# Patient Record
Sex: Female | Born: 2000
Health system: Southern US, Community
[De-identification: ages and names within clinical notes are randomized; demographics above are authoritative.]

## PROBLEM LIST (undated history)

## (undated) DIAGNOSIS — G43909 Migraine, unspecified, not intractable, without status migrainosus: Secondary | ICD-10-CM

## (undated) DIAGNOSIS — R569 Unspecified convulsions: Secondary | ICD-10-CM

## (undated) DIAGNOSIS — N200 Calculus of kidney: Secondary | ICD-10-CM

## (undated) DIAGNOSIS — F32A Depression, unspecified: Secondary | ICD-10-CM

## (undated) DIAGNOSIS — F419 Anxiety disorder, unspecified: Secondary | ICD-10-CM

## (undated) DIAGNOSIS — G259 Extrapyramidal and movement disorder, unspecified: Secondary | ICD-10-CM

## (undated) DIAGNOSIS — I10 Essential (primary) hypertension: Secondary | ICD-10-CM

## (undated) DIAGNOSIS — T7840XA Allergy, unspecified, initial encounter: Secondary | ICD-10-CM

## (undated) DIAGNOSIS — F329 Major depressive disorder, single episode, unspecified: Secondary | ICD-10-CM

## (undated) HISTORY — DX: Extrapyramidal and movement disorder, unspecified: G25.9

## (undated) HISTORY — PX: FOOT SURGERY: SHX648

## (undated) HISTORY — PX: WISDOM TOOTH EXTRACTION: SHX21

## (undated) HISTORY — DX: Calculus of kidney: N20.0

---
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
## 2019-01-14 ENCOUNTER — Other Ambulatory Visit: Payer: Self-pay

## 2019-01-14 ENCOUNTER — Emergency Department (HOSPITAL_COMMUNITY)
Admission: EM | Admit: 2019-01-14 | Discharge: 2019-01-14 | Disposition: A | Discharge status: Home / Self Care | Payer: Medicaid Other | Attending: Emergency Medicine | Admitting: Emergency Medicine

## 2019-01-14 DIAGNOSIS — Z79899 Other long term (current) drug therapy: Secondary | ICD-10-CM | POA: Diagnosis not present

## 2019-01-14 DIAGNOSIS — L551 Sunburn of second degree: Secondary | ICD-10-CM | POA: Diagnosis not present

## 2019-01-14 DIAGNOSIS — I10 Essential (primary) hypertension: Secondary | ICD-10-CM | POA: Diagnosis not present

## 2019-01-14 DIAGNOSIS — Z7722 Contact with and (suspected) exposure to environmental tobacco smoke (acute) (chronic): Secondary | ICD-10-CM | POA: Insufficient documentation

## 2019-01-14 MED ORDER — TRAMADOL HCL 50 MG PO TABS
50.0000 mg | ORAL_TABLET | Freq: Once | ORAL | Status: AC
  Administered 2019-01-14: 50 mg via ORAL
  Filled 2019-01-14: qty 1

## 2019-01-14 MED ORDER — IBUPROFEN 800 MG PO TABS
800.0000 mg | ORAL_TABLET | Freq: Once | ORAL | Status: AC
  Administered 2019-01-14: 800 mg via ORAL
  Filled 2019-01-14: qty 1

## 2019-01-14 MED ORDER — BACITRACIN ZINC 500 UNIT/GM EX OINT: 1.0000 "application " | TOPICAL_OINTMENT | Freq: Two times a day (BID) | CUTANEOUS | 1 refills | Status: DC

## 2019-01-14 NOTE — ED Triage Notes (Addendum)
Per pt she was in the sun on Sunday and Monday and noticed Monday night that her shoulders and her back  were blistered up. Skin is red. Shoulders do have blister on them.

## 2019-01-14 NOTE — ED Provider Notes (Signed)
[facility] EMERGENCY DEPARTMENT Provider Note   CSN: [id number] Arrival date & time: 01/14/19  0021     History   Chief Complaint Chief Complaint  Patient presents with  . Sunburn    HPI Stephanie Frazier is a 18 y.o. female.     18 year old female presents to the emergency department for evaluation of sunburn.  She was in the sun on Sunday and noticed a sunburn to her chest, upper back, shoulders later in the day on Monday.  Her sunburn has been persistently painful.  She describes the discomfort as burning.  It is aggravated with contact from her clothing.  She has been using Excedrin and ibuprofen for pain without significant improvement.  Has also been applying aloe vera consistently.  Denies any drainage from her blisters; no purulence.  No recent fevers.  The history is provided by the patient. No language interpreter was used.    Past Medical History:  Diagnosis Date  . Allergy   . Anxiety   . Hypertension   . Hypertension   . Migraine   . Migraines   . Movement disorder   . Seizures ([place])    last seizure in 2009    Patient Active Problem List   Diagnosis Date Noted  . History of migraine headaches 07/09/2018  . Severe recurrent major depression with psychotic features ([place]) 08/02/2017  . Constipation   . Encounter for nasogastric (NG) tube placement   . Nephrolithiasis   . Abdominal pain 02/12/2017  . Difficulty in walking 07/24/2016  . Migraine without aura and with status migrainosus, not intractable 07/20/2015  . Episodic tension-type headache, not intractable 07/20/2015  . Mood disorder ([place]) 07/20/2015  . Insomnia 07/20/2015  . ADHD (attention deficit hyperactivity disorder) 04/09/2013    Past Surgical History:  Procedure Laterality Date  . BUNIONECTOMY Right 07/23/2016  . BUNIONECTOMY Left 02/2016     OB History   No obstetric history on file.      Home Medications    Prior to Admission medications   Medication Sig  Start Date End Date Taking? Authorizing Provider  bacitracin ointment Apply 1 application topically 2 (two) times daily. Apply to burns as prescribed. 01/14/19   Antonietta Breach, PA-C  Cyanocobalamin (VITAMIN B 12 PO) Take 500 mg by mouth daily.    [provider]  FLUoxetine (PROZAC) 10 MG capsule Take 3 capsules (30 mg total) by mouth at bedtime. 08/11/17   Starkes-Perry, Gayland Curry, FNP  fluticasone (FLONASE) 50 MCG/ACT nasal spray Place 1 spray into both nostrils daily. 07/12/18   Langston Masker B, PA-C  Garlic 1194 MG CAPS Take 1,000 mg by mouth daily.    [provider]  loratadine (CLARITIN) 10 MG tablet TK 1 T PO QD 11/26/17   [provider]  meloxicam (MOBIC) 7.5 MG tablet Take 7.5 mg by mouth at bedtime.  07/23/17   [provider]  meperidine (DEMEROL) 50 MG tablet Take 1 tablet (50 mg total) by mouth 3 (three) times daily as needed for severe pain. 04/09/18   Sheard, Myeong O, DPM  montelukast (SINGULAIR) 10 MG tablet TK 1 T PO QD IN THE EVE 11/26/17   [provider]  Multiple Vitamins-Calcium (ONE-A-DAY WOMENS PO) Take 1 tablet by mouth daily.    [provider]  naproxen (NAPROSYN) 500 MG tablet Take 1 tablet at onset of headache. May repeat x 1 in 8 hours if headache persists 12/10/17   Rockwell Germany, NP  oxymetazoline [place])  0.05 % nasal spray Place 1 spray into both nostrils 2 (two) times daily. 07/12/18   Valere Dross, Alyssa B, PA-C  PATADAY 0.2 % SOLN INT 1 GTT IN [facility] EYE ONCE D FOR 30 DAYS 11/28/17   [provider]  promethazine (PHENERGAN) 12.5 MG tablet Take 1 tablet at onset of nausea. May repeat x1 in 8 hours if nausea persists 04/09/18   Sheard, Myeong O, DPM  traZODone (DESYREL) 100 MG tablet Take 1 tablet (100 mg total) by mouth at bedtime. 08/11/17   Suella Broad, FNP    Family History Family History  Problem Relation Age of Onset  . Seizures Mother   . Depression Mother   . ADD / ADHD Brother   . Depression  Brother   . Migraines Maternal Grandmother   . Depression Maternal Grandmother   . Migraines Maternal Aunt   . Depression Maternal Aunt   . Migraines Maternal Uncle   . ADD / ADHD Cousin        Many Maternal 1st Cousins have [facility]    Social History Social History   Tobacco Use  . Smoking status: Passive Smoke Exposure - Never Smoker  . Smokeless tobacco: Never Used  . Tobacco comment: Parents smoke outside  Substance Use Topics  . Alcohol use: No    Alcohol/week: 0.0 standard drinks  . Drug use: No     Allergies   Morphine and related, Ondansetron hcl, Zofran, and Ketorolac   Review of Systems Review of Systems Ten systems reviewed and are negative for acute change, except as noted in the HPI.    Physical Exam Updated Vital Signs BP (!) 134/98 (BP Location: Right Arm)   Pulse 87   Temp 98.6 F (37 C) (Oral)   Resp 16   SpO2 100%   Physical Exam Vitals signs and nursing note reviewed.  Constitutional:      General: She is not in acute distress.    Appearance: She is well-developed. She is not diaphoretic.     Comments: Nontoxic appearing and in NAD  HENT:     Head: Normocephalic and atraumatic.  Eyes:     General: No scleral icterus.    Conjunctiva/sclera: Conjunctivae normal.  Neck:     Musculoskeletal: Normal range of motion.  Pulmonary:     Effort: Pulmonary effort is normal. No respiratory distress.     Comments: Respirations even and unlabored Musculoskeletal: Normal range of motion.  Skin:    General: Skin is warm and dry.     Coloration: Skin is not pale.     Comments: First degree sunburn to chest, upper back, proximal arms. Sunburn progresses to superficial partial thickness at the tops of bilateral shoulders with blistering. No drainage. Area is TTP.  Neurological:     Mental Status: She is alert and oriented to person, place, and time.  Psychiatric:        Behavior: Behavior normal.      ED Treatments / Results  Labs (all labs ordered  are listed, but only abnormal results are displayed) Labs Reviewed - No data to display  EKG    Radiology No results found.  Procedures Procedures (including critical care time)  Medications Ordered in ED Medications  ibuprofen (ADVIL) tablet 800 mg (has no administration in time range)  traMADol (ULTRAM) tablet 50 mg (has no administration in time range)     Initial Impression / Assessment and Plan / ED Course  I have reviewed the triage vital signs and the  nursing notes.  Pertinent labs & imaging results that were available during my care of the patient were reviewed by me and considered in my medical decision making (see chart for details).        18 year old female presents to the ED for evaluation of a sunburn.  Primarily noted to have first-degree sunburn to chest, upper back, shoulders.  There are small areas of the patient shoulders consistent with second-degree burn and blistering.  No evidence of secondary infection or cellulitis.  Will continue with outpatient supportive management.  Advised bacitracin twice daily.  Encouraged continuation of NSAIDs for pain.  Return precautions discussed and provided. Patient discharged in stable condition with no unaddressed concerns.   Final Clinical Impressions(s) / ED Diagnoses   Final diagnoses:  Sunburn of second degree    ED Discharge Orders         Ordered    bacitracin ointment  2 times daily     01/14/19 0142           Antonietta Breach, PA-C 01/14/19 0152    Merryl Hacker, MD 01/14/19 [phone]

## 2019-01-14 NOTE — ED Notes (Signed)
Patient verbalized understanding of dc instructions, vss, ambulatory with nad.   

## 2019-01-14 NOTE — Discharge Instructions (Signed)
We recommend 600 mg ibuprofen every 6 hours for management of pain.  Apply bacitracin to your burns twice a day.  You would benefit from keeping them covered to try and keep blisters intact.  Apply cool compresses for soothing.  You may also continue use of aloe.  Have your burns checked by your primary care doctor as needed.  Return to the ED for any new or concerning symptoms.

## 2019-05-09 ENCOUNTER — Other Ambulatory Visit: Payer: Self-pay

## 2019-05-09 ENCOUNTER — Emergency Department (HOSPITAL_COMMUNITY): Payer: Self-pay

## 2019-05-09 ENCOUNTER — Emergency Department (HOSPITAL_COMMUNITY)
Admission: EM | Admit: 2019-05-09 | Discharge: 2019-05-09 | Disposition: A | Discharge status: Home / Self Care | Payer: Self-pay | Attending: Emergency Medicine | Admitting: Emergency Medicine

## 2019-05-09 ENCOUNTER — Encounter (HOSPITAL_COMMUNITY): Payer: Self-pay | Admitting: Emergency Medicine

## 2019-05-09 DIAGNOSIS — Z79899 Other long term (current) drug therapy: Secondary | ICD-10-CM | POA: Insufficient documentation

## 2019-05-09 DIAGNOSIS — Z7722 Contact with and (suspected) exposure to environmental tobacco smoke (acute) (chronic): Secondary | ICD-10-CM | POA: Insufficient documentation

## 2019-05-09 DIAGNOSIS — N3001 Acute cystitis with hematuria: Secondary | ICD-10-CM | POA: Insufficient documentation

## 2019-05-09 DIAGNOSIS — I10 Essential (primary) hypertension: Secondary | ICD-10-CM | POA: Insufficient documentation

## 2019-05-09 DIAGNOSIS — R109 Unspecified abdominal pain: Secondary | ICD-10-CM

## 2019-05-09 LAB — URINALYSIS, ROUTINE W REFLEX MICROSCOPIC
Bacteria, UA: NONE SEEN
Bilirubin Urine: NEGATIVE
Glucose, UA: NEGATIVE mg/dL
Ketones, ur: 20 mg/dL — AB
Nitrite: NEGATIVE
Protein, ur: 100 mg/dL — AB
RBC / HPF: >50 RBC/hpf — ABNORMAL HIGH (ref 0–5)
Specific Gravity, Urine: 1.016 (ref 1.005–1.030)
WBC, UA: >50 WBC/hpf — ABNORMAL HIGH (ref 0–5)
pH: 6 (ref 5.0–8.0)

## 2019-05-09 LAB — CBC
HCT: 41.6 % (ref 36.0–46.0)
Hemoglobin: 14 g/dL (ref 12.0–15.0)
MCH: 30.9 pg (ref 26.0–34.0)
MCHC: 33.7 g/dL (ref 30.0–36.0)
MCV: 91.8 fL (ref 80.0–100.0)
Platelets: 202 10*3/uL (ref 150–400)
RBC: 4.53 MIL/uL (ref 3.87–5.11)
RDW: 12.6 % (ref 11.5–15.5)
WBC: 10.4 10*3/uL (ref 4.0–10.5)
nRBC: 0 % (ref 0.0–0.2)

## 2019-05-09 LAB — BASIC METABOLIC PANEL
Anion gap: 10 (ref 5–15)
BUN: 11 mg/dL (ref 6–20)
CO2: 24 mmol/L (ref 22–32)
Calcium: 9.4 mg/dL (ref 8.9–10.3)
Chloride: 102 mmol/L (ref 98–111)
Creatinine, Ser: 0.71 mg/dL (ref 0.44–1.00)
GFR calc Af Amer: >60 mL/min (ref >60)
GFR calc non Af Amer: >60 mL/min (ref >60)
Glucose, Bld: 97 mg/dL (ref 70–99)
Potassium: 4.1 mmol/L (ref 3.5–5.1)
Sodium: 136 mmol/L (ref 135–145)

## 2019-05-09 LAB — I-STAT BETA HCG BLOOD, ED (MC, WL, AP ONLY): I-stat hCG, quantitative: <5 m[IU]/mL (ref <5)

## 2019-05-09 MED ORDER — CEPHALEXIN 500 MG PO CAPS: 500.0000 mg | ORAL_CAPSULE | Freq: Two times a day (BID) | ORAL | 0 refills | Status: AC

## 2019-05-09 MED ORDER — HALOPERIDOL LACTATE 5 MG/ML IJ SOLN
5.0000 mg | Freq: Once | INTRAMUSCULAR | Status: AC
  Administered 2019-05-09: 5 mg via INTRAVENOUS
  Filled 2019-05-09: qty 1

## 2019-05-09 NOTE — ED Triage Notes (Signed)
Patient reports Friday having some blood in her urine and nausea. Woken up by left sided flank pain about 5am today.

## 2019-05-09 NOTE — Discharge Instructions (Addendum)
Take Keflex as prescribed and complete the full course.  Recheck with your primary care provider this week.  Return to ER for new or worsening symptoms.

## 2019-05-09 NOTE — ED Provider Notes (Signed)
[place] EMERGENCY DEPARTMENT Provider Note   CSN: [id number] Arrival date & time: 05/09/19  1146     History   Chief Complaint Chief Complaint  Patient presents with  . Flank Pain    HPI Stephanie Frazier is a 18 y.o. female.     18yo female presents with complaint of left flank pain onset 2 days ago with dysuria and hematuria, now with nausea. Hematuria has resolved.  Patient states pain now radiates down her left leg.  Patient has a history of renal stones, has never passed a kidney stone but was told in 2018 she had a stent in her left kidney.  Denies fevers, chills, abdominal pain, vomiting.  No other complaints or concerns.     Past Medical History:  Diagnosis Date  . Allergy   . Anxiety   . Hypertension   . Hypertension   . Migraine   . Migraines   . Movement disorder   . Seizures ([place])    last seizure in 2009    Patient Active Problem List   Diagnosis Date Noted  . History of migraine headaches 07/09/2018  . Severe recurrent major depression with psychotic features ([place]) 08/02/2017  . Constipation   . Encounter for nasogastric (NG) tube placement   . Nephrolithiasis   . Abdominal pain 02/12/2017  . Difficulty in walking 07/24/2016  . Migraine without aura and with status migrainosus, not intractable 07/20/2015  . Episodic tension-type headache, not intractable 07/20/2015  . Mood disorder ([place]) 07/20/2015  . Insomnia 07/20/2015  . ADHD (attention deficit hyperactivity disorder) 04/09/2013    Past Surgical History:  Procedure Laterality Date  . BUNIONECTOMY Right 07/23/2016  . BUNIONECTOMY Left 02/2016     OB History   No obstetric history on file.      Home Medications    Prior to Admission medications   Medication Sig Start Date End Date Taking? Authorizing Provider  bacitracin ointment Apply 1 application topically 2 (two) times daily. Apply to burns as prescribed. 01/14/19   Antonietta Breach, PA-C  cephALEXin (KEFLEX)  500 MG capsule Take 1 capsule (500 mg total) by mouth 2 (two) times daily for 5 days. 05/09/19 05/14/19  Tacy Learn, PA-C  Cyanocobalamin (VITAMIN B 12 PO) Take 500 mg by mouth daily.    [provider]  FLUoxetine (PROZAC) 10 MG capsule Take 3 capsules (30 mg total) by mouth at bedtime. 08/11/17   Starkes-Perry, Gayland Curry, FNP  fluticasone (FLONASE) 50 MCG/ACT nasal spray Place 1 spray into both nostrils daily. 07/12/18   Langston Masker B, PA-C  Garlic [id number] MG CAPS Take 1,000 mg by mouth daily.    [provider]  loratadine (CLARITIN) 10 MG tablet TK 1 T PO QD 11/26/17   [provider]  meloxicam (MOBIC) 7.5 MG tablet Take 7.5 mg by mouth at bedtime.  07/23/17   [provider]  meperidine (DEMEROL) 50 MG tablet Take 1 tablet (50 mg total) by mouth 3 (three) times daily as needed for severe pain. 04/09/18   Sheard, Myeong O, DPM  montelukast (SINGULAIR) 10 MG tablet TK 1 T PO QD IN THE EVE 11/26/17   [provider]  Multiple Vitamins-Calcium (ONE-A-DAY WOMENS PO) Take 1 tablet by mouth daily.    [provider]  naproxen (NAPROSYN) 500 MG tablet Take 1 tablet at onset of headache. May repeat x 1 in 8 hours if headache persists 12/10/17   Rockwell Germany, NP  oxymetazoline (AFRIN) 0.05 %  nasal spray Place 1 spray into both nostrils 2 (two) times daily. 07/12/18   Valere Dross, Alyssa B, PA-C  PATADAY 0.2 % SOLN INT 1 GTT IN [facility] EYE ONCE D FOR 30 DAYS 11/28/17   [provider]  promethazine (PHENERGAN) 12.5 MG tablet Take 1 tablet at onset of nausea. May repeat x1 in 8 hours if nausea persists 04/09/18   Sheard, Myeong O, DPM  traZODone (DESYREL) 100 MG tablet Take 1 tablet (100 mg total) by mouth at bedtime. 08/11/17   Suella Broad, FNP    Family History Family History  Problem Relation Age of Onset  . Seizures Mother   . Depression Mother   . ADD / ADHD Brother   . Depression Brother   . Migraines Maternal Grandmother   .  Depression Maternal Grandmother   . Migraines Maternal Aunt   . Depression Maternal Aunt   . Migraines Maternal Uncle   . ADD / ADHD Cousin        Many Maternal 1st Cousins have [facility]    Social History Social History   Tobacco Use  . Smoking status: Passive Smoke Exposure - Never Smoker  . Smokeless tobacco: Never Used  . Tobacco comment: Parents smoke outside  Substance Use Topics  . Alcohol use: No    Alcohol/week: 0.0 standard drinks  . Drug use: No     Allergies   Morphine and related, Ondansetron hcl, Zofran, and Ketorolac   Review of Systems Review of Systems  Constitutional: Negative for fever.  Respiratory: Negative for shortness of breath.   Cardiovascular: Negative for chest pain.  Gastrointestinal: Positive for nausea. Negative for abdominal pain, constipation, diarrhea and vomiting.  Genitourinary: Positive for dysuria, flank pain and hematuria. Negative for pelvic pain.  Musculoskeletal: Positive for myalgias. Negative for arthralgias, gait problem and joint swelling.  Skin: Negative for rash and wound.  Allergic/Immunologic: Negative for immunocompromised state.  Neurological: Negative for weakness.  Hematological: Negative for adenopathy.  All other systems reviewed and are negative.    Physical Exam Updated Vital Signs BP (!) 148/92 (BP Location: Right Arm)   Pulse 98   Temp 98.6 F (37 C) (Oral)   Resp 16   Ht 5\' 8"  (1.727 m)   Wt 74.4 kg   LMP 04/24/2019   SpO2 100%   BMI 24.94 kg/m   Physical Exam Vitals signs and nursing note reviewed.  Constitutional:      General: She is not in acute distress.    Appearance: She is well-developed. She is not diaphoretic.  HENT:     Head: Normocephalic and atraumatic.  Cardiovascular:     Rate and Rhythm: Normal rate and regular rhythm.     Pulses: Normal pulses.     Heart sounds: Normal heart sounds.  Pulmonary:     Effort: Pulmonary effort is normal.     Breath sounds: Normal breath sounds.   Abdominal:     Palpations: Abdomen is soft.     Tenderness: There is no abdominal tenderness. There is left CVA tenderness. There is no right CVA tenderness.  Musculoskeletal:        General: Tenderness present.     Thoracic back: She exhibits tenderness. She exhibits no bony tenderness.       Back:  Skin:    General: Skin is warm and dry.     Findings: No erythema or rash.  Neurological:     Mental Status: She is alert and oriented to person, place, and time.  Psychiatric:        Behavior: Behavior normal.      ED Treatments / Results  Labs (all labs ordered are listed, but only abnormal results are displayed) Labs Reviewed  URINALYSIS, ROUTINE W REFLEX MICROSCOPIC - Abnormal; Notable for the following components:      Result Value   APPearance HAZY (*)    Hgb urine dipstick MODERATE (*)    Ketones, ur 20 (*)    Protein, ur 100 (*)    Leukocytes,Ua LARGE (*)    RBC / HPF >50 (*)    WBC, UA >50 (*)    All other components within normal limits  CBC  BASIC METABOLIC PANEL  I-STAT BETA HCG BLOOD, ED ([facility], [facility], [facility] ONLY)    EKG None  Radiology US Renal  Result Date: 05/09/2019 CLINICAL DATA:  18 year old female with acute LEFT flank pain today. EXAM: RENAL / URINARY TRACT ULTRASOUND COMPLETE COMPARISON:  02/12/2017 CT FINDINGS: Right Kidney: Renal measurements: 10.9 x 3.7 x 4.6 cm = volume: 96 mL . Echogenicity within normal limits. No mass or hydronephrosis visualized. Left Kidney: Renal measurements: 10.9 x 5 x 4.7 cm = volume: 121 mL. Echogenicity within normal limits. No mass or hydronephrosis visualized. Bladder: Appears normal for degree of bladder distention. Normal bilateral ureteral jets noted. Other: None IMPRESSION: Unremarkable renal ultrasound. Electronically Signed   By: Margarette Canada M.D.   On: 05/09/2019 14:33    Procedures Procedures (including critical care time)  Medications Ordered in ED Medications  haloperidol lactate (HALDOL) injection 5 mg (5 mg  Intravenous Given 05/09/19 1413)     Initial Impression / Assessment and Plan / ED Course  I have reviewed the triage vital signs and the nursing notes.  Pertinent labs & imaging results that were available during my care of the patient were reviewed by me and considered in my medical decision making (see chart for details).  Clinical Course as of May 08 1453  Sun May 08, 4749  5353 18 year old female presents with complaint of left-sided flank pain x2 days with dysuria and hematuria.  On exam patient has left CVA tenderness, also left lumbar tenderness palpation without midline or bony tenderness.  Abdomen is soft and nontender.  CBC and BMP are unremarkable, hCG negative, urinalysis is positive for hemoglobin, ketones, protein, leukocytes, greater than 50 red blood cells and white blood cells.  Renal ultrasound does not show any hydronephrosis or stones. Patient was given Haldol for her nausea and pain with relief of her symptoms (allergy to Zofran and Toradol as well as morphine).  Patient will be discharged with Keflex for urinary tract infection, recommend recheck with PCP this week, return to ER for new or worsening symptoms.  Patient verbalizes understanding of discharge instructions and plan.   [LM]    Clinical Course User Index [LM] Tacy Learn, PA-C      Final Clinical Impressions(s) / ED Diagnoses   Final diagnoses:  Left flank pain  Acute cystitis with hematuria    ED Discharge Orders         Ordered    cephALEXin (KEFLEX) 500 MG capsule  2 times daily     05/09/19 1444           Roque Lias 05/09/19 1454    Virgel Manifold, MD 05/10/19 1517

## 2019-05-10 ENCOUNTER — Emergency Department (HOSPITAL_COMMUNITY)
Admission: EM | Admit: 2019-05-10 | Discharge: 2019-05-11 | Disposition: A | Discharge status: Home / Self Care | Payer: Self-pay | Attending: Emergency Medicine | Admitting: Emergency Medicine

## 2019-05-10 ENCOUNTER — Other Ambulatory Visit: Payer: Self-pay

## 2019-05-10 ENCOUNTER — Encounter (HOSPITAL_COMMUNITY): Payer: Self-pay | Admitting: *Deleted

## 2019-05-10 DIAGNOSIS — I10 Essential (primary) hypertension: Secondary | ICD-10-CM | POA: Insufficient documentation

## 2019-05-10 DIAGNOSIS — Z7722 Contact with and (suspected) exposure to environmental tobacco smoke (acute) (chronic): Secondary | ICD-10-CM | POA: Insufficient documentation

## 2019-05-10 DIAGNOSIS — M26623 Arthralgia of bilateral temporomandibular joint: Secondary | ICD-10-CM | POA: Insufficient documentation

## 2019-05-10 NOTE — ED Triage Notes (Signed)
Pt reports jaw pain that worsened after taking Keflex. Reports jaw tenses and she's unable to release her jaw to open or close without increased pain. Pt tearful

## 2019-05-11 MED ORDER — FENTANYL CITRATE (PF) 100 MCG/2ML IJ SOLN
50.0000 ug | Freq: Once | INTRAMUSCULAR | Status: AC
  Administered 2019-05-11: 50 ug via INTRAMUSCULAR
  Filled 2019-05-11: qty 2

## 2019-05-11 MED ORDER — DIAZEPAM 5 MG/ML IJ SOLN
5.0000 mg | Freq: Once | INTRAMUSCULAR | Status: AC
  Administered 2019-05-11: 5 mg via INTRAMUSCULAR
  Filled 2019-05-11: qty 2

## 2019-05-11 MED ORDER — CYCLOBENZAPRINE HCL 10 MG PO TABS: 10.0000 mg | ORAL_TABLET | Freq: Two times a day (BID) | ORAL | 0 refills | Status: DC | PRN

## 2019-05-11 MED ORDER — IBUPROFEN 800 MG PO TABS: 800.0000 mg | ORAL_TABLET | Freq: Three times a day (TID) | ORAL | 0 refills | Status: DC

## 2019-05-11 NOTE — Discharge Instructions (Signed)
Take the prescribed medication as directed.  I would continue using warm compresses on the face to help ease tension. Follow-up with your primary care doctor. If you continue having issues with your jaw, you may need to see dentist.  If you do not have one, I have printed information for our dentist on call, Dr. Haig Prophet.  Can call his office if needed. Return to the ED for new or worsening symptoms.

## 2019-05-11 NOTE — ED Notes (Signed)
Pt discharged before waste. Unable to access pt's chart in the pyxis to waste.  Waste witnessed with Rolan Bucco, RN for 82mcg Fentanyl, and 5mg  of Valium.

## 2019-05-11 NOTE — ED Provider Notes (Signed)
[place] EMERGENCY DEPARTMENT Provider Note   CSN: [id number] Arrival date & time: 05/10/19  2228     History   Chief Complaint Chief Complaint  Patient presents with  . Jaw Pain    HPI Stephanie Frazier is a 18 y.o. female.     The history is provided by the patient and medical records.     18 year old female with history of seasonal allergies, anxiety, hypertension, migraine headaches, history of TMJ, presenting to the ED with bilateral jaw pain.  States woke up this morning and her jaw was extremely stiff.  States that is alternating which side is worse than the other.  States she is barely able to open her mouth enough to eat or drink water.  States feels similar to prior episodes of TMJ but has never had it this bad before.  She was recently started on Keflex for urinary symptoms, however is not sure if that is related.  She is not had any fever or chills.  She denies any dental injury or facial trauma.  She has not had any medications at home prior to arrival.  Tetanus vaccine is UTD.  No recent wounds or other infections.  Past Medical History:  Diagnosis Date  . Allergy   . Anxiety   . Hypertension   . Hypertension   . Migraine   . Migraines   . Movement disorder   . Seizures ([place])    last seizure in 2009    Patient Active Problem List   Diagnosis Date Noted  . History of migraine headaches 07/09/2018  . Severe recurrent major depression with psychotic features ([place]) 08/02/2017  . Constipation   . Encounter for nasogastric (NG) tube placement   . Nephrolithiasis   . Abdominal pain 02/12/2017  . Difficulty in walking 07/24/2016  . Migraine without aura and with status migrainosus, not intractable 07/20/2015  . Episodic tension-type headache, not intractable 07/20/2015  . Mood disorder ([place]) 07/20/2015  . Insomnia 07/20/2015  . ADHD (attention deficit hyperactivity disorder) 04/09/2013    Past Surgical History:  Procedure Laterality  Date  . BUNIONECTOMY Right 07/23/2016  . BUNIONECTOMY Left 02/2016     OB History   No obstetric history on file.      Home Medications    Prior to Admission medications   Medication Sig Start Date End Date Taking? Authorizing Provider  bacitracin ointment Apply 1 application topically 2 (two) times daily. Apply to burns as prescribed. 01/14/19   Antonietta Breach, PA-C  cephALEXin (KEFLEX) 500 MG capsule Take 1 capsule (500 mg total) by mouth 2 (two) times daily for 5 days. 05/09/19 05/14/19  Tacy Learn, PA-C  Cyanocobalamin (VITAMIN B 12 PO) Take 500 mg by mouth daily.    [provider]  FLUoxetine (PROZAC) 10 MG capsule Take 3 capsules (30 mg total) by mouth at bedtime. 08/11/17   Starkes-Perry, Gayland Curry, FNP  fluticasone (FLONASE) 50 MCG/ACT nasal spray Place 1 spray into both nostrils daily. 07/12/18   Langston Masker B, PA-C  Garlic [id number] MG CAPS Take 1,000 mg by mouth daily.    [provider]  loratadine (CLARITIN) 10 MG tablet TK 1 T PO QD 11/26/17   [provider]  meloxicam (MOBIC) 7.5 MG tablet Take 7.5 mg by mouth at bedtime.  07/23/17   [provider]  meperidine (DEMEROL) 50 MG tablet Take 1 tablet (50 mg total) by mouth 3 (three) times daily as needed for severe pain. 04/09/18  Sheard, Myeong O, DPM  montelukast (SINGULAIR) 10 MG tablet TK 1 T PO QD IN THE EVE 11/26/17   [provider]  Multiple Vitamins-Calcium (ONE-A-DAY WOMENS PO) Take 1 tablet by mouth daily.    [provider]  naproxen (NAPROSYN) 500 MG tablet Take 1 tablet at onset of headache. May repeat x 1 in 8 hours if headache persists 12/10/17   Rockwell Germany, NP  oxymetazoline (AFRIN) 0.05 % nasal spray Place 1 spray into both nostrils 2 (two) times daily. 07/12/18   Valere Dross, Alyssa B, PA-C  PATADAY 0.2 % SOLN INT 1 GTT IN [facility] EYE ONCE D FOR 30 DAYS 11/28/17   [provider]  promethazine (PHENERGAN) 12.5 MG tablet Take 1 tablet at onset of nausea.  May repeat x1 in 8 hours if nausea persists 04/09/18   Sheard, Myeong O, DPM  traZODone (DESYREL) 100 MG tablet Take 1 tablet (100 mg total) by mouth at bedtime. 08/11/17   Suella Broad, FNP    Family History Family History  Problem Relation Age of Onset  . Seizures Mother   . Depression Mother   . ADD / ADHD Brother   . Depression Brother   . Migraines Maternal Grandmother   . Depression Maternal Grandmother   . Migraines Maternal Aunt   . Depression Maternal Aunt   . Migraines Maternal Uncle   . ADD / ADHD Cousin        Many Maternal 1st Cousins have [facility]    Social History Social History   Tobacco Use  . Smoking status: Passive Smoke Exposure - Never Smoker  . Smokeless tobacco: Never Used  . Tobacco comment: Parents smoke outside  Substance Use Topics  . Alcohol use: No    Alcohol/week: 0.0 standard drinks  . Drug use: No     Allergies   Morphine and related, Ondansetron hcl, Zofran, and Ketorolac   Review of Systems Review of Systems  HENT: Positive for dental problem.   All other systems reviewed and are negative.    Physical Exam Updated Vital Signs BP (!) 167/94 (BP Location: Right Arm)   Pulse (!) 116   Temp 98.4 F (36.9 C) (Oral)   Resp (!) 24   LMP 04/24/2019   SpO2 97%   Physical Exam Vitals signs and nursing note reviewed.  Constitutional:      Appearance: She is well-developed.     Comments: Screaming, crying  HENT:     Head: Normocephalic and atraumatic.     Mouth/Throat:     Comments: Bilateral TMJ tenderness, appears to be holding her jaw shifted to the left, is able to open mouth about 2 finger breadths but there is some popping noted of TMJ when doing so, dentition grossly normal  Opening mouth to scream during exam Eyes:     Conjunctiva/sclera: Conjunctivae normal.     Pupils: Pupils are equal, round, and reactive to light.  Neck:     Musculoskeletal: Normal range of motion.  Cardiovascular:     Rate and Rhythm:  Normal rate and regular rhythm.     Heart sounds: Normal heart sounds.  Pulmonary:     Effort: Pulmonary effort is normal.     Breath sounds: Normal breath sounds.  Abdominal:     General: Bowel sounds are normal.     Palpations: Abdomen is soft.  Musculoskeletal: Normal range of motion.  Skin:    General: Skin is warm and dry.  Neurological:     Mental Status: She  is alert and oriented to person, place, and time.  Psychiatric:     Comments: Extremely anxious appearing      ED Treatments / Results  Labs (all labs ordered are listed, but only abnormal results are displayed) Labs Reviewed - No data to display  EKG None  Radiology  Procedures Procedures (including critical care time)  Medications Ordered in ED Medications  diazepam (VALIUM) injection 5 mg (5 mg Intramuscular Given 05/11/19 0020)  fentaNYL (SUBLIMAZE) injection 50 mcg (50 mcg Intramuscular Given 05/11/19 0020)     Initial Impression / Assessment and Plan / ED Course  I have reviewed the triage vital signs and the nursing notes.  Pertinent labs & imaging results that were available during my care of the patient were reviewed by me and considered in my medical decision making (see chart for details).  18 year old female presenting to the ED with bilateral jaw pain.  States this began this morning upon waking, states severe pain alternates from side to side.  History of TMJ which felt similar but never this severe.  She is afebrile and nontoxic but is screaming and crying on exam.  She is holding her jaw with mandible shifted slightly to the left.  She is able to open her mouth at least 2 finger breaths and when screaming.  She is not having any difficulty swallowing.  She does have tenderness along the TMJ joints bilaterally.  She was recently started on Keflex, however I do not feel this is associated with her symptoms.  Her tetanus is up-to-date she has not had any recent atypical wounds.  Her behavior is  somewhat exaggerated here with a lot of noted anxiety which may be exacerbating her symptoms.  She was given dose of Valium and fentanyl, warm compresses applied to the face.  This seemed to help control her pain and she is no longer screaming or crying.  Feel she is stable for discharge home, will start on course of Flexeril and anti-inflammatories.  She will need to follow-up with her PCP.  Also given information for dentist for follow-up if ongoing jaw pain.  Return here for any new/acute changes.  Final Clinical Impressions(s) / ED Diagnoses   Final diagnoses:  Bilateral temporomandibular joint pain    ED Discharge Orders         Ordered    cyclobenzaprine (FLEXERIL) 10 MG tablet  2 times daily PRN     05/11/19 0043    ibuprofen (ADVIL) 800 MG tablet  3 times daily     05/11/19 0043           Larene Pickett, PA-C 05/11/19 0106    Ward, Delice Bison, DO 05/11/19 [phone]

## 2019-07-03 IMAGING — DX DG ABD PORTABLE 1V
1 series · 1 of 1 positions shown · non-contrast
Comparison: 02/12/2017 CT

CLINICAL DATA: Nausea, vomiting.

EXAM:
PORTABLE ABDOMEN - 1 VIEW

[abdomen]
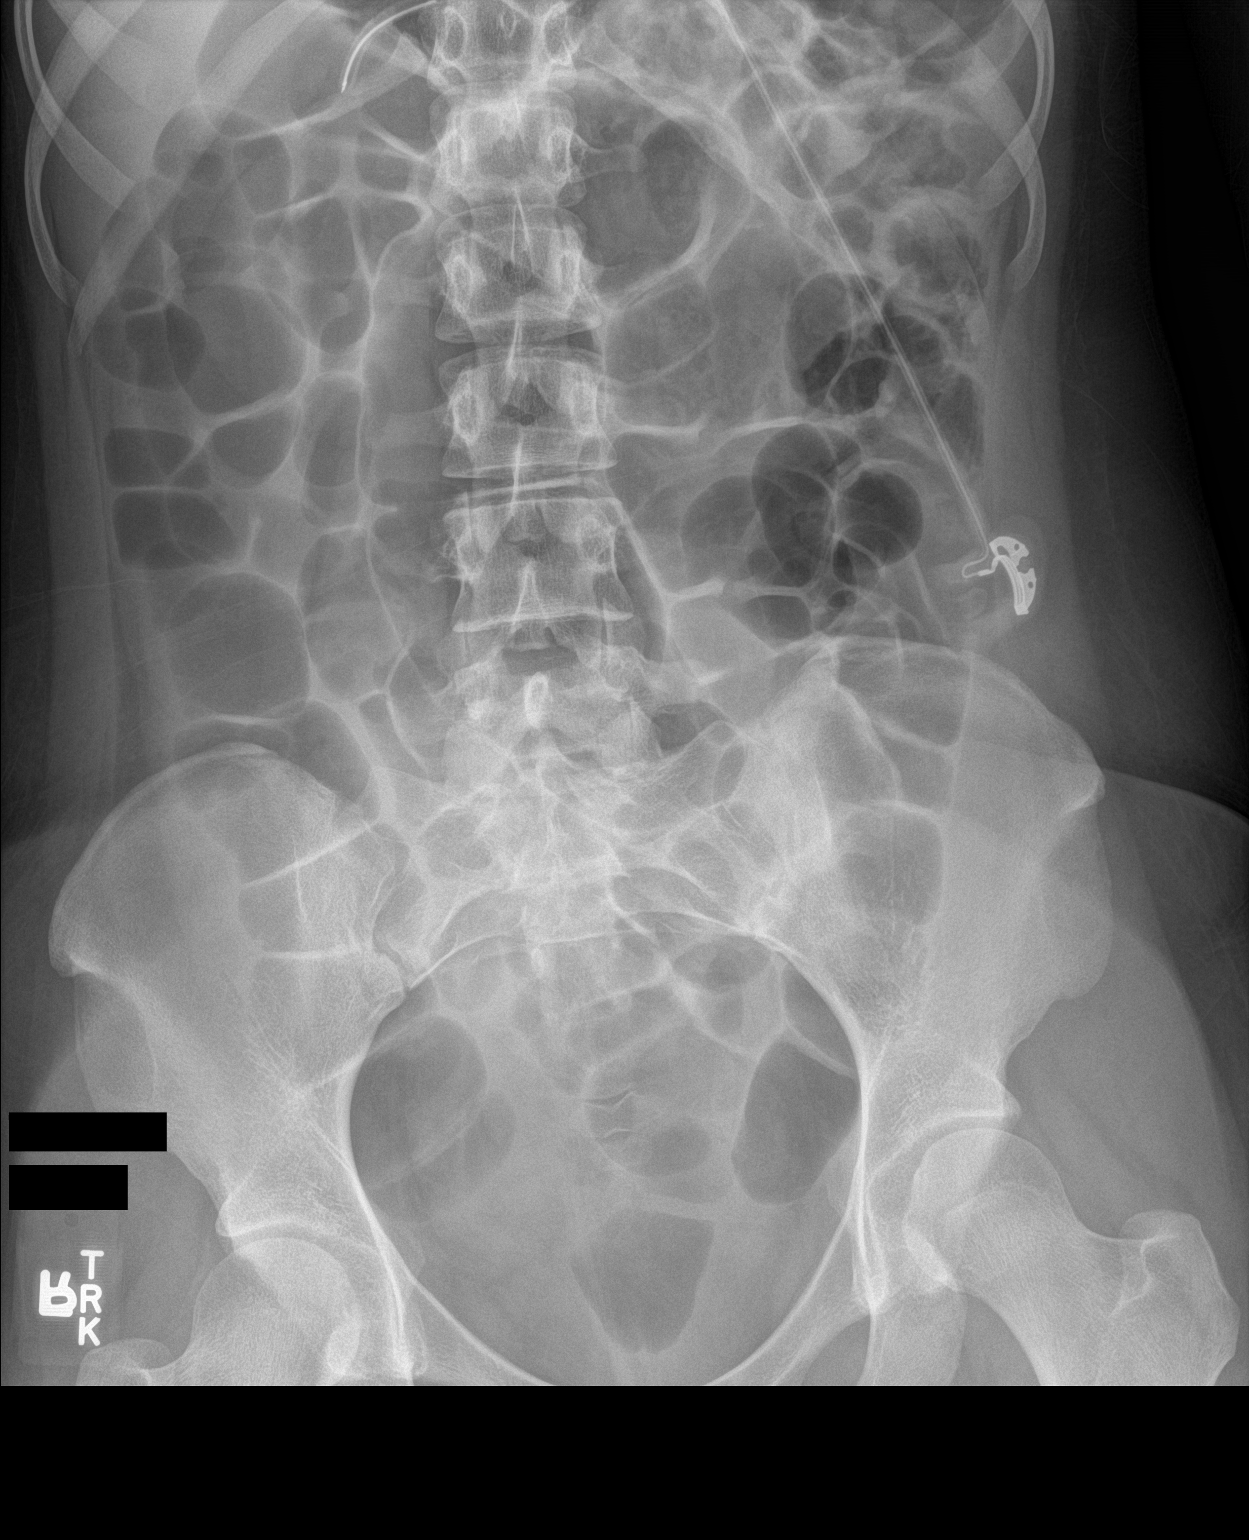

[1 of 1 positions shown; findings below may reference images not displayed]

FINDINGS: Diffuse gaseous distention of bowel. Tip of the NG tube is in the
distal stomach or proximal duodenum. No organomegaly or free air.
IMPRESSION: Diffuse gaseous distention of bowel.

## 2019-07-23 NOTE — L&D Delivery Note (Signed)
[facility]N [facility] Delivery Note  Stephanie Frazier is a 19 y.o. G1P0 s/p SVD at [redacted]w[redacted]d. She was admitted for IOL for gHTN.   ROM: 5h 9m with clear fluid GBS Status: Negative Maximum Maternal Temperature: 99.3  Labor Progress: . Admitted for IOL for gHTN, initial cervical check 1.5/50/-3. FB and buccal cytotec placed, FB out 1350. Pitocin started, AROM 1809. Fetus had early decels after AROM that resolved with amnioinfusion, terbutaline, and pitocin break. Pitocin restarted and patient progressed to complete at 2325.  Delivery Date/Time: 02/29/20 2325 Delivery: Called to room and patient was complete and pushing. Fetus noted to have recurrent variable decelerations to 60s while pushing with good recovery back to baseline, NICU team alerted and called to delivery room. Head delivered ROA. Tight nuchal cord present x 1, reduced. Shoulder and body delivered in usual fashion. Infant with spontaneous cry, placed on mother's abdomen, dried and stimulated. Cord clamped x 2 after 1-minute delay, and cut by grandmother under my direct supervision. Cord blood drawn. Placenta delivered spontaneously with gentle cord traction. Fundus firm with massage and Pitocin. Labia, perineum, vagina, and cervix were inspected. Left labial tear present, repaired with 4-0 vicryl in the usual fashion. Good hemostasis noted.   Placenta: Intact, 3 vessel cord, delivered spontaneously Complications: None Lacerations: Left labial, repaired EBL: 150 Analgesia: Epidural  Infant: Girl  APGARs 7 and 9  Weight pending   Wende Mott, MD Family Medicine PGY-3

## 2019-10-23 ENCOUNTER — Emergency Department (HOSPITAL_BASED_OUTPATIENT_CLINIC_OR_DEPARTMENT_OTHER): Payer: Medicaid Other

## 2019-10-23 ENCOUNTER — Other Ambulatory Visit: Payer: Self-pay

## 2019-10-23 ENCOUNTER — Inpatient Hospital Stay (HOSPITAL_BASED_OUTPATIENT_CLINIC_OR_DEPARTMENT_OTHER)
Admission: EM | Admit: 2019-10-23 | Discharge: 2019-10-25 | DRG: 832 | Disposition: A | Discharge status: Home / Self Care | Payer: Medicaid Other | Attending: Obstetrics and Gynecology | Admitting: Obstetrics and Gynecology

## 2019-10-23 ENCOUNTER — Encounter (HOSPITAL_BASED_OUTPATIENT_CLINIC_OR_DEPARTMENT_OTHER): Payer: Self-pay | Admitting: Emergency Medicine

## 2019-10-23 DIAGNOSIS — R109 Unspecified abdominal pain: Secondary | ICD-10-CM | POA: Diagnosis present

## 2019-10-23 DIAGNOSIS — Z87442 Personal history of urinary calculi: Secondary | ICD-10-CM | POA: Diagnosis not present

## 2019-10-23 DIAGNOSIS — Z3A21 21 weeks gestation of pregnancy: Secondary | ICD-10-CM

## 2019-10-23 DIAGNOSIS — R319 Hematuria, unspecified: Secondary | ICD-10-CM | POA: Diagnosis not present

## 2019-10-23 DIAGNOSIS — N390 Urinary tract infection, site not specified: Secondary | ICD-10-CM

## 2019-10-23 DIAGNOSIS — Z20822 Contact with and (suspected) exposure to covid-19: Secondary | ICD-10-CM | POA: Diagnosis present

## 2019-10-23 DIAGNOSIS — O26832 Pregnancy related renal disease, second trimester: Principal | ICD-10-CM | POA: Diagnosis present

## 2019-10-23 DIAGNOSIS — N23 Unspecified renal colic: Secondary | ICD-10-CM | POA: Diagnosis not present

## 2019-10-23 DIAGNOSIS — O99891 Other specified diseases and conditions complicating pregnancy: Secondary | ICD-10-CM | POA: Diagnosis not present

## 2019-10-23 DIAGNOSIS — O0932 Supervision of pregnancy with insufficient antenatal care, second trimester: Secondary | ICD-10-CM | POA: Diagnosis not present

## 2019-10-23 DIAGNOSIS — N200 Calculus of kidney: Secondary | ICD-10-CM | POA: Diagnosis present

## 2019-10-23 DIAGNOSIS — N202 Calculus of kidney with calculus of ureter: Secondary | ICD-10-CM | POA: Diagnosis present

## 2019-10-23 DIAGNOSIS — Z23 Encounter for immunization: Secondary | ICD-10-CM | POA: Diagnosis not present

## 2019-10-23 DIAGNOSIS — Z3490 Encounter for supervision of normal pregnancy, unspecified, unspecified trimester: Secondary | ICD-10-CM

## 2019-10-23 HISTORY — DX: Depression, unspecified: F32.A

## 2019-10-23 LAB — CBC WITH DIFFERENTIAL/PLATELET
Abs Immature Granulocytes: 0.04 10*3/uL (ref 0.00–0.07)
Basophils Absolute: 0 10*3/uL (ref 0.0–0.1)
Basophils Relative: 0 %
Eosinophils Absolute: 0 10*3/uL (ref 0.0–0.5)
Eosinophils Relative: 0 %
HCT: 35 % — ABNORMAL LOW (ref 36.0–46.0)
Hemoglobin: 12 g/dL (ref 12.0–15.0)
Immature Granulocytes: 0 %
Lymphocytes Relative: 13 %
Lymphs Abs: 1.5 10*3/uL (ref 0.7–4.0)
MCH: 31.6 pg (ref 26.0–34.0)
MCHC: 34.3 g/dL (ref 30.0–36.0)
MCV: 92.1 fL (ref 80.0–100.0)
Monocytes Absolute: 0.5 10*3/uL (ref 0.1–1.0)
Monocytes Relative: 4 %
Neutro Abs: 9.5 10*3/uL — ABNORMAL HIGH (ref 1.7–7.7)
Neutrophils Relative %: 83 %
Platelets: 178 10*3/uL (ref 150–400)
RBC: 3.8 MIL/uL — ABNORMAL LOW (ref 3.87–5.11)
RDW: 13.4 % (ref 11.5–15.5)
WBC: 11.5 10*3/uL — ABNORMAL HIGH (ref 4.0–10.5)
nRBC: 0 % (ref 0.0–0.2)

## 2019-10-23 LAB — COMPREHENSIVE METABOLIC PANEL
ALT: 12 U/L (ref 0–44)
AST: 15 U/L (ref 15–41)
Albumin: 3.2 g/dL — ABNORMAL LOW (ref 3.5–5.0)
Alkaline Phosphatase: 49 U/L (ref 38–126)
Anion gap: 10 (ref 5–15)
BUN: 9 mg/dL (ref 6–20)
CO2: 24 mmol/L (ref 22–32)
Calcium: 8.7 mg/dL — ABNORMAL LOW (ref 8.9–10.3)
Chloride: 102 mmol/L (ref 98–111)
Creatinine, Ser: 0.56 mg/dL (ref 0.44–1.00)
GFR calc Af Amer: >60 mL/min (ref >60)
GFR calc non Af Amer: >60 mL/min (ref >60)
Glucose, Bld: 88 mg/dL (ref 70–99)
Potassium: 3.6 mmol/L (ref 3.5–5.1)
Sodium: 136 mmol/L (ref 135–145)
Total Bilirubin: 0.5 mg/dL (ref 0.3–1.2)
Total Protein: 6.5 g/dL (ref 6.5–8.1)

## 2019-10-23 LAB — URINALYSIS, MICROSCOPIC (REFLEX)
RBC / HPF: >50 RBC/hpf (ref 0–5)
WBC, UA: >50 WBC/hpf (ref 0–5)

## 2019-10-23 LAB — SARS CORONAVIRUS 2 (TAT 6-24 HRS): SARS Coronavirus 2: NEGATIVE

## 2019-10-23 LAB — URINALYSIS, ROUTINE W REFLEX MICROSCOPIC
Bilirubin Urine: NEGATIVE
Glucose, UA: NEGATIVE mg/dL
Ketones, ur: NEGATIVE mg/dL
Nitrite: NEGATIVE
Protein, ur: 30 mg/dL — AB
Specific Gravity, Urine: 1.02 (ref 1.005–1.030)
pH: 6 (ref 5.0–8.0)

## 2019-10-23 MED ORDER — PRENATAL MULTIVITAMIN CH
1.0000 | ORAL_TABLET | Freq: Every day | ORAL | Status: DC
  Administered 2019-10-24 – 2019-10-25 (×2): 1 via ORAL
  Filled 2019-10-23 (×2): qty 1

## 2019-10-23 MED ORDER — SODIUM CHLORIDE 0.9 % IV SOLN: INTRAVENOUS | Status: DC | PRN

## 2019-10-23 MED ORDER — SODIUM CHLORIDE 0.9 % IV SOLN
INTRAVENOUS | Status: DC | PRN
  Administered 2019-10-23: 250 mL via INTRAVENOUS

## 2019-10-23 MED ORDER — ACETAMINOPHEN 325 MG PO TABS
650.0000 mg | ORAL_TABLET | ORAL | Status: DC | PRN
  Administered 2019-10-23 – 2019-10-25 (×5): 650 mg via ORAL
  Filled 2019-10-23 (×5): qty 2

## 2019-10-23 MED ORDER — SODIUM CHLORIDE 0.9 % IV SOLN
1.0000 g | Freq: Once | INTRAVENOUS | Status: AC
  Administered 2019-10-23: 1 g via INTRAVENOUS
  Filled 2019-10-23: qty 10

## 2019-10-23 MED ORDER — SODIUM CHLORIDE 0.9% FLUSH: 3.0000 mL | INTRAVENOUS | Status: DC | PRN

## 2019-10-23 MED ORDER — ZOLPIDEM TARTRATE 5 MG PO TABS: 5.0000 mg | ORAL_TABLET | Freq: Every evening | ORAL | Status: DC | PRN

## 2019-10-23 MED ORDER — FENTANYL CITRATE (PF) 100 MCG/2ML IJ SOLN
50.0000 ug | Freq: Once | INTRAMUSCULAR | Status: AC
  Administered 2019-10-23: 50 ug via INTRAVENOUS
  Filled 2019-10-23: qty 2

## 2019-10-23 MED ORDER — CALCIUM CARBONATE ANTACID 500 MG PO CHEW: 2.0000 | CHEWABLE_TABLET | ORAL | Status: DC | PRN

## 2019-10-23 MED ORDER — SODIUM CHLORIDE 0.9 % IV SOLN: 250.0000 mL | INTRAVENOUS | Status: DC | PRN

## 2019-10-23 MED ORDER — SODIUM CHLORIDE 0.9% FLUSH
3.0000 mL | Freq: Two times a day (BID) | INTRAVENOUS | Status: DC
  Administered 2019-10-23 – 2019-10-25 (×4): 3 mL via INTRAVENOUS

## 2019-10-23 MED ORDER — INFLUENZA VAC SPLIT QUAD 0.5 ML IM SUSY
0.5000 mL | PREFILLED_SYRINGE | INTRAMUSCULAR | Status: AC
  Administered 2019-10-24: 0.5 mL via INTRAMUSCULAR
  Filled 2019-10-23: qty 0.5

## 2019-10-23 MED ORDER — FENTANYL CITRATE (PF) 100 MCG/2ML IJ SOLN
50.0000 ug | INTRAMUSCULAR | Status: DC | PRN
  Administered 2019-10-23 (×2): 50 ug via INTRAVENOUS
  Filled 2019-10-23 (×2): qty 2

## 2019-10-23 MED ORDER — DOCUSATE SODIUM 100 MG PO CAPS
100.0000 mg | ORAL_CAPSULE | Freq: Every day | ORAL | Status: DC
  Filled 2019-10-23: qty 1

## 2019-10-23 NOTE — ED Notes (Signed)
Patient transported to Ultrasound 

## 2019-10-23 NOTE — ED Provider Notes (Signed)
[facility] EMERGENCY DEPARTMENT Provider Note   CSN: [id number] Arrival date & time: 10/23/19  [id number]     History Chief Complaint  Patient presents with  . Flank Pain    Cassara C. Maberry is a 19 y.o. female.  Patient is a 19 year old female who presents with flank pain.  She is 5 months pregnant.  She complains of pain in her right back.  She has no associated abdominal pain.  No dysuria/pain on urination.  No fevers.  She has had some nausea but no vomiting.  She said her symptoms a started about 3 days ago.  Has been fairly constant.  She had some similar symptoms in the past with a UTI.  She had some leftover Keflex at home and she is taken about 4 of the pills.  She denies any blood in her urine.  No known fevers.  She has not yet established prenatal care and is waiting for her Medicaid approval.  She has not really felt the baby move because this is her first pregnancy and she does not really know what it feels like.        Past Medical History:  Diagnosis Date  . Allergy   . Anxiety   . Hypertension   . Hypertension   . Migraine   . Migraines   . Movement disorder   . Seizures ([place])    last seizure in 2009    Patient Active Problem List   Diagnosis Date Noted  . History of migraine headaches 07/09/2018  . Severe recurrent major depression with psychotic features ([place]) 08/02/2017  . Constipation   . Encounter for nasogastric (NG) tube placement   . Nephrolithiasis   . Abdominal pain 02/12/2017  . Difficulty in walking 07/24/2016  . Migraine without aura and with status migrainosus, not intractable 07/20/2015  . Episodic tension-type headache, not intractable 07/20/2015  . Mood disorder ([place]) 07/20/2015  . Insomnia 07/20/2015  . ADHD (attention deficit hyperactivity disorder) 04/09/2013    Past Surgical History:  Procedure Laterality Date  . BUNIONECTOMY Right 07/23/2016  . BUNIONECTOMY Left 02/2016  . FOOT SURGERY       OB History    Gravida    1   Para      Term      Preterm      AB      Living        SAB      TAB      Ectopic      Multiple      Live Births              Family History  Problem Relation Age of Onset  . Seizures Mother   . Depression Mother   . ADD / ADHD Brother   . Depression Brother   . Migraines Maternal Grandmother   . Depression Maternal Grandmother   . Migraines Maternal Aunt   . Depression Maternal Aunt   . Migraines Maternal Uncle   . ADD / ADHD Cousin        Many Maternal 1st Cousins have [facility]    Social History   Tobacco Use  . Smoking status: Passive Smoke Exposure - Never Smoker  . Smokeless tobacco: Never Used  . Tobacco comment: Parents smoke outside  Substance Use Topics  . Alcohol use: No    Alcohol/week: 0.0 standard drinks  . Drug use: No    Home Medications Prior to Admission medications   Medication Sig Start Date  End Date Taking? Authorizing Provider  bacitracin ointment Apply 1 application topically 2 (two) times daily. Apply to burns as prescribed. 01/14/19   Antonietta Breach, PA-C  Cyanocobalamin (VITAMIN B 12 PO) Take 500 mg by mouth daily.    [provider]  cyclobenzaprine (FLEXERIL) 10 MG tablet Take 1 tablet (10 mg total) by mouth 2 (two) times daily as needed for muscle spasms. 05/11/19   Larene Pickett, PA-C  FLUoxetine (PROZAC) 10 MG capsule Take 3 capsules (30 mg total) by mouth at bedtime. 08/11/17   Starkes-Perry, Gayland Curry, FNP  fluticasone (FLONASE) 50 MCG/ACT nasal spray Place 1 spray into both nostrils daily. 07/12/18   Langston Masker B, PA-C  Garlic [id number] MG CAPS Take 1,000 mg by mouth daily.    [provider]  ibuprofen (ADVIL) 800 MG tablet Take 1 tablet (800 mg total) by mouth 3 (three) times daily. 05/11/19   Larene Pickett, PA-C  loratadine (CLARITIN) 10 MG tablet TK 1 T PO QD 11/26/17   [provider]  meloxicam (MOBIC) 7.5 MG tablet Take 7.5 mg by mouth at bedtime.  07/23/17   [provider]   meperidine (DEMEROL) 50 MG tablet Take 1 tablet (50 mg total) by mouth 3 (three) times daily as needed for severe pain. 04/09/18   Sheard, Myeong O, DPM  montelukast (SINGULAIR) 10 MG tablet TK 1 T PO QD IN THE EVE 11/26/17   [provider]  Multiple Vitamins-Calcium (ONE-A-DAY WOMENS PO) Take 1 tablet by mouth daily.    [provider]  naproxen (NAPROSYN) 500 MG tablet Take 1 tablet at onset of headache. May repeat x 1 in 8 hours if headache persists 12/10/17   Rockwell Germany, NP  oxymetazoline (AFRIN) 0.05 % nasal spray Place 1 spray into both nostrils 2 (two) times daily. 07/12/18   Valere Dross, Alyssa B, PA-C  PATADAY 0.2 % SOLN INT 1 GTT IN [name] EYE ONCE D FOR 30 DAYS 11/28/17   [provider]  promethazine (PHENERGAN) 12.5 MG tablet Take 1 tablet at onset of nausea. May repeat x1 in 8 hours if nausea persists 04/09/18   Sheard, Myeong O, DPM  traZODone (DESYREL) 100 MG tablet Take 1 tablet (100 mg total) by mouth at bedtime. 08/11/17   Suella Broad, FNP    Allergies    Morphine and related, Ondansetron hcl, Zofran, and Ketorolac  Review of Systems   Review of Systems  Constitutional: Negative for chills, diaphoresis, fatigue and fever.  HENT: Negative for congestion, rhinorrhea and sneezing.   Eyes: Negative.   Respiratory: Negative for cough, chest tightness and shortness of breath.   Cardiovascular: Negative for chest pain and leg swelling.  Gastrointestinal: Positive for nausea. Negative for abdominal pain, blood in stool, diarrhea and vomiting.  Genitourinary: Negative for difficulty urinating, flank pain, frequency and hematuria.  Musculoskeletal: Positive for back pain. Negative for arthralgias.  Skin: Negative for rash.  Neurological: Negative for dizziness, speech difficulty, weakness, numbness and headaches.    Physical Exam Updated Vital Signs BP 123/75   Pulse 72   Temp 98.6 F (37 C) (Oral)   Resp 15   Ht 5\' 7"  (1.702 m)   Wt 74.4  kg   LMP 04/24/2019   SpO2 99%   BMI 25.69 kg/m   Physical Exam Constitutional:      Appearance: She is well-developed.  HENT:     Head: Normocephalic and atraumatic.  Eyes:     Pupils: Pupils are equal, round, and  reactive to light.  Cardiovascular:     Rate and Rhythm: Normal rate and regular rhythm.     Heart sounds: Normal heart sounds.  Pulmonary:     Effort: Pulmonary effort is normal. No respiratory distress.     Breath sounds: Normal breath sounds. No wheezing or rales.  Chest:     Chest wall: No tenderness.  Abdominal:     General: Bowel sounds are normal.     Palpations: Abdomen is soft.     Tenderness: There is no abdominal tenderness. There is no guarding or rebound.     Comments: Gravid uterus with fundus at the umbilicus, +right CVA tenderness  Musculoskeletal:        General: Normal range of motion.     Cervical back: Normal range of motion and neck supple.  Lymphadenopathy:     Cervical: No cervical adenopathy.  Skin:    General: Skin is warm and dry.     Findings: No rash.  Neurological:     Mental Status: She is alert and oriented to person, place, and time.     ED Results / Procedures / Treatments   Labs (all labs ordered are listed, but only abnormal results are displayed) Labs Reviewed  URINALYSIS, ROUTINE W REFLEX MICROSCOPIC - Abnormal; Notable for the following components:      Result Value   APPearance CLOUDY (*)    Hgb urine dipstick LARGE (*)    Protein, ur 30 (*)    Leukocytes,Ua SMALL (*)    All other components within normal limits  CBC WITH DIFFERENTIAL/PLATELET - Abnormal; Notable for the following components:   WBC 11.5 (*)    RBC 3.80 (*)    HCT 35.0 (*)    Neutro Abs 9.5 (*)    All other components within normal limits  COMPREHENSIVE METABOLIC PANEL - Abnormal; Notable for the following components:   Calcium 8.7 (*)    Albumin 3.2 (*)    All other components within normal limits  URINALYSIS, MICROSCOPIC (REFLEX) -  Abnormal; Notable for the following components:   Bacteria, UA MANY (*)    Non Squamous Epithelial PRESENT (*)    All other components within normal limits  URINE CULTURE  SARS CORONAVIRUS 2 (TAT 6-24 HRS)    EKG None  Radiology US Renal  Result Date: 10/23/2019 CLINICAL DATA:  RIGHT flank pain. Five months pregnant. History of renal stones. EXAM: RENAL / URINARY TRACT ULTRASOUND COMPLETE COMPARISON:  CT abdomen dated 02/12/2017. FINDINGS: Right Kidney: Renal measurements: 12 x 4.3 x 5.2 cm = volume: 139 mL. Cortical echogenicity is within normal limits. Hydronephrosis, mild to moderate in degree. Left Kidney: Renal measurements: 11.2 x 4.9 x 4.5 cm = volume: 132 mL. Cortical echogenicity is within normal limits. No hydronephrosis. Bladder: Bladder appears normal. Both distal ureters are shown to be patent at the level of the bladder (bilateral ureteral jets are visualized). Other: None. IMPRESSION: 1. RIGHT hydronephrosis, mild to moderate in degree. This may be physiologic related to the gravid uterus. 2. Both distal ureters are shown to be patent at the level of the bladder (bilateral ureteral jets visualized). Electronically Signed   By: Franki Cabot M.D.   On: 10/23/2019 10:00   US Abdomen Limited RUQ  Result Date: 10/23/2019 CLINICAL DATA:  Right upper quadrant/right flank pain EXAM: ULTRASOUND ABDOMEN LIMITED RIGHT UPPER QUADRANT COMPARISON:  Renal ultrasound May 09, 2019 FINDINGS: Gallbladder: No gallstones or wall thickening visualized. There is no pericholecystic fluid. No sonographic Murphy sign  noted by sonographer. Common bile duct: Diameter: 4 mm. No intrahepatic or extrahepatic biliary duct dilatation. Liver: No focal lesion identified. Within normal limits in parenchymal echogenicity. Portal vein is patent on color Doppler imaging with normal direction of blood flow towards the liver. Other: There is fullness of the right renal collecting system. No appreciable ureterectasis.  IMPRESSION: Fullness of the right renal collecting system without site of obstruction noted. Question fullness secondary to known intrauterine gestation. Study otherwise unremarkable. Electronically Signed   By: Lowella Grip III M.D.   On: 10/23/2019 09:43    Procedures Procedures (including critical care time)  Medications Ordered in ED Medications  fentaNYL (SUBLIMAZE) injection 50 mcg (50 mcg Intravenous Given 10/23/19 0818)  fentaNYL (SUBLIMAZE) injection 50 mcg (50 mcg Intravenous Given 10/23/19 1049)    ED Course  I have reviewed the triage vital signs and the nursing notes.  Pertinent labs & imaging results that were available during my care of the patient were reviewed by me and considered in my medical decision making (see chart for details).    MDM Rules/Calculators/A&P                      Patient is a 19 year old female who is [redacted] weeks pregnant who presents with right flank pain.  She is G1, P0.  Fetal heart tones are in the 150s.  She had a urinalysis which shows some signs of infection as well as hematuria.  She has mild elevation in her WBC count but no fever, tachycardia or other suggestions of sepsis.  Her urine was sent for culture.  Given the hematuria, renal ultrasound was performed which shows severe hydro on the right.  This could be related to a kidney stone versus pressure from the gravid uterus.  I spoke with Dr. Diona Fanti with urology who recommends admission to the [facility] hospital with urology consulting.  He may need to do intervention versus MRI if her symptoms persist.  She was given a dose of Rocephin in the ED.  I spoke with Dr. Kennon Rounds, the OB/GYN on-call who is excepted the patient for [facility] the [facility]. Final Clinical Impression(s) / ED Diagnoses Final diagnoses:  Flank pain  Renal colic  Urinary tract infection with hematuria, site unspecified    Rx / DC Orders ED Discharge Orders    None       Malvin Johns, MD 10/23/19  1121

## 2019-10-23 NOTE — ED Triage Notes (Signed)
Pt states she is pregnant and thinks she may have a kidney infection  Pt is c/o pain in her right flank area that started on Wednesday  Denies dysuria or any other urinary problems  Pt is having nausea without vomiting  Pt is due 03-04-2020

## 2019-10-23 NOTE — H&P (Signed)
Stephanie Frazier is an 19 y.o. G1P0 female.   Chief Complaint: right sided flank pain HPI: Patient with limited [place]. Has had early u/s [facility]. She was given due date based on this. She reports 3 day h/o right sided flank pain, worsened acutely this am. Has h/o kidney stones. Presented to [facility] ED and renal u/s shows mild to moderate hydronephrosis with bilateral jets. Given Fentanyl which did not adequately control her pain. Asked to admit by Urology. Has h/o kidney infection and began taking Abx at home for 4 doses.  Past Medical History:  Diagnosis Date  . Allergy   . Anxiety   . Depression   . Hypertension   . Hypertension   . Migraine   . Migraines   . Movement disorder   . Seizures ([place])    last seizure in 2009    Past Surgical History:  Procedure Laterality Date  . BUNIONECTOMY Right 07/23/2016  . BUNIONECTOMY Left 02/2016  . FOOT SURGERY      Family History  Problem Relation Age of Onset  . Seizures Mother   . Depression Mother   . ADD / ADHD Brother   . Depression Brother   . Migraines Maternal Grandmother   . Depression Maternal Grandmother   . Migraines Maternal Aunt   . Depression Maternal Aunt   . Migraines Maternal Uncle   . ADD / ADHD Cousin        Many Maternal 1st Cousins have [facility]   Social History:  reports that she is a non-smoker but has been exposed to tobacco smoke. She has never used smokeless tobacco. She reports that she does not drink alcohol or use drugs.  Allergies:  Allergies  Allergen Reactions  . Morphine And Related Shortness Of Breath and Other (See Comments)    Headache and "trouble breathing"  . Ondansetron Hcl Rash  . Zofran Rash  . Ketorolac Rash    Medications Prior to Admission  Medication Sig Dispense Refill  . bacitracin ointment Apply 1 application topically 2 (two) times daily. Apply to burns as prescribed. 15 g 1  . Cyanocobalamin (VITAMIN B 12 PO) Take 500 mg by mouth daily.    Marland Kitchen FLUoxetine (PROZAC) 10  MG capsule Take 3 capsules (30 mg total) by mouth at bedtime. 90 capsule 0  . fluticasone (FLONASE) 50 MCG/ACT nasal spray Place 1 spray into both nostrils daily. 16 g 2  . Garlic [id number] MG CAPS Take 1,000 mg by mouth daily.    Marland Kitchen loratadine (CLARITIN) 10 MG tablet TK 1 T PO QD  5  . montelukast (SINGULAIR) 10 MG tablet TK 1 T PO QD IN THE EVE  5  . Multiple Vitamins-Calcium (ONE-A-DAY WOMENS PO) Take 1 tablet by mouth daily.    Marland Kitchen oxymetazoline (AFRIN) 0.05 % nasal spray Place 1 spray into both nostrils 2 (two) times daily. 30 mL 0  . PATADAY 0.2 % SOLN INT 1 GTT IN EACH EYE ONCE D FOR 30 DAYS  3  . promethazine (PHENERGAN) 12.5 MG tablet Take 1 tablet at onset of nausea. May repeat x1 in 8 hours if nausea persists 21 tablet 0  . traZODone (DESYREL) 100 MG tablet Take 1 tablet (100 mg total) by mouth at bedtime. 30 tablet 0    A comprehensive review of systems was negative.  Blood pressure 116/69, pulse 83, temperature 98.2 F (36.8 C), temperature source Oral, resp. rate 16, height 5\' 7"  (1.702 m), weight 74.4 kg, last menstrual period  04/24/2019, SpO2 99 %. BP 116/69 (BP Location: Left Arm)   Pulse 83   Temp 98.2 F (36.8 C) (Oral)   Resp 16   Ht 5\' 7"  (1.702 m)   Wt 74.4 kg   LMP 04/24/2019   SpO2 99%   BMI 25.69 kg/m  General appearance: alert, cooperative and appears stated age Head: Normocephalic, without obvious abnormality, atraumatic Neck: supple, symmetrical, trachea midline Back: symmetric, no curvature. ROM normal. No CVA tenderness. Lungs: clear to auscultation bilaterally Heart: regular rate and rhythm Abdomen: gravid, non-tender Extremities: Homans sign is negative, no sign of DVT Skin: Skin color, texture, turgor normal. No rashes or lesions Neurologic: Grossly normal   Lab Results  Component Value Date   WBC 11.5 (H) 10/23/2019   HGB 12.0 10/23/2019   HCT 35.0 (L) 10/23/2019   MCV 92.1 10/23/2019   PLT 178 10/23/2019   Lab Results  Component Value Date    PREGTESTUR NEGATIVE 07/12/2018   HCG <5.0 05/09/2019   Urinalysis    Component Value Date/Time   COLORURINE YELLOW 10/23/2019 0712   APPEARANCEUR CLOUDY (A) 10/23/2019 0712   LABSPEC 1.020 10/23/2019 0712   PHURINE 6.0 10/23/2019 0712   GLUCOSEU NEGATIVE 10/23/2019 0712   HGBUR LARGE (A) 10/23/2019 0712   BILIRUBINUR NEGATIVE 10/23/2019 0712   KETONESUR NEGATIVE 10/23/2019 0712   PROTEINUR 30 (A) 10/23/2019 0712   UROBILINOGEN 1.0 05/03/2010 1423   NITRITE NEGATIVE 10/23/2019 0712   LEUKOCYTESUR SMALL (A) 10/23/2019 0712    US Renal  Result Date: 10/23/2019 CLINICAL DATA:  RIGHT flank pain. Five months pregnant. History of renal stones. EXAM: RENAL / URINARY TRACT ULTRASOUND COMPLETE COMPARISON:  CT abdomen dated 02/12/2017. FINDINGS: Right Kidney: Renal measurements: 12 x 4.3 x 5.2 cm = volume: 139 mL. Cortical echogenicity is within normal limits. Hydronephrosis, mild to moderate in degree. Left Kidney: Renal measurements: 11.2 x 4.9 x 4.5 cm = volume: 132 mL. Cortical echogenicity is within normal limits. No hydronephrosis. Bladder: Bladder appears normal. Both distal ureters are shown to be patent at the level of the bladder (bilateral ureteral jets are visualized). Other: None. IMPRESSION: 1. RIGHT hydronephrosis, mild to moderate in degree. This may be physiologic related to the gravid uterus. 2. Both distal ureters are shown to be patent at the level of the bladder (bilateral ureteral jets visualized). Electronically Signed   By: Franki Cabot M.D.   On: 10/23/2019 10:00   US Abdomen Limited RUQ  Result Date: 10/23/2019 CLINICAL DATA:  Right upper quadrant/right flank pain EXAM: ULTRASOUND ABDOMEN LIMITED RIGHT UPPER QUADRANT COMPARISON:  Renal ultrasound May 09, 2019 FINDINGS: Gallbladder: No gallstones or wall thickening visualized. There is no pericholecystic fluid. No sonographic Murphy sign noted by sonographer. Common bile duct: Diameter: 4 mm. No intrahepatic or extrahepatic  biliary duct dilatation. Liver: No focal lesion identified. Within normal limits in parenchymal echogenicity. Portal vein is patent on color Doppler imaging with normal direction of blood flow towards the liver. Other: There is fullness of the right renal collecting system. No appreciable ureterectasis. IMPRESSION: Fullness of the right renal collecting system without site of obstruction noted. Question fullness secondary to known intrauterine gestation. Study otherwise unremarkable. Electronically Signed   By: Lowella Grip III M.D.   On: 10/23/2019 09:43     Assessment/Plan Principal Problem:   Nephrolithiasis Active Problems:   Pregnancy  Admit IV hydration Pain control Strain urine  Donnamae Jude 10/23/2019, 6:28 PM

## 2019-10-24 LAB — URINE CULTURE: Culture: <10000 — AB

## 2019-10-24 MED ORDER — TAMSULOSIN HCL 0.4 MG PO CAPS
0.4000 mg | ORAL_CAPSULE | Freq: Every day | ORAL | Status: DC
  Administered 2019-10-24 – 2019-10-25 (×2): 0.4 mg via ORAL
  Filled 2019-10-24 (×2): qty 1

## 2019-10-24 MED ORDER — CYCLOBENZAPRINE HCL 10 MG PO TABS
10.0000 mg | ORAL_TABLET | Freq: Once | ORAL | Status: AC
  Administered 2019-10-24: 10 mg via ORAL
  Filled 2019-10-24: qty 1

## 2019-10-24 NOTE — Progress Notes (Signed)
Patient ID: Stephanie Frazier, female   DOB: 2000/09/06, 19 y.o.   MRN: [id number] [place]) NOTE  Stephanie Frazier is a 19 y.o. G1P0 at [redacted]w[redacted]d by best clinical estimate who is admitted for kidney stones.   Fetal presentation is unsure. Length of Stay:  0  Days  ASSESSMENT: Principal Problem:   Nephrolithiasis Active Problems:   Pregnancy   PLAN: IVF hydration Flomax Strain urine Pain control Appreciate Urology input--for imaging tomorrow, if persistent symptoms  Subjective: Feels better, pain continues, but has not seen a stone, nor needed IV pain meds for some time. Patient reports the fetal movement as active.  Vitals:  Blood pressure 118/60, pulse 85, temperature 98.1 F (36.7 C), temperature source Oral, resp. rate 18, height 5\' 7"  (1.702 m), weight 74.4 kg, last menstrual period 04/24/2019, SpO2 99 %. Physical Examination:  General appearance - alert, well appearing, and in no distress Chest - normal effort Abdomen - gravid, non-tender Fundal Height:  size equals dates Extremities: Homans sign is negative, no sign of DVT  Back: No significant CVA tenderness   Medications:  Scheduled . docusate sodium  100 mg Oral Daily  . prenatal multivitamin  1 tablet Oral Q1200  . sodium chloride flush  3 mL Intravenous Q12H  . tamsulosin  0.4 mg Oral Daily   I have reviewed the patient's current medications.   Donnamae Jude, MD 10/24/2019,1:58 PM

## 2019-10-24 NOTE — Progress Notes (Signed)
Pt reports having an episode of blurry vision which is a precurser for her migraines.  She takes Advil or Excedrin at home when she gets migraines.  She is not feeling a headache at this moment, but feels certain she will have one.  VSS.  Phoned Dr. Kennon Rounds with above information.  See order for Flexeril.

## 2019-10-24 NOTE — Consult Note (Signed)
Urology Consult   Physician requesting consult: Fransico Meadow, MD  Reason for consult: 21-week pregnancy, possible kidney stone  History of Present Illness: Stephanie Frazier is a 19 y.o. female with prior history of a calculus that passed about 4 years ago, presented to the emergency room at [facility] yesterday with 2 to 3-day history of flank pain on the right side, nausea and vomiting.  She cannot remember if this is reminiscent of her prior kidney stone.  She did have microscopic hematuria on urinalysis as well as pyuria although she had small leukocytes and negative nitrite on dipstick.  She has been on cephalexin.  She denies fever or chills.  She has had fairly steady increase in urinary frequency over the past few weeks indicative of her pregnancy.  She had renal ultrasound performed yesterday that revealed mild to moderate right-sided hydronephrosis without evident renal calculi seen.  There were ureteral jets bilaterally on bladder view.  She was transported to [facility] hospital and has been admitted for observation.  She has minimal pain now, her last injection for pain medicine was last night before bedtime.  She denies gross hematuria or dysuria.   Past Medical History:  Diagnosis Date  . Allergy   . Anxiety   . Depression   . Hypertension   . Hypertension   . Migraine   . Migraines   . Movement disorder   . Seizures ([place])    last seizure in 2009    Past Surgical History:  Procedure Laterality Date  . BUNIONECTOMY Right 07/23/2016  . BUNIONECTOMY Left 02/2016  . FOOT SURGERY       Current Hospital Medications: Scheduled Meds: . docusate sodium  100 mg Oral Daily  . prenatal multivitamin  1 tablet Oral Q1200  . sodium chloride flush  3 mL Intravenous Q12H   Continuous Infusions: . sodium chloride    . sodium chloride     PRN Meds:.sodium chloride, sodium chloride, acetaminophen, calcium carbonate, fentaNYL (SUBLIMAZE) injection, sodium chloride flush,  zolpidem  Allergies:  Allergies  Allergen Reactions  . Morphine And Related Shortness Of Breath and Other (See Comments)    Headache and "trouble breathing"  . Ondansetron Hcl Rash  . Zofran Rash  . Ketorolac Rash    Family History  Problem Relation Age of Onset  . Seizures Mother   . Depression Mother   . ADD / ADHD Brother   . Depression Brother   . Migraines Maternal Grandmother   . Depression Maternal Grandmother   . Migraines Maternal Aunt   . Depression Maternal Aunt   . Migraines Maternal Uncle   . ADD / ADHD Cousin        Many Maternal 1st Cousins have [facility]    Social History:  reports that she is a non-smoker but has been exposed to tobacco smoke. She has never used smokeless tobacco. She reports that she does not drink alcohol or use drugs.  ROS: A complete review of systems was performed.  All systems are negative except for pertinent findings as noted.  Physical Exam:  Vital signs in last 24 hours: Temp:  [98 F (36.7 C)-99.1 F (37.3 C)] 98.3 F (36.8 C) (04/04 0740) Pulse Rate:  [74-91] 74 (04/04 0740) Resp:  [16-18] 18 (04/04 0740) BP: (106-133)/(56-84) 106/56 (04/04 0740) SpO2:  [99 %-100 %] 99 % (04/04 0740) General:  Alert and oriented, No acute distress HEENT: Normocephalic, atraumatic Neck: No JVD or lymphadenopathy Cardiovascular: Regular rate  Lungs: Normal inspiratory/expiratory  excursion Abdomen: Gravid, soft, mild right lower quadrant tenderness.  No rebound or guarding. Back: Mild right-sided CVA tenderness Extremities: No edema Neurologic: Grossly intact  Laboratory Data:  Recent Labs    10/23/19 0720  WBC 11.5*  HGB 12.0  HCT 35.0*  PLT 178    Recent Labs    10/23/19 0720  NA 136  K 3.6  CL 102  GLUCOSE 88  BUN 9  CALCIUM 8.7*  CREATININE 0.56     No results found for this or any previous visit (from the past 24 hour(s)). Recent Results (from the past 240 hour(s))  Urine culture     Status: Abnormal   Collection  Time: 10/23/19  7:12 AM   Specimen: Urine, Random  Result Value Ref Range Status   Specimen Description   Final    URINE, RANDOM Performed at [facility] [address] [place], [place] [address]    Special Requests   Final    NONE Performed at [facility], [address] [place], [place] [address]    Culture (A)  Final    <10,000 COLONIES/mL INSIGNIFICANT GROWTH Performed at [facility], [place] [address], [place], [place] [address]    Report Status 10/24/2019 FINAL  Final  SARS CORONAVIRUS 2 (TAT 6-24 HRS) Nasopharyngeal Nasopharyngeal Swab     Status: None   Collection Time: 10/23/19 11:10 AM   Specimen: Nasopharyngeal Swab  Result Value Ref Range Status   SARS Coronavirus 2 NEGATIVE NEGATIVE Final    Comment: (NOTE) SARS-CoV-2 target nucleic acids are NOT DETECTED. The SARS-CoV-2 RNA is generally detectable in upper and lower respiratory specimens during the acute phase of infection. Negative results do not preclude SARS-CoV-2 infection, do not rule out co-infections with other pathogens, and should not be used as the sole basis for treatment or other patient management decisions. Negative results must be combined with clinical observations, patient history, and epidemiological information. The expected result is Negative. Fact Sheet for Patients: SugarRoll.be Fact Sheet for Healthcare Providers: https://www.woods-mathews.com/ This test is not yet approved or cleared by the [place] FDA and  has been authorized for detection and/or diagnosis of SARS-CoV-2 by FDA under an Emergency Use Authorization (EUA). This EUA will remain  in effect (meaning this test can be used) for the duration of the COVID-19 declaration under Section 56 4(b)(1) of the Act, 21 U.S.C. section 360bbb-3(b)(1), unless the authorization is terminated or revoked sooner. Performed at [facility], [place] [address],  [place],  [address]     Renal Function: Recent Labs    10/23/19 0720  CREATININE 0.56   Estimated Creatinine Clearance: 119.1 mL/min (by C-G formula based on SCr of 0.56 mg/dL).  Radiologic Imaging: US Renal  Result Date: 10/23/2019 CLINICAL DATA:  RIGHT flank pain. Five months pregnant. History of renal stones. EXAM: RENAL / URINARY TRACT ULTRASOUND COMPLETE COMPARISON:  CT abdomen dated 02/12/2017. FINDINGS: Right Kidney: Renal measurements: 12 x 4.3 x 5.2 cm = volume: 139 mL. Cortical echogenicity is within normal limits. Hydronephrosis, mild to moderate in degree. Left Kidney: Renal measurements: 11.2 x 4.9 x 4.5 cm = volume: 132 mL. Cortical echogenicity is within normal limits. No hydronephrosis. Bladder: Bladder appears normal. Both distal ureters are shown to be patent at the level of the bladder (bilateral ureteral jets are visualized). Other: None. IMPRESSION: 1. RIGHT hydronephrosis, mild to moderate in degree. This may be physiologic related to the gravid uterus. 2. Both distal  ureters are shown to be patent at the level of the bladder (bilateral ureteral jets visualized). Electronically Signed   By: Franki Cabot M.D.   On: 10/23/2019 10:00   US Abdomen Limited RUQ  Result Date: 10/23/2019 CLINICAL DATA:  Right upper quadrant/right flank pain EXAM: ULTRASOUND ABDOMEN LIMITED RIGHT UPPER QUADRANT COMPARISON:  Renal ultrasound May 09, 2019 FINDINGS: Gallbladder: No gallstones or wall thickening visualized. There is no pericholecystic fluid. No sonographic Murphy sign noted by sonographer. Common bile duct: Diameter: 4 mm. No intrahepatic or extrahepatic biliary duct dilatation. Liver: No focal lesion identified. Within normal limits in parenchymal echogenicity. Portal vein is patent on color Doppler imaging with normal direction of blood flow towards the liver. Other: There is fullness of the right renal collecting system. No appreciable ureterectasis. IMPRESSION: Fullness of the  right renal collecting system without site of obstruction noted. Question fullness secondary to known intrauterine gestation. Study otherwise unremarkable. Electronically Signed   By: Lowella Grip III M.D.   On: 10/23/2019 09:43    I independently reviewed the above imaging/lab studies.  Impression/Assessment:  21-week pregnant female, prima gravida, with possible right-sided ureteral stone.  Not visible on renal ultrasound that revealed right hydronephrosis which could be consistent with her gravid state.  Ureteral jet was seen on the right side.  I would think her symptoms are more reminiscent of a ureteral calculus than an infection.  Plan:  I discussed further management with the patient and her partner, who is in attendance today.  Certainly, she is fairly comfortable at the present time.  I think a bit of observation/pain management is warranted in this acute phase.  If her pain continues, consider MRI of the abdomen and pelvis with contrast, which at her 21-week pregnant state is much safer and even a limited radiation dose CT scan.  Consideration should be given this MRI if she is still symptomatic by tomorrow.  I have taken the liberty of starting her on tamsulosin which, if the stone is present, would decrease the time to passage.  I agree with straining her urine.  I will continue to follow her.

## 2019-10-25 DIAGNOSIS — O99891 Other specified diseases and conditions complicating pregnancy: Secondary | ICD-10-CM

## 2019-10-25 DIAGNOSIS — N200 Calculus of kidney: Secondary | ICD-10-CM

## 2019-10-25 DIAGNOSIS — Z3A21 21 weeks gestation of pregnancy: Secondary | ICD-10-CM

## 2019-10-25 DIAGNOSIS — Z87442 Personal history of urinary calculi: Secondary | ICD-10-CM

## 2019-10-25 MED ORDER — TAMSULOSIN HCL 0.4 MG PO CAPS: 0.4000 mg | ORAL_CAPSULE | Freq: Every day | ORAL | 3 refills | Status: DC

## 2019-10-25 MED ORDER — HYDROCODONE-ACETAMINOPHEN 5-325 MG PO TABS: 1.0000 | ORAL_TABLET | Freq: Four times a day (QID) | ORAL | 0 refills | Status: DC | PRN

## 2019-10-25 MED ORDER — PROMETHAZINE HCL 25 MG PO TABS: 25.0000 mg | ORAL_TABLET | Freq: Four times a day (QID) | ORAL | 1 refills | Status: DC | PRN

## 2019-10-25 NOTE — Discharge Instructions (Signed)
Renal Colic  Renal colic is pain that is caused by a kidney stone. The pain can be sharp and very bad. It may be felt in the back, belly, side (flank), or groin. It can cause nausea. Renal colic can come and go. Follow these instructions at home: Medicines  Take over-the-counter and prescription medicines only as told by your doctor.  Do not drive or use heavy machinery while taking prescription pain medicine. Eating and drinking   Drink enough fluid to keep your pee (urine) pale yellow. You may be told to drink at least 8-10 glasses of water each day. Follow instructions from your doctor.  If told, change your diet. This may include eating: ? Less salt (sodium). Eat less than 2 grams (2,000 mg) of salt per day. ? Less meat, poultry, fish, and eggs. ? More fruits and vegetables. ? Try not to eat spinach, rhubarb, sweet potatoes, or nuts.  Follow instructions from your doctor about what foods and drinks to avoid. General instructions  Keep all follow-up visits as told by your doctor. This is important.  Collect pee samples as told by your doctor.  Strain your pee every time you pee, as told by your doctor. Use the strainer that your doctor recommends.  Do not throw out the kidney stone after passing it. Keep the stone so it can be tested by your doctor. Contact a doctor if:  You have a fever or chills.  Your pee smells bad or looks cloudy.  You have pain or burning when you pee. Get help right away if:  The pain in your side (flank) or your groin suddenly gets worse.  You get confused.  You pass out. Summary  Renal colic is pain that is caused by a kidney stone.  Take over-the-counter and prescription medicines only as told by your doctor.  Drink enough fluid to keep your pee pale yellow. You may be told to drink at least 8-10 glasses of water each day. Follow instructions from your doctor.  Strain your pee every time you pee, as told by your doctor. Use the  strainer that your doctor recommends.  Do not throw out the kidney stone after passing it. Keep the stone so it can be tested by your doctor. This information is not intended to replace advice given to you by your health care provider. Make sure you discuss any questions you have with your health care provider. Document Revised: 08/05/2017 Document Reviewed: 08/05/2017 Elsevier Patient Education  2020 [employer]vier Inc.  

## 2019-10-25 NOTE — Discharge Summary (Signed)
Physician Discharge Summary  Patient ID: Stephanie Frazier. Crosby MRN: [id number] DOB/AGE: 02/03/2001 19 y.o.  Admit date: 10/23/2019 Discharge date: 10/25/2019  Admission Diagnoses: [redacted]w[redacted]d  Renal colic with history of calculus in past   Discharge Diagnoses:  Principal Problem:   Nephrolithiasis Active Problems:   Pregnancy   Renal calculus   Discharged Condition: good  Hospital Course: negative urine culutre Right renal colic improved significantly over the last 24 hours of her admission Urology consult rec flomax and MRI if worsens  Consults: urology  Significant Diagnostic Studies: labs: urine culture  Treatments: IV hydration and pain management  Discharge Exam: Blood pressure (!) 109/54, pulse 78, temperature 97.9 F (36.6 C), temperature source Oral, resp. rate 18, height 5\' 7"  (1.702 m), weight 74.4 kg, last menstrual period 04/24/2019, SpO2 100 %. General appearance: alert, cooperative and no distress Back exam is normal no CVAT  Disposition: Discharge disposition: 01-Home or Self Care       Discharge Instructions    Call MD for:  persistant nausea and vomiting   Complete by: As directed    Call MD for:  severe uncontrolled pain   Complete by: As directed    Call MD for:  temperature >100.4   Complete by: As directed    Diet - low sodium heart healthy   Complete by: As directed    Increase activity slowly   Complete by: As directed      Allergies as of 10/25/2019      Reactions   Morphine And Related Shortness Of Breath, Other (See Comments)   Headache and "trouble breathing"   Ondansetron Hcl Rash   Zofran Rash   Ketorolac Rash      Medication List    TAKE these medications   bacitracin ointment Apply 1 application topically 2 (two) times daily. Apply to burns as prescribed.   FLUoxetine 10 MG capsule Commonly known as: PROZAC Take 3 capsules (30 mg total) by mouth at bedtime.   fluticasone 50 MCG/ACT nasal spray Commonly known as:  FLONASE Place 1 spray into both nostrils daily.   Garlic 123XX123 MG Caps Take 1,000 mg by mouth daily.   HYDROcodone-acetaminophen 5-325 MG tablet Commonly known as: NORCO/VICODIN Take 1 tablet by mouth every 6 (six) hours as needed.   loratadine 10 MG tablet Commonly known as: CLARITIN TK 1 T PO QD   montelukast 10 MG tablet Commonly known as: SINGULAIR TK 1 T PO QD IN THE EVE   ONE-A-DAY WOMENS PO Take 1 tablet by mouth daily.   oxymetazoline 0.05 % nasal spray Commonly known as: AFRIN Place 1 spray into both nostrils 2 (two) times daily.   Pataday 0.2 % Soln Generic drug: Olopatadine HCl INT 1 GTT IN EACH EYE ONCE D FOR 30 DAYS   promethazine 12.5 MG tablet Commonly known as: PHENERGAN Take 1 tablet at onset of nausea. May repeat x1 in 8 hours if nausea persists What changed: Another medication with the same name was added. Make sure you understand how and when to take each.   promethazine 25 MG tablet Commonly known as: PHENERGAN Take 1 tablet (25 mg total) by mouth every 6 (six) hours as needed for nausea. What changed: You were already taking a medication with the same name, and this prescription was added. Make sure you understand how and when to take each.   tamsulosin 0.4 MG Caps capsule Commonly known as: FLOMAX Take 1 capsule (0.4 mg total) by mouth daily.   traZODone 100 MG tablet  Commonly known as: DESYREL Take 1 tablet (100 mg total) by mouth at bedtime.   VITAMIN B 12 PO Take 500 mg by mouth daily.      Follow-up Information    [facility] Follow up in 1 week(s).   Specialty: Obstetrics and Gynecology Why: initiate prenatal care Contact information: [address], [place] [phone]          Signed: Florian Buff 10/25/2019, 7:46 AM

## 2019-12-17 ENCOUNTER — Ambulatory Visit (INDEPENDENT_AMBULATORY_CARE_PROVIDER_SITE_OTHER): Payer: Medicaid Other | Admitting: Obstetrics & Gynecology

## 2019-12-17 ENCOUNTER — Other Ambulatory Visit (HOSPITAL_COMMUNITY)
Admission: RE | Admit: 2019-12-17 | Discharge: 2019-12-17 | Disposition: A | Discharge status: Home / Self Care | Payer: Medicaid Other | Source: Ambulatory Visit | Attending: Obstetrics & Gynecology | Admitting: Obstetrics & Gynecology

## 2019-12-17 ENCOUNTER — Other Ambulatory Visit: Payer: Self-pay

## 2019-12-17 ENCOUNTER — Encounter: Payer: Self-pay | Admitting: Obstetrics & Gynecology

## 2019-12-17 VITALS — BP 131/75 | HR 102 | Wt 174.1 lb

## 2019-12-17 DIAGNOSIS — O0933 Supervision of pregnancy with insufficient antenatal care, third trimester: Secondary | ICD-10-CM

## 2019-12-17 DIAGNOSIS — O26843 Uterine size-date discrepancy, third trimester: Secondary | ICD-10-CM

## 2019-12-17 DIAGNOSIS — Z3400 Encounter for supervision of normal first pregnancy, unspecified trimester: Secondary | ICD-10-CM | POA: Insufficient documentation

## 2019-12-17 DIAGNOSIS — Z3A28 28 weeks gestation of pregnancy: Secondary | ICD-10-CM

## 2019-12-17 NOTE — Patient Instructions (Signed)
Return to office for any scheduled appointments. Call the office or go to the MAU at [facility] [place] if:  You begin to have strong, frequent contractions  Your water breaks.  Sometimes it is a big gush of fluid, sometimes it is just a trickle that keeps getting your panties wet or running down your legs  You have vaginal bleeding.  It is normal to have a small amount of spotting if your cervix was checked.   You do not feel your baby moving like normal.  If you do not, get something to eat and drink and lay down and focus on feeling your baby move.   If your baby is still not moving like normal, you should call the office or go to MAU.  Any other obstetric concerns.   Third Trimester of Pregnancy The third trimester is from week 28 through week 40 (months 7 through 9). The third trimester is a time when the unborn baby (fetus) is growing rapidly. At the end of the ninth month, the fetus is about 20 inches in length and weighs 6-10 pounds. Body changes during your third trimester Your body will continue to go through many changes during pregnancy. The changes vary from woman to woman. During the third trimester:  Your weight will continue to increase. You can expect to gain 25-35 pounds (11-16 kg) by the end of the pregnancy.  You may begin to get stretch marks on your hips, abdomen, and breasts.  You may urinate more often because the fetus is moving lower into your pelvis and pressing on your bladder.  You may develop or continue to have heartburn. This is caused by increased hormones that slow down muscles in the digestive tract.  You may develop or continue to have constipation because increased hormones slow digestion and cause the muscles that push waste through your intestines to relax.  You may develop hemorrhoids. These are swollen veins (varicose veins) in the rectum that can itch or be painful.  You may develop swollen, bulging veins (varicose veins)  in your legs.  You may have increased body aches in the pelvis, back, or thighs. This is due to weight gain and increased hormones that are relaxing your joints.  You may have changes in your hair. These can include thickening of your hair, rapid growth, and changes in texture. Some women also have hair loss during or after pregnancy, or hair that feels dry or thin. Your hair will most likely return to normal after your baby is born.  Your breasts will continue to grow and they will continue to become tender. A yellow fluid (colostrum) may leak from your breasts. This is the first milk you are producing for your baby.  Your belly button may stick out.  You may notice more swelling in your hands, face, or ankles.  You may have increased tingling or numbness in your hands, arms, and legs. The skin on your belly may also feel numb.  You may feel short of breath because of your expanding uterus.  You may have more problems sleeping. This can be caused by the size of your belly, increased need to urinate, and an increase in your body's metabolism.  You may notice the fetus "dropping," or moving lower in your abdomen (lightening).  You may have increased vaginal discharge.  You may notice your joints feel loose and you may have pain around your pelvic bone. What to expect at prenatal visits You will have   prenatal exams every 2 weeks until week 36. Then you will have weekly prenatal exams. During a routine prenatal visit:  You will be weighed to make sure you and the baby are growing normally.  Your blood pressure will be taken.  Your abdomen will be measured to track your baby's growth.  The fetal heartbeat will be listened to.  Any test results from the previous visit will be discussed.  You may have a cervical check near your due date to see if your cervix has softened or thinned (effaced).  You will be tested for Group B streptococcus. This happens between 35 and 37 weeks. Your  health care provider may ask you:  What your birth plan is.  How you are feeling.  If you are feeling the baby move.  If you have had any abnormal symptoms, such as leaking fluid, bleeding, severe headaches, or abdominal cramping.  If you are using any tobacco products, including cigarettes, chewing tobacco, and electronic cigarettes.  If you have any questions. Other tests or screenings that may be performed during your third trimester include:  Blood tests that check for low iron levels (anemia).  Fetal testing to check the health, activity level, and growth of the fetus. Testing is done if you have certain medical conditions or if there are problems during the pregnancy.  Nonstress test (NST). This test checks the health of your baby to make sure there are no signs of problems, such as the baby not getting enough oxygen. During this test, a belt is placed around your belly. The baby is made to move, and its heart rate is monitored during movement. What is false labor? False labor is a condition in which you feel small, irregular tightenings of the muscles in the womb (contractions) that usually go away with rest, changing position, or drinking water. These are called Braxton Hicks contractions. Contractions may last for hours, days, or even weeks before true labor sets in. If contractions come at regular intervals, become more frequent, increase in intensity, or become painful, you should see your health care provider. What are the signs of labor?  Abdominal cramps.  Regular contractions that start at 10 minutes apart and become stronger and more frequent with time.  Contractions that start on the top of the uterus and spread down to the lower abdomen and back.  Increased pelvic pressure and dull back pain.  A watery or bloody mucus discharge that comes from the vagina.  Leaking of amniotic fluid. This is also known as your "water breaking." It could be a slow trickle or a gush.  Let your health care provider know if it has a color or strange odor. If you have any of these signs, call your health care provider right away, even if it is before your due date. Follow these instructions at home: Medicines  Follow your health care provider's instructions regarding medicine use. Specific medicines may be either safe or unsafe to take during pregnancy.  Take a prenatal vitamin that contains at least 600 micrograms (mcg) of folic acid.  If you develop constipation, try taking a stool softener if your health care provider approves. Eating and drinking   Eat a balanced diet that includes fresh fruits and vegetables, whole grains, good sources of protein such as meat, eggs, or tofu, and low-fat dairy. Your health care provider will help you determine the amount of weight gain that is right for you.  Avoid raw meat and uncooked cheese. These carry germs that   can cause birth defects in the baby.  If you have low calcium intake from food, talk to your health care provider about whether you should take a daily calcium supplement.  Eat four or five small meals rather than three large meals a day.  Limit foods that are high in fat and processed sugars, such as fried and sweet foods.  To prevent constipation: ? Drink enough fluid to keep your urine clear or pale yellow. ? Eat foods that are high in fiber, such as fresh fruits and vegetables, whole grains, and beans. Activity  Exercise only as directed by your health care provider. Most women can continue their usual exercise routine during pregnancy. Try to exercise for 30 minutes at least 5 days a week. Stop exercising if you experience uterine contractions.  Avoid heavy lifting.  Do not exercise in extreme heat or humidity, or at high altitudes.  Wear low-heel, comfortable shoes.  Practice good posture.  You may continue to have sex unless your health care provider tells you otherwise. Relieving pain and  discomfort  Take frequent breaks and rest with your legs elevated if you have leg cramps or low back pain.  Take warm sitz baths to soothe any pain or discomfort caused by hemorrhoids. Use hemorrhoid cream if your health care provider approves.  Wear a good support bra to prevent discomfort from breast tenderness.  If you develop varicose veins: ? Wear support pantyhose or compression stockings as told by your healthcare provider. ? Elevate your feet for 15 minutes, 3-4 times a day. Prenatal care  Write down your questions. Take them to your prenatal visits.  Keep all your prenatal visits as told by your health care provider. This is important. Safety  Wear your seat belt at all times when driving.  Make a list of emergency phone numbers, including numbers for family, friends, the hospital, and police and fire departments. General instructions  Avoid cat litter boxes and soil used by cats. These carry germs that can cause birth defects in the baby. If you have a cat, ask someone to clean the litter box for you.  Do not travel far distances unless it is absolutely necessary and only with the approval of your health care provider.  Do not use hot tubs, steam rooms, or saunas.  Do not drink alcohol.  Do not use any products that contain nicotine or tobacco, such as cigarettes and e-cigarettes. If you need help quitting, ask your health care provider.  Do not use any medicinal herbs or unprescribed drugs. These chemicals affect the formation and growth of the baby.  Do not douche or use tampons or scented sanitary pads.  Do not cross your legs for long periods of time.  To prepare for the arrival of your baby: ? Take prenatal classes to understand, practice, and ask questions about labor and delivery. ? Make a trial run to the hospital. ? Visit the hospital and tour the maternity area. ? Arrange for maternity or paternity leave through employers. ? Arrange for family and  friends to take care of pets while you are in the hospital. ? Purchase a rear-facing car seat and make sure you know how to install it in your car. ? Pack your hospital bag. ? Prepare the baby's nursery. Make sure to remove all pillows and stuffed animals from the baby's crib to prevent suffocation.  Visit your dentist if you have not gone during your pregnancy. Use a soft toothbrush to brush your teeth and be   gentle when you floss. Contact a health care provider if:  You are unsure if you are in labor or if your water has broken.  You become dizzy.  You have mild pelvic cramps, pelvic pressure, or nagging pain in your abdominal area.  You have lower back pain.  You have persistent nausea, vomiting, or diarrhea.  You have an unusual or bad smelling vaginal discharge.  You have pain when you urinate. Get help right away if:  Your water breaks before 37 weeks.  You have regular contractions less than 5 minutes apart before 37 weeks.  You have a fever.  You are leaking fluid from your vagina.  You have spotting or bleeding from your vagina.  You have severe abdominal pain or cramping.  You have rapid weight loss or weight gain.  You have shortness of breath with chest pain.  You notice sudden or extreme swelling of your face, hands, ankles, feet, or legs.  Your baby makes fewer than 10 movements in 2 hours.  You have severe headaches that do not go away when you take medicine.  You have vision changes. Summary  The third trimester is from week 28 through week 40, months 7 through 9. The third trimester is a time when the unborn baby (fetus) is growing rapidly.  During the third trimester, your discomfort may increase as you and your baby continue to gain weight. You may have abdominal, leg, and back pain, sleeping problems, and an increased need to urinate.  During the third trimester your breasts will keep growing and they will continue to become tender. A yellow  fluid (colostrum) may leak from your breasts. This is the first milk you are producing for your baby.  False labor is a condition in which you feel small, irregular tightenings of the muscles in the womb (contractions) that eventually go away. These are called Braxton Hicks contractions. Contractions may last for hours, days, or even weeks before true labor sets in.  Signs of labor can include: abdominal cramps; regular contractions that start at 10 minutes apart and become stronger and more frequent with time; watery or bloody mucus discharge that comes from the vagina; increased pelvic pressure and dull back pain; and leaking of amniotic fluid. This information is not intended to replace advice given to you by your health care provider. Make sure you discuss any questions you have with your health care provider. Document Revised: 10/29/2018 Document Reviewed: 08/13/2016 Elsevier Patient Education  2020 Elsevier Inc.  

## 2019-12-17 NOTE — Progress Notes (Signed)
History:   Stephanie Frazier is a 19 y.o. G1P0 at [redacted]w[redacted]d by LMP being seen today for her first obstetrical visit.  Her obstetrical history is significant for recent admission for nephrolithiasis. Doing well now. Reports she had an ultrasound at [facility] and her dating was 2-3 different from LMP; does not have report. . Pregnancy history fully reviewed.  Patient reports no complaints.      HISTORY: OB History  Gravida Para Term Preterm AB Living  1 0 0 0 0 0  SAB TAB Ectopic Multiple Live Births  0 0 0 0 0    # Outcome Date GA Lbr Len/2nd Weight Sex Delivery Anes PTL Lv  1 Current             Past Medical History:  Diagnosis Date  . Allergy   . Anxiety   . Depression   . Hypertension   . Hypertension   . Migraine   . Migraines   . Movement disorder   . Seizures ([place])    last seizure in 2009   Past Surgical History:  Procedure Laterality Date  . BUNIONECTOMY Right 07/23/2016  . BUNIONECTOMY Left 02/2016  . FOOT SURGERY     Family History  Problem Relation Age of Onset  . Seizures Mother   . Depression Mother   . ADD / ADHD Brother   . Depression Brother   . Migraines Maternal Grandmother   . Depression Maternal Grandmother   . Migraines Maternal Aunt   . Depression Maternal Aunt   . Migraines Maternal Uncle   . ADD / ADHD Cousin        Many Maternal 1st Cousins have [facility]   Social History   Tobacco Use  . Smoking status: Passive Smoke Exposure - Never Smoker  . Smokeless tobacco: Never Used  . Tobacco comment: Parents smoke outside  Substance Use Topics  . Alcohol use: No    Alcohol/week: 0.0 standard drinks  . Drug use: No   Allergies  Allergen Reactions  . Morphine And Related Shortness Of Breath and Other (See Comments)    Headache and "trouble breathing"  . Ondansetron Hcl Rash  . Zofran Rash  . Ketorolac Rash   Current Outpatient Medications on File Prior to Visit  Medication Sig Dispense Refill  . Prenatal Vit-Fe Fumarate-FA  (PRENATAL VITAMINS PO) Take by mouth.     No current facility-administered medications on file prior to visit.    Review of Systems Pertinent items noted in HPI and remainder of comprehensive ROS otherwise negative. Physical Exam:   Vitals:   12/17/19 0830  BP: 131/75  Pulse: (!) 102  Weight: 174 lb 1.9 oz (79 kg)   Fetal Heart Rate (bpm): 153  Uterus:  Fundal Height: 25 cm  System: General: well-developed, well-nourished female in no acute distress   Skin: normal coloration and turgor, no rashes   Neurologic: oriented, normal, negative, normal mood   Extremities: normal strength, tone, and muscle mass, ROM of all joints is normal   HEENT PERRLA, extraocular movement intact and sclera clear, anicteric   Mouth/Teeth mucous membranes moist, pharynx normal without lesions and dental hygiene good   Neck supple and no masses   Cardiovascular: regular rate and rhythm   Respiratory:  no respiratory distress, normal breath sounds   Abdomen: soft, non-tender; bowel sounds normal; no masses,  no organomegaly   Pelvic: deferred    Assessment:    Pregnancy: G1P0 Patient Active Problem List   Diagnosis  Date Noted  . Supervision of normal first pregnancy, antepartum 12/17/2019  . Late prenatal care affecting pregnancy in third trimester 12/17/2019  . Severe recurrent major depression with psychotic features ([place]) 08/02/2017  . Nephrolithiasis   . Migraine without aura and with status migrainosus, not intractable 07/20/2015  . Episodic tension-type headache, not intractable 07/20/2015  . Mood disorder ([place]) 07/20/2015  . Insomnia 07/20/2015  . ADHD (attention deficit hyperactivity disorder) 04/09/2013     Plan:    1. Late prenatal care affecting pregnancy in third trimester 2. [redacted] weeks gestation of pregnancy 3. Fundal height low for dates in third trimester She will attempt to get outside ultrasound report. Will follow up MFM anatomy scan. Patient told her EDD may change. - [place]  MFM OB DETAIL +14 WK; Future  4. Supervision of normal first pregnancy, antepartum - Culture, OB Urine - GC/Chlamydia probe amp ([place])not at [facility] - Genetic Screening - Obstetric Panel, Including HIV - Hepatitis C antibody - Hemoglobin A1c - Comprehensive metabolic panel Initial labs drawn. Continue prenatal vitamins. Problem list reviewed and updated. Genetic Screening discussed, NIPS: ordered. Ultrasound discussed; fetal anatomic survey: ordered. Anticipatory guidance for prenatal visits including labs, ultrasounds, and testing Encouraged to complete MyChart Registration for her ability to review results, send requests, and have questions addressed.  The nature of [place] for [facility]ulty Practice with multiple MDs and Advanced Practice Providers was explained to patient; also emphasized that residents, students are part of our team. Routine obstetric precautions reviewed. Encouraged to seek out care at office or emergency room [facility] MAU preferred) for urgent and/or emergent concerns. Return in about 2 weeks (around 12/31/2019) for 2 hr GTT, 3rd trimester labs, TDap, OFFICE OB Visit.     Verita Schneiders, MD, [place] for [employer], [place]

## 2019-12-18 LAB — OBSTETRIC PANEL, INCLUDING HIV
Antibody Screen: NEGATIVE
Basophils Absolute: 0 10*3/uL (ref 0.0–0.2)
Basos: 0 %
EOS (ABSOLUTE): 0.1 10*3/uL (ref 0.0–0.4)
Eos: 1 %
HIV Screen 4th Generation wRfx: NONREACTIVE
Hematocrit: 35 % (ref 34.0–46.6)
Hemoglobin: 11.7 g/dL (ref 11.1–15.9)
Hepatitis B Surface Ag: NEGATIVE
Immature Grans (Abs): 0.1 10*3/uL (ref 0.0–0.1)
Immature Granulocytes: 1 %
Lymphocytes Absolute: 2.3 10*3/uL (ref 0.7–3.1)
Lymphs: 22 %
MCH: 31.5 pg (ref 26.6–33.0)
MCHC: 33.4 g/dL (ref 31.5–35.7)
MCV: 94 fL (ref 79–97)
Monocytes Absolute: 0.5 10*3/uL (ref 0.1–0.9)
Monocytes: 5 %
Neutrophils Absolute: 7.2 10*3/uL — ABNORMAL HIGH (ref 1.4–7.0)
Neutrophils: 71 %
Platelets: 241 10*3/uL (ref 150–450)
RBC: 3.71 x10E6/uL — ABNORMAL LOW (ref 3.77–5.28)
RDW: 12.6 % (ref 11.7–15.4)
RPR Ser Ql: NONREACTIVE
Rh Factor: POSITIVE
Rubella Antibodies, IGG: 1.85 index (ref >0.99)
WBC: 10.2 10*3/uL (ref 3.4–10.8)

## 2019-12-18 LAB — COMPREHENSIVE METABOLIC PANEL
ALT: 12 IU/L (ref 0–32)
AST: 14 IU/L (ref 0–40)
Albumin/Globulin Ratio: 1.4 (ref 1.2–2.2)
Albumin: 3.7 g/dL — ABNORMAL LOW (ref 3.9–5.0)
Alkaline Phosphatase: 85 IU/L (ref 45–106)
BUN/Creatinine Ratio: 19 (ref 9–23)
BUN: 11 mg/dL (ref 6–20)
Bilirubin Total: 0.2 mg/dL (ref 0.0–1.2)
CO2: 19 mmol/L — ABNORMAL LOW (ref 20–29)
Calcium: 9.1 mg/dL (ref 8.7–10.2)
Chloride: 103 mmol/L (ref 96–106)
Creatinine, Ser: 0.58 mg/dL (ref 0.57–1.00)
GFR calc Af Amer: 155 mL/min/{1.73_m2} (ref >59)
GFR calc non Af Amer: 134 mL/min/{1.73_m2} (ref >59)
Globulin, Total: 2.7 g/dL (ref 1.5–4.5)
Glucose: 100 mg/dL — ABNORMAL HIGH (ref 65–99)
Potassium: 4 mmol/L (ref 3.5–5.2)
Sodium: 139 mmol/L (ref 134–144)
Total Protein: 6.4 g/dL (ref 6.0–8.5)

## 2019-12-18 LAB — HEPATITIS C ANTIBODY: Hep C Virus Ab: <0.1 s/co ratio (ref 0.0–0.9)

## 2019-12-18 LAB — HEMOGLOBIN A1C
Est. average glucose Bld gHb Est-mCnc: 100 mg/dL
Hgb A1c MFr Bld: 5.1 % (ref 4.8–5.6)

## 2019-12-19 LAB — URINE CULTURE, OB REFLEX

## 2019-12-19 LAB — CULTURE, OB URINE

## 2019-12-21 LAB — GC/CHLAMYDIA PROBE AMP (~~LOC~~) NOT AT ARMC
Chlamydia: NEGATIVE
Comment: NEGATIVE
Comment: NORMAL
Neisseria Gonorrhea: NEGATIVE

## 2019-12-31 ENCOUNTER — Ambulatory Visit (INDEPENDENT_AMBULATORY_CARE_PROVIDER_SITE_OTHER): Payer: Medicaid Other | Admitting: Family Medicine

## 2019-12-31 ENCOUNTER — Other Ambulatory Visit: Payer: Self-pay

## 2019-12-31 VITALS — BP 129/73 | HR 87 | Wt 176.0 lb

## 2019-12-31 DIAGNOSIS — O0933 Supervision of pregnancy with insufficient antenatal care, third trimester: Secondary | ICD-10-CM

## 2019-12-31 DIAGNOSIS — Z23 Encounter for immunization: Secondary | ICD-10-CM | POA: Diagnosis not present

## 2019-12-31 DIAGNOSIS — Z3400 Encounter for supervision of normal first pregnancy, unspecified trimester: Secondary | ICD-10-CM

## 2019-12-31 DIAGNOSIS — Z3A30 30 weeks gestation of pregnancy: Secondary | ICD-10-CM | POA: Diagnosis not present

## 2019-12-31 NOTE — Progress Notes (Signed)
   PRENATAL VISIT NOTE  Subjective:  Stephanie Frazier is a 19 y.o. G1P0 at [redacted]w[redacted]d being seen today for ongoing prenatal care.  She is currently monitored for the following issues for this low-risk pregnancy and has ADHD (attention deficit hyperactivity disorder); Migraine without aura and with status migrainosus, not intractable; Episodic tension-type headache, not intractable; Mood disorder ([place]); Insomnia; Nephrolithiasis; Severe recurrent major depression with psychotic features ([place]); Supervision of normal first pregnancy, antepartum; and Late prenatal care affecting pregnancy in third trimester on their problem list.  Patient reports no complaints.  Contractions: Not present. Vag. Bleeding: None.  Movement: Present. Denies leaking of fluid.   The following portions of the patient's history were reviewed and updated as appropriate: allergies, current medications, past family history, past medical history, past social history, past surgical history and problem list.   Objective:   Vitals:   12/31/19 0857  BP: 129/73  Pulse: 87  Weight: 176 lb (79.8 kg)    Fetal Status: Fetal Heart Rate (bpm): 144 Fundal Height: 28 cm Movement: Present     General:  Alert, oriented and cooperative. Patient is in no acute distress.  Skin: Skin is warm and dry. No rash noted.   Cardiovascular: Normal heart rate noted  Respiratory: Normal respiratory effort, no problems with respiration noted  Abdomen: Soft, gravid, appropriate for gestational age.  Pain/Pressure: Present     Pelvic: Cervical exam deferred        Extremities: Normal range of motion.  Edema: None  Mental Status: Normal mood and affect. Normal behavior. Normal judgment and thought content.   Assessment and Plan:  Pregnancy: G1P0 at [redacted]w[redacted]d 1. Late prenatal care affecting pregnancy in third trimester FHT and FH normal - Glucose Tolerance, 2 Hours w/1 Hour  2. Supervision of normal first pregnancy, antepartum S<D. Has f/u US next  week. - Glucose Tolerance, 2 Hours w/1 Hour  Preterm labor symptoms and general obstetric precautions including but not limited to vaginal bleeding, contractions, leaking of fluid and fetal movement were reviewed in detail with the patient. Please refer to After Visit Summary for other counseling recommendations.   Return in about 2 weeks (around 01/14/2020) for OB f/u.  Future Appointments  Date Time Provider [place]  01/05/2020 12:45 PM [facility] NURSE [facility]  01/05/2020 12:45 PM [facility] US4 [facility]  01/14/2020 11:00 AM Truett Mainland, DO [facility] None    Truett Mainland, DO

## 2020-01-01 LAB — GLUCOSE TOLERANCE, 2 HOURS W/ 1HR
Glucose, 1 hour: 112 mg/dL (ref 65–179)
Glucose, 2 hour: 125 mg/dL (ref 65–152)
Glucose, Fasting: 75 mg/dL (ref 65–91)

## 2020-01-05 ENCOUNTER — Other Ambulatory Visit: Payer: Self-pay

## 2020-01-05 ENCOUNTER — Ambulatory Visit: Payer: Medicaid Other | Admitting: *Deleted

## 2020-01-05 ENCOUNTER — Ambulatory Visit: Payer: Medicaid Other | Attending: Obstetrics & Gynecology

## 2020-01-05 DIAGNOSIS — Z3400 Encounter for supervision of normal first pregnancy, unspecified trimester: Secondary | ICD-10-CM

## 2020-01-05 DIAGNOSIS — O26843 Uterine size-date discrepancy, third trimester: Secondary | ICD-10-CM

## 2020-01-05 DIAGNOSIS — O0933 Supervision of pregnancy with insufficient antenatal care, third trimester: Secondary | ICD-10-CM | POA: Diagnosis present

## 2020-01-05 DIAGNOSIS — Z3A31 31 weeks gestation of pregnancy: Secondary | ICD-10-CM

## 2020-01-06 ENCOUNTER — Other Ambulatory Visit: Payer: Self-pay | Admitting: *Deleted

## 2020-01-06 DIAGNOSIS — O0930 Supervision of pregnancy with insufficient antenatal care, unspecified trimester: Secondary | ICD-10-CM

## 2020-01-14 ENCOUNTER — Ambulatory Visit (INDEPENDENT_AMBULATORY_CARE_PROVIDER_SITE_OTHER): Payer: Medicaid Other | Admitting: Family Medicine

## 2020-01-14 ENCOUNTER — Other Ambulatory Visit: Payer: Self-pay

## 2020-01-14 VITALS — BP 131/70 | HR 108 | Wt 182.0 lb

## 2020-01-14 DIAGNOSIS — O99343 Other mental disorders complicating pregnancy, third trimester: Secondary | ICD-10-CM

## 2020-01-14 DIAGNOSIS — R55 Syncope and collapse: Secondary | ICD-10-CM

## 2020-01-14 DIAGNOSIS — O0933 Supervision of pregnancy with insufficient antenatal care, third trimester: Secondary | ICD-10-CM

## 2020-01-14 DIAGNOSIS — F333 Major depressive disorder, recurrent, severe with psychotic symptoms: Secondary | ICD-10-CM

## 2020-01-14 DIAGNOSIS — R42 Dizziness and giddiness: Secondary | ICD-10-CM

## 2020-01-14 DIAGNOSIS — Z3400 Encounter for supervision of normal first pregnancy, unspecified trimester: Secondary | ICD-10-CM

## 2020-01-14 DIAGNOSIS — Z3A32 32 weeks gestation of pregnancy: Secondary | ICD-10-CM

## 2020-01-14 NOTE — Progress Notes (Signed)
   PRENATAL VISIT NOTE  Subjective:  Stephanie Frazier is a 19 y.o. G1P0 at [redacted]w[redacted]d being seen today for ongoing prenatal care.  She is currently monitored for the following issues for this low-risk pregnancy and has ADHD (attention deficit hyperactivity disorder); Migraine without aura and with status migrainosus, not intractable; Episodic tension-type headache, not intractable; Mood disorder ([place]); Insomnia; Nephrolithiasis; Severe recurrent major depression with psychotic features ([place]); Supervision of normal first pregnancy, antepartum; and Late prenatal care affecting pregnancy in third trimester on their problem list.  Patient reports having dizziness and lightheadedness when standing up. drinking a lot of water, feels thirsty. Improved some when she drinks gatorade. Had an episode of seeing floaters, no headache.  Contractions: Not present. Vag. Bleeding: None.  Movement: Present. Denies leaking of fluid.   The following portions of the patient's history were reviewed and updated as appropriate: allergies, current medications, past family history, past medical history, past social history, past surgical history and problem list.   Objective:   Vitals:   01/14/20 1058  BP: 131/70  Pulse: (!) 108  Weight: 182 lb (82.6 kg)    Fetal Status: Fetal Heart Rate (bpm): 148   Movement: Present     General:  Alert, oriented and cooperative. Patient is in no acute distress.  Skin: Skin is warm and dry. No rash noted.   Cardiovascular: Normal heart rate noted  Respiratory: Normal respiratory effort, no problems with respiration noted  Abdomen: Soft, gravid, appropriate for gestational age.  Pain/Pressure: Present     Pelvic: Cervical exam deferred        Extremities: Normal range of motion.  Edema: None  Mental Status: Normal mood and affect. Normal behavior. Normal judgment and thought content.   Assessment and Plan:  Pregnancy: G1P0 at [redacted]w[redacted]d 1. Supervision of normal first pregnancy,  antepartum FHT and FH normal  2. Late prenatal care affecting pregnancy in third trimester  3. Severe recurrent major depression with psychotic features [facility]) Not currently on medication  4. Postural dizziness with presyncope Check labs. Keep hydrated, recommended continuing with drinks with electrolytes for now. - Basic metabolic panel - CBC - Magnesium - TSH  Preterm labor symptoms and general obstetric precautions including but not limited to vaginal bleeding, contractions, leaking of fluid and fetal movement were reviewed in detail with the patient. Please refer to After Visit Summary for other counseling recommendations.   No follow-ups on file.  Future Appointments  Date Time Provider [place]  01/28/2020  8:45 AM Truett Mainland, DO [facility] None  02/02/2020  2:30 PM [facility] NURSE [facility]  02/02/2020  2:30 PM [facility] US3 [facility] [place], DO

## 2020-01-14 NOTE — Progress Notes (Signed)
Patient states that she has noticed some floaters in her visions. Kathrene Alu RN

## 2020-01-15 LAB — CBC
Hematocrit: 34.1 % (ref 34.0–46.6)
Hemoglobin: 11.7 g/dL (ref 11.1–15.9)
MCH: 31.5 pg (ref 26.6–33.0)
MCHC: 34.3 g/dL (ref 31.5–35.7)
MCV: 92 fL (ref 79–97)
Platelets: 205 10*3/uL (ref 150–450)
RBC: 3.72 x10E6/uL — ABNORMAL LOW (ref 3.77–5.28)
RDW: 12.4 % (ref 11.7–15.4)
WBC: 9.8 10*3/uL (ref 3.4–10.8)

## 2020-01-15 LAB — BASIC METABOLIC PANEL
BUN/Creatinine Ratio: 20 (ref 9–23)
BUN: 11 mg/dL (ref 6–20)
CO2: 23 mmol/L (ref 20–29)
Calcium: 9.3 mg/dL (ref 8.7–10.2)
Chloride: 98 mmol/L (ref 96–106)
Creatinine, Ser: 0.56 mg/dL — ABNORMAL LOW (ref 0.57–1.00)
GFR calc Af Amer: 156 mL/min/{1.73_m2} (ref >59)
GFR calc non Af Amer: 136 mL/min/{1.73_m2} (ref >59)
Glucose: 80 mg/dL (ref 65–99)
Potassium: 4.4 mmol/L (ref 3.5–5.2)
Sodium: 134 mmol/L (ref 134–144)

## 2020-01-15 LAB — TSH: TSH: 1.52 u[IU]/mL (ref 0.450–4.500)

## 2020-01-15 LAB — MAGNESIUM: Magnesium: 1.9 mg/dL (ref 1.6–2.3)

## 2020-01-28 ENCOUNTER — Ambulatory Visit (INDEPENDENT_AMBULATORY_CARE_PROVIDER_SITE_OTHER): Payer: Medicaid Other | Admitting: Family Medicine

## 2020-01-28 ENCOUNTER — Other Ambulatory Visit: Payer: Self-pay

## 2020-01-28 VITALS — BP 124/81 | HR 100 | Wt 189.1 lb

## 2020-01-28 DIAGNOSIS — O99340 Other mental disorders complicating pregnancy, unspecified trimester: Secondary | ICD-10-CM

## 2020-01-28 DIAGNOSIS — Z3A34 34 weeks gestation of pregnancy: Secondary | ICD-10-CM

## 2020-01-28 DIAGNOSIS — F419 Anxiety disorder, unspecified: Secondary | ICD-10-CM

## 2020-01-28 DIAGNOSIS — O0933 Supervision of pregnancy with insufficient antenatal care, third trimester: Secondary | ICD-10-CM

## 2020-01-28 DIAGNOSIS — O26843 Uterine size-date discrepancy, third trimester: Secondary | ICD-10-CM

## 2020-01-28 DIAGNOSIS — O99343 Other mental disorders complicating pregnancy, third trimester: Secondary | ICD-10-CM

## 2020-01-28 DIAGNOSIS — Z3400 Encounter for supervision of normal first pregnancy, unspecified trimester: Secondary | ICD-10-CM

## 2020-01-28 MED ORDER — CITALOPRAM HYDROBROMIDE 20 MG PO TABS
20.0000 mg | ORAL_TABLET | Freq: Every day | ORAL | 12 refills | Status: DC
Start: 2020-01-28 — End: 2020-02-02

## 2020-01-28 NOTE — Progress Notes (Signed)
   PRENATAL VISIT NOTE  Subjective:  Stephanie Frazier is a 19 y.o. G1P0 at [redacted]w[redacted]d being seen today for ongoing prenatal care.  She is currently monitored for the following issues for this high-risk pregnancy and has ADHD (attention deficit hyperactivity disorder); Migraine without aura and with status migrainosus, not intractable; Episodic tension-type headache, not intractable; Mood disorder ([place]); Insomnia; Nephrolithiasis; Severe recurrent major depression with psychotic features ([place]); Supervision of normal first pregnancy, antepartum; and Late prenatal care affecting pregnancy in third trimester on their problem list.  Patient reports having increased anxiety. Has long history of anxiety, but never treated. Feeling palpitations, claustraphobic.  Contractions: Not present.  .  Movement: Present. Denies leaking of fluid.   The following portions of the patient's history were reviewed and updated as appropriate: allergies, current medications, past family history, past medical history, past social history, past surgical history and problem list.   Objective:   Vitals:   01/28/20 0848  BP: 124/81  Pulse: 100  Weight: 189 lb 1.9 oz (85.8 kg)    Fetal Status: Fetal Heart Rate (bpm): 152 Fundal Height: 31 cm Movement: Present     General:  Alert, oriented and cooperative. Patient is in no acute distress.  Skin: Skin is warm and dry. No rash noted.   Cardiovascular: Normal heart rate noted  Respiratory: Normal respiratory effort, no problems with respiration noted  Abdomen: Soft, gravid, appropriate for gestational age.  Pain/Pressure: Present     Pelvic: Cervical exam deferred        Extremities: Normal range of motion.  Edema: None  Mental Status: Normal mood and affect. Normal behavior. Normal judgment and thought content.   Assessment and Plan:  Pregnancy: G1P0 at [redacted]w[redacted]d  1. Supervision of normal first pregnancy, antepartum FHT normal  2. Late prenatal care affecting pregnancy in  third trimester   3. Anxiety disorder affecting pregnancy, antepartum Start celexa. Discussed potential side effects.  4. S>D [place] on wednesday  Preterm labor symptoms and general obstetric precautions including but not limited to vaginal bleeding, contractions, leaking of fluid and fetal movement were reviewed in detail with the patient. Please refer to After Visit Summary for other counseling recommendations.   Return in about 2 weeks (around 02/11/2020) for OB f/u.  Future Appointments  Date Time Provider [place]  02/02/2020  2:30 PM [facility] NURSE [facility]  02/02/2020  2:30 PM [facility] US3 [facility]  02/08/2020 10:10 AM Seabron Spates, CNM [facility] None  02/15/2020 10:50 AM Seabron Spates, CNM [facility] None    Truett Mainland, DO

## 2020-01-28 NOTE — Patient Instructions (Signed)
AREA PEDIATRIC/FAMILY PRACTICE PHYSICIANS  [place] ([address]) . [facility] o Chambliss, MD; Eniola, MD; Hale, MD; Hensel, MD; McDiarmid, MD; McIntyer, MD; Neal, MD; Walden, MD o [address] [place], [place] [address] o [phone] o Mon-Fri 8:30-12:30, 1:30-5:00 o Providers come to see babies at [facility] o Accepting Medicaid . [facility] o Limited providers who accept newborns: Koirala, MD; Morrow, MD; Wolters, MD o [address], [place], [place] [address] o [phone] o Mon-Fri 8:00-5:30 o Babies seen by providers at [facility] o Does NOT accept Medicaid o Please call early in hospitalization for appointment (limited availability)  . [facility] o Mulberry, MD o [address] [place], [place] [address] o [phone] o Mon, Tue, Thur, Fri 8:30-5:00, Wed 10:00-7:00 (closed 1-2pm) o Babies seen by [facility] providers o Accepting Medicaid . [facility] o Rubin, MD o [address], [place], [place] [address] o [phone] o Mon-Fri 8:30-5:00, Sat 8:30-12:00 o Provider comes to see babies at [facility] o Accepting Medicaid o Must have been referred from current patients or contacted office prior to delivery . [facility] ([facility]) o Brown, MD; Chandler, MD; Ettefagh, MD; Grant, MD; Lester, MD; McCormick, MD; McQueen, MD; Prose, MD; Simha, MD; Stanley, MD; Stryffeler, NP; Tebben, NP o [address], [place], [place] [address] o [phone] o Mon, Tue, Thur, Fri 8:30-5:30, Wed 9:30-5:30, Sat 8:30-12:30 o Babies seen by [facility] providers o Accepting Medicaid o Only accepting infants of first-time parents or siblings of current patients o Hospital discharge coordinator will make follow-up appointment . Jack Amos o [address],  [place], [place]  [address] o [phone]   Fax - [phone] . [facility] o [address], [place], [place]  [address] o Phone - [phone]   Fax - [phone] . Shilpa Gosrani o [address], [place], [place]  [address] o [phone]  [place] ([address]) . [facility] o Bates, MD; Brassfield, MD; Cooper, Cox, MD; MD; [name], MD; Dovico, MD; Ettefaugh, MD; Little, MD; Lowe, MD; Keiffer, MD; Melvin, MD; Sumner, MD; Williams, MD o [address], [place], [place] [address] o [phone] o Mon-Fri 8:30-5:00 (extended evenings Mon-Thur as needed), Sat-Sun 10:00-1:00 o Providers come to see babies at [facility] o Accepting Medicaid for families of first-time babies and families with all children in the household age 3 and under. Must register with office prior to making appointment (M-F only). . [facility] o Henson, NP; Knapp, MD; Lalonde, MD; Tysinger, PA o [address] [place], [place] [address] o [phone] o Mon-Fri 8:00-5:00 o Babies seen by providers at [facility] o Does NOT accept Medicaid/Commercial Insurance Only . [facility] ([facility])  o Artis, MD; Barnes, MD; Bratton, MD; Coccaro, MD; Lockett Gardner, MD; Kramer, MD; Marshall, MD; Netherton, MD; Poleto, MD; Skinner, MD o [address] [place],  [address] o [phone] o Mon-Fri 8:30-5:30, Sat (Oct.-Mar.) 9:00-1:00 o Babies seen by providers at [facility] o Accepting Medicaid  [place] ([address]) . [facility] o Reid, MD; Warner, MD o [address], ,  [address] o [phone] o Mon-Fri 8:30-5:00, Sat 8:30-12:00 o Providers come to see babies at [facility] o Does NOT accept Medicaid . [facility] o Becker, PA; Hagler, MD; Scifres, PA; Sun, MD; Swayne, MD o [address],  [place], [place] [address] o [phone] o Mon-Fri 8:00-5:00 o Babies seen by providers at [facility] o Does NOT accept Medicaid o Only accepting babies of parents who are patients o Please call early in hospitalization for appointment (limited availability) . [facility] o Clark, MD; Frye, MD; Kelleher, MD; Mack, NP; Miller, MD; O'Keller, MD; Patterson, NP; Pudlo, MD; Puzio, MD; Thomas, MD; Tucker, MD; Twiselton, MD o [address], [place], [place] [address] o [phone] o Mon-Fri 8:00-5:00, Sat 9:00-12:00 o Providers come to see babies at [facility] o Does NOT accept Medicaid  [place] ([address]) . [facility] o Limited providers accepting new patients: Brake, NP; Wharton, PA o [address], [place], [place] [address] o [phone] o Mon-Fri 8:00-5:00 o Babies seen by providers at [facility] o Does NOT accept Medicaid o Only accepting babies of parents who are patients o Please call early in hospitalization for appointment (limited availability) . [facility] o Gay, MD; Quinlan, MD o [address] [place], [place] [address] o [phone] (press 1 to schedule appointment) o Mon-Fri 8:00-5:00 o Providers come to see babies at [facility] o Does NOT accept Medicaid . [facility] o Mazer, MD o [address] [place], [place] [address] o [phone] o Mon-Fri 8:30-5:00 (lunch 12:30-1:00), extended hours by appointment only Wed 5:00-6:30 o Babies seen by [facility] providers o Accepting Medicaid . [facility] o Banks, MD; Jordan, MD; Koberlein, MD o [address], [place], [place] [address] o [phone] o Mon-Fri 8:00-5:00 o Babies seen by [facility] providers o Does NOT accept Medicaid . [facility] o Parker, MD; Hunter, MD; Wallace, DO o [address] [place], [place]  [address] o [phone] o Mon-Fri 8:00-5:00 o Babies seen by [facility] providers o Does NOT accept Medicaid . [facility] o Brandon, PA; Brecken, PA; Christy, NP; Dees, MD; DeClaire, MD; DeWeese, MD; Hansen, NP; Mills, NP; Parrish, NP; Smoot, NP; Summer, MD; Vapne, MD o [address] [place], [place] [address] o [phone] o Mon-Fri 8:30-5:00, Sat 10:00-1:00 o Providers come to see babies at [facility] o Does NOT accept Medicaid o Free prenatal information session Tuesdays at 4:45pm . [facility] o Bouska, MD; Gordon, PA; Jeffery, PA; Weber, PA o [address] [place] [address] o [phone] o Mon-Fri 7:30-5:30 o Babies seen by [facility] providers . [facility] o [address], [place], [place]  [address] o [phone]   Fax - [phone]  [place] ([address]) . [facility] o Reese, MD o [address] [place], [place] [address] o [phone] o Mon-Thur 8:00-6:00 o Providers come to see babies at [facility] o Accepting Medicaid . [facility] o Anderson, NP; Badger, MD; Beal, PA; Spencer, PA o [address] [place], [place] [address] o [phone] o Mon-Thur 7:30-7:30, Fri 7:30-4:30 o Babies seen by [facility] providers o Accepting Medicaid . [facility] o Agbuya, MD; Klett, NP; Romgoolam, MD o [address], [place], [place] [address] o [phone] o Mon-Fri 8:30-5:00, Sat 8:30-12:00 o Providers come to see babies at [facility] o Accepting Medicaid o Must have "Meet & Greet" appointment at office prior to delivery . [facility] o McCord,   MD; Wallace, MD; Wood, MD o [address], [place], [place] [address] o [phone] o Mon-Wed 8:00-6:00, Thur-Fri 8:00-5:00, Sat 9:00-12:00 o Providers come to  see babies at [facility] o Does NOT accept Medicaid o Only accepting siblings of current patients . [facility]  o [address], [place], [place]  [address] o [phone]   Fax - [phone] . [facility] o [address], [place], [place]  [address] o [phone]   Fax - [phone]  [place] ([address] & [address]) . [facility] o Cirigliano, DO; Matthews, DO o [address] [place], [place] [address] o [phone] o Mon-Fri 7:00-5:00 o Babies seen by [facility] providers o Does NOT accept Medicaid . [facility] o Briscoe, MD; Howley, PA; Moreira, PA o [address], [place], [place] [address] o [phone] o Mon-Fri 8:00-5:00 o Babies seen by [facility] providers o Accepting Medicaid . [facility] o Boyd, MD; Church, PA; Jones, NP; Osborn, PA o [address], [place], [place] [address] o [phone] o Mon-Fri 8:00-5:00 o Babies seen by providers at [facility] o Accepting Medicaid  [place] ([address]) . [facility] [place] o Wendling, DO o [address] [place], [place] [address] o [phone] o Mon-Fri 8:00-5:00 o Babies seen by [facility] providers o Does NOT accept Medicaid o Limited availability, please call early in hospitalization to schedule follow-up . [facility] o Calderon, PA; Cummings, MD; Dillard, MD; Martin, PA; Olson, MD; VanDeven, PA o [address], [place], [place] [address] o [phone] o Mon-Fri 8:30-5:00, Sat 9:00-12:00 o Babies seen by providers at [facility] o Accepting Medicaid o Please register online then schedule online or call office o [email or web address] . [facility] o Hunter, NP; Kumar, MD; Martin Rogers, PA o [address], [place], [place] [address] o [phone] o Mon-Fri 8:00-5:00 o Babies seen by providers at [facility] o Accepting Medicaid . [facility] ([facility] o Blanket, MD; Kristi Fleenor, NP; West, MD o [address], [place], [place] [address] o [phone] o Mon-Fri 8:00-5:30, Sat&Sun by appointment (phones open at 8:30) o Babies seen by [facility] providers o Accepting Medicaid o Must be a first-time baby or sibling of current patient . [facility] [place]  o [address], [place], [place]  [address] o [phone]   Fax - [phone]  [place] ([address]) . [facility] o Brown, PA; Cowen, PA; Rice, MD; Helton, PA; Spry, MD o [address] [place], [place] [address] o [phone] o Mon-Thur 8:00-7:00, Fri 8:00-5:00, Sat 8:00-12:00, Sun 9:00-12:00 o Babies seen by [facility] providers o Accepting Medicaid . [facility] o Coe-Goins, MD; Marshall, MD; Pierre-Louis, MD o [address] [place] [address], [place], [place] [address] o [phone] o Mon-Thur 8:00-5:00 o Babies seen by providers at [facility] o Accepting Medicaid . [facility] o Bratton, MD; Coe-Goins, MD; Hayes, MD; Lewis, MD; List, MD; Lott, MD; Marshall, MD; Moran, MD; O'Neal, MD; Pierre-Louis, MD; Pitonzo, MD; Scholer, MD; Spangle, MD o [address] [place],    [address] o [phone] o Mon-Fri 8:00-5:30, Sat (Oct.-Mar.) 9:00-1:00 o Babies seen by providers at [facility] o Accepting Medicaid o Must fill out new patient packet, available online at [email or web address] . [facility] ([facility] o Friddle, NP; Harris, NP; Kelly, NP; Logan, MD;  Melvin, PA; Poth, MD; Ramadoss, MD; Stanton, NP o [address], [place], [place] [address] o [phone] o Mon-Thur 8:00-5:30, Fri 8:00-5:00 o Babies seen by providers at [facility] o Accepting Medicaid  [place] ([address]) . [facility] o Dixon, PA; Brazos, MD; Pickard, MD; Tapia, PA o [address], [place], [place] [address] o [phone] o Mon-Fri 8:00-5:00 o Babies seen by providers at [facility] o Accepting Medicaid   [place] ([address]) . [facility] [place] o Masneri, DO; Meyers, MD; Nelson, PA o [address], [place], [place] [address] o [phone] o Mon-Fri 8:00-5:00 o Babies seen by providers at [facility] o Does NOT accept Medicaid o Limited appointment availability, please call early in hospitalization  . [facility] [place] o Kunedd, DO; McGowen, MD o [address], [place], [place] [address] o [phone] o Mon-Fri 8:00-5:00 o Babies seen by [facility] providers o Does NOT accept Medicaid . [facility] [place] o Cameron, MD; MacDonald, MD; Michaels, PA; Nayak, MD o [address] [place] [address], [place], [place] [address] o [phone] o Mon-Fri 8:00-5:00 o After hours clinic ([address] [place], [place] [address]) [phone] Mon-Fri 5:00-8:00, Sat 12:00-6:00, Sun 10:00-4:00 o Babies seen by [facility] providers o Accepting Medicaid . [facility] [place] o [address], [place], [place]  [address] o [phone]   Fax - [phone]  [place] ([address]) . [facility] [place] [facility] o Andy, MD o [address], [place], [place] [address] o [phone] o Mon-Fri 8:00-5:00 o Babies seen by [facility] providers o Does NOT accept Medicaid . [facility] [place] ([facility] [place]) o Eksir, MD o [address], [place], [place]  [address] o [phone] o Mon-Thur 8:00-7:00, Fri 8:00-5:00, Sat 8:00-12:00 o Babies seen by providers at [facility] o Accepting Medicaid - but does not have vaccinations in office (must be received elsewhere) o Limited availability, please call early in hospitalization  [place] ([address]) . [facility]  o Charlene Flemming, MD o [address], [place]  [address] o [phone]  Fax [phone]   

## 2020-01-28 NOTE — Progress Notes (Signed)
Peds list.

## 2020-02-02 ENCOUNTER — Telehealth: Payer: Self-pay

## 2020-02-02 ENCOUNTER — Ambulatory Visit: Payer: Medicaid Other | Attending: Obstetrics

## 2020-02-02 ENCOUNTER — Ambulatory Visit: Payer: Medicaid Other | Admitting: *Deleted

## 2020-02-02 ENCOUNTER — Other Ambulatory Visit: Payer: Self-pay

## 2020-02-02 DIAGNOSIS — Z3A35 35 weeks gestation of pregnancy: Secondary | ICD-10-CM

## 2020-02-02 DIAGNOSIS — Z3400 Encounter for supervision of normal first pregnancy, unspecified trimester: Secondary | ICD-10-CM | POA: Insufficient documentation

## 2020-02-02 DIAGNOSIS — O0930 Supervision of pregnancy with insufficient antenatal care, unspecified trimester: Secondary | ICD-10-CM | POA: Diagnosis not present

## 2020-02-02 DIAGNOSIS — O0933 Supervision of pregnancy with insufficient antenatal care, third trimester: Secondary | ICD-10-CM | POA: Diagnosis not present

## 2020-02-02 DIAGNOSIS — Z362 Encounter for other antenatal screening follow-up: Secondary | ICD-10-CM | POA: Diagnosis not present

## 2020-02-02 MED ORDER — CITALOPRAM HYDROBROMIDE 20 MG PO TABS
20.0000 mg | ORAL_TABLET | Freq: Every day | ORAL | 12 refills | Status: DC
Start: 2020-02-02 — End: 2021-10-05

## 2020-02-02 NOTE — Telephone Encounter (Signed)
Patient called and needs her celexa script to go to [facility] in [place] [address] Kathrene Alu RN

## 2020-02-08 ENCOUNTER — Other Ambulatory Visit (HOSPITAL_COMMUNITY)
Admission: RE | Admit: 2020-02-08 | Discharge: 2020-02-08 | Disposition: A | Discharge status: Home / Self Care | Payer: Medicaid Other | Source: Ambulatory Visit | Attending: Advanced Practice Midwife | Admitting: Advanced Practice Midwife

## 2020-02-08 ENCOUNTER — Encounter: Payer: Self-pay | Admitting: Advanced Practice Midwife

## 2020-02-08 ENCOUNTER — Ambulatory Visit (INDEPENDENT_AMBULATORY_CARE_PROVIDER_SITE_OTHER): Payer: Medicaid Other | Admitting: Advanced Practice Midwife

## 2020-02-08 ENCOUNTER — Other Ambulatory Visit: Payer: Self-pay

## 2020-02-08 VITALS — BP 130/76 | HR 78 | Wt 191.0 lb

## 2020-02-08 DIAGNOSIS — R109 Unspecified abdominal pain: Secondary | ICD-10-CM

## 2020-02-08 DIAGNOSIS — O26893 Other specified pregnancy related conditions, third trimester: Secondary | ICD-10-CM

## 2020-02-08 DIAGNOSIS — Z3400 Encounter for supervision of normal first pregnancy, unspecified trimester: Secondary | ICD-10-CM

## 2020-02-08 DIAGNOSIS — Z3A36 36 weeks gestation of pregnancy: Secondary | ICD-10-CM

## 2020-02-08 NOTE — Progress Notes (Signed)
   PRENATAL VISIT NOTE  Subjective:  Stephanie Frazier is a 19 y.o. G1P0 at [redacted]w[redacted]d being seen today for ongoing prenatal care.  She is currently monitored for the following issues for this low-risk pregnancy and has ADHD (attention deficit hyperactivity disorder); Migraine without aura and with status migrainosus, not intractable; Episodic tension-type headache, not intractable; Mood disorder (Stephanie Frazier); Insomnia; Flank pain, acute; Nephrolithiasis; Severe recurrent major depression with psychotic features ([place]ie Frazier); Supervision of normal first pregnancy, antepartum; and Late prenatal care affecting pregnancy in third trimester on their problem list.  Patient reports occasional flank pain on right, intermittent BH contractions.  Contractions: Not present. Vag. Bleeding: None.  Movement: Present. Denies leaking of fluid.   The following portions of the patient's history were reviewed and updated as appropriate: allergies, current medications, past family history, past medical history, past social history, past surgical history and problem list.   Objective:   Vitals:   02/08/20 1020  BP: 130/76  Pulse: 78  Weight: 191 lb (86.6 kg)    Fetal Status: Fetal Heart Rate (bpm): 155   Movement: Present     General:  Alert, oriented and cooperative. Patient is in no acute distress.  Skin: Skin is warm and dry. No rash noted.   Cardiovascular: Normal heart rate noted  Respiratory: Normal respiratory effort, no problems with respiration noted  Abdomen: Soft, gravid, appropriate for gestational age.  Pain/Pressure: Present     Pelvic: Cervical exam performed in the presence of a chaperone      Closed/70%/-1/vertex  Extremities: Normal range of motion.  Edema: None  Mental Status: Normal mood and affect. Normal behavior. Normal judgment and thought content.   Assessment and Plan:  Pregnancy: G1P0 at [redacted]w[redacted]d 1. Supervision of normal first pregnancy, antepartum      Labor signs reviewed  - Culture, beta strep  (group b only) - GC/Chlamydia probe amp ([place])not at [facility]  2. Flank pain     [place] in MAU normal,   May be muscle spasm, recomm trying ice therapy  Preterm labor symptoms and general obstetric precautions including but not limited to vaginal bleeding, contractions, leaking of fluid and fetal movement were reviewed in detail with the patient. Please refer to After Visit Summary for other counseling recommendations.   Return in about 1 week (around 02/15/2020) for [facility].  Future Appointments  Date Time Provider [place]  02/15/2020 10:50 AM Seabron Spates, CNM [facility] None    Hansel Feinstein, CNM

## 2020-02-08 NOTE — Patient Instructions (Signed)
Third Trimester of Pregnancy The third trimester is from week 28 through week 40 (months 7 through 9). The third trimester is a time when the unborn baby (fetus) is growing rapidly. At the end of the ninth month, the fetus is about 20 inches in length and weighs 6-10 pounds. Body changes during your third trimester Your body will continue to go through many changes during pregnancy. The changes vary from woman to woman. During the third trimester:  Your weight will continue to increase. You can expect to gain 25-35 pounds (11-16 kg) by the end of the pregnancy.  You may begin to get stretch marks on your hips, abdomen, and breasts.  You may urinate more often because the fetus is moving lower into your pelvis and pressing on your bladder.  You may develop or continue to have heartburn. This is caused by increased hormones that slow down muscles in the digestive tract.  You may develop or continue to have constipation because increased hormones slow digestion and cause the muscles that push waste through your intestines to relax.  You may develop hemorrhoids. These are swollen veins (varicose veins) in the rectum that can itch or be painful.  You may develop swollen, bulging veins (varicose veins) in your legs.  You may have increased body aches in the pelvis, back, or thighs. This is due to weight gain and increased hormones that are relaxing your joints.  You may have changes in your hair. These can include thickening of your hair, rapid growth, and changes in texture. Some women also have hair loss during or after pregnancy, or hair that feels dry or thin. Your hair will most likely return to normal after your baby is born.  Your breasts will continue to grow and they will continue to become tender. A yellow fluid (colostrum) may leak from your breasts. This is the first milk you are producing for your baby.  Your belly button may stick out.  You may notice more swelling in your hands,  face, or ankles.  You may have increased tingling or numbness in your hands, arms, and legs. The skin on your belly may also feel numb.  You may feel short of breath because of your expanding uterus.  You may have more problems sleeping. This can be caused by the size of your belly, increased need to urinate, and an increase in your body's metabolism.  You may notice the fetus "dropping," or moving lower in your abdomen (lightening).  You may have increased vaginal discharge.  You may notice your joints feel loose and you may have pain around your pelvic bone. What to expect at prenatal visits You will have prenatal exams every 2 weeks until week 36. Then you will have weekly prenatal exams. During a routine prenatal visit:  You will be weighed to make sure you and the baby are growing normally.  Your blood pressure will be taken.  Your abdomen will be measured to track your baby's growth.  The fetal heartbeat will be listened to.  Any test results from the previous visit will be discussed.  You may have a cervical check near your due date to see if your cervix has softened or thinned (effaced).  You will be tested for Group B streptococcus. This happens between 35 and 37 weeks. Your health care provider may ask you:  What your birth plan is.  How you are feeling.  If you are feeling the baby move.  If you have had any abnormal   symptoms, such as leaking fluid, bleeding, severe headaches, or abdominal cramping.  If you are using any tobacco products, including cigarettes, chewing tobacco, and electronic cigarettes.  If you have any questions. Other tests or screenings that may be performed during your third trimester include:  Blood tests that check for low iron levels (anemia).  Fetal testing to check the health, activity level, and growth of the fetus. Testing is done if you have certain medical conditions or if there are problems during the pregnancy.  Nonstress test  (NST). This test checks the health of your baby to make sure there are no signs of problems, such as the baby not getting enough oxygen. During this test, a belt is placed around your belly. The baby is made to move, and its heart rate is monitored during movement. What is false labor? False labor is a condition in which you feel small, irregular tightenings of the muscles in the womb (contractions) that usually go away with rest, changing position, or drinking water. These are called Braxton Hicks contractions. Contractions may last for hours, days, or even weeks before true labor sets in. If contractions come at regular intervals, become more frequent, increase in intensity, or become painful, you should see your health care provider. What are the signs of labor?  Abdominal cramps.  Regular contractions that start at 10 minutes apart and become stronger and more frequent with time.  Contractions that start on the top of the uterus and spread down to the lower abdomen and back.  Increased pelvic pressure and dull back pain.  A watery or bloody mucus discharge that comes from the vagina.  Leaking of amniotic fluid. This is also known as your "water breaking." It could be a slow trickle or a gush. Let your health care provider know if it has a color or strange odor. If you have any of these signs, call your health care provider right away, even if it is before your due date. Follow these instructions at home: Medicines  Follow your health care provider's instructions regarding medicine use. Specific medicines may be either safe or unsafe to take during pregnancy.  Take a prenatal vitamin that contains at least 600 micrograms (mcg) of folic acid.  If you develop constipation, try taking a stool softener if your health care provider approves. Eating and drinking   Eat a balanced diet that includes fresh fruits and vegetables, whole grains, good sources of protein such as meat, eggs, or tofu,  and low-fat dairy. Your health care provider will help you determine the amount of weight gain that is right for you.  Avoid raw meat and uncooked cheese. These carry germs that can cause birth defects in the baby.  If you have low calcium intake from food, talk to your health care provider about whether you should take a daily calcium supplement.  Eat four or five small meals rather than three large meals a day.  Limit foods that are high in fat and processed sugars, such as fried and sweet foods.  To prevent constipation: ? Drink enough fluid to keep your urine clear or pale yellow. ? Eat foods that are high in fiber, such as fresh fruits and vegetables, whole grains, and beans. Activity  Exercise only as directed by your health care provider. Most women can continue their usual exercise routine during pregnancy. Try to exercise for 30 minutes at least 5 days a week. Stop exercising if you experience uterine contractions.  Avoid heavy lifting.  Do   not exercise in extreme heat or humidity, or at high altitudes.  Wear low-heel, comfortable shoes.  Practice good posture.  You may continue to have sex unless your health care provider tells you otherwise. Relieving pain and discomfort  Take frequent breaks and rest with your legs elevated if you have leg cramps or low back pain.  Take warm sitz baths to soothe any pain or discomfort caused by hemorrhoids. Use hemorrhoid cream if your health care provider approves.  Wear a good support bra to prevent discomfort from breast tenderness.  If you develop varicose veins: ? Wear support pantyhose or compression stockings as told by your healthcare provider. ? Elevate your feet for 15 minutes, 3-4 times a day. Prenatal care  Write down your questions. Take them to your prenatal visits.  Keep all your prenatal visits as told by your health care provider. This is important. Safety  Wear your seat belt at all times when driving.  Make  a list of emergency phone numbers, including numbers for family, friends, the hospital, and police and fire departments. General instructions  Avoid cat litter boxes and soil used by cats. These carry germs that can cause birth defects in the baby. If you have a cat, ask someone to clean the litter box for you.  Do not travel far distances unless it is absolutely necessary and only with the approval of your health care provider.  Do not use hot tubs, steam rooms, or saunas.  Do not drink alcohol.  Do not use any products that contain nicotine or tobacco, such as cigarettes and e-cigarettes. If you need help quitting, ask your health care provider.  Do not use any medicinal herbs or unprescribed drugs. These chemicals affect the formation and growth of the baby.  Do not douche or use tampons or scented sanitary pads.  Do not cross your legs for long periods of time.  To prepare for the arrival of your baby: ? Take prenatal classes to understand, practice, and ask questions about labor and delivery. ? Make a trial run to the hospital. ? Visit the hospital and tour the maternity area. ? Arrange for maternity or paternity leave through employers. ? Arrange for family and friends to take care of pets while you are in the hospital. ? Purchase a rear-facing car seat and make sure you know how to install it in your car. ? Pack your hospital bag. ? Prepare the baby's nursery. Make sure to remove all pillows and stuffed animals from the baby's crib to prevent suffocation.  Visit your dentist if you have not gone during your pregnancy. Use a soft toothbrush to brush your teeth and be gentle when you floss. Contact a health care provider if:  You are unsure if you are in labor or if your water has broken.  You become dizzy.  You have mild pelvic cramps, pelvic pressure, or nagging pain in your abdominal area.  You have lower back pain.  You have persistent nausea, vomiting, or  diarrhea.  You have an unusual or bad smelling vaginal discharge.  You have pain when you urinate. Get help right away if:  Your water breaks before 37 weeks.  You have regular contractions less than 5 minutes apart before 37 weeks.  You have a fever.  You are leaking fluid from your vagina.  You have spotting or bleeding from your vagina.  You have severe abdominal pain or cramping.  You have rapid weight loss or weight gain.  You have   shortness of breath with chest pain.  You notice sudden or extreme swelling of your face, hands, ankles, feet, or legs.  Your baby makes fewer than 10 movements in 2 hours.  You have severe headaches that do not go away when you take medicine.  You have vision changes. Summary  The third trimester is from week 28 through week 40, months 7 through 9. The third trimester is a time when the unborn baby (fetus) is growing rapidly.  During the third trimester, your discomfort may increase as you and your baby continue to gain weight. You may have abdominal, leg, and back pain, sleeping problems, and an increased need to urinate.  During the third trimester your breasts will keep growing and they will continue to become tender. A yellow fluid (colostrum) may leak from your breasts. This is the first milk you are producing for your baby.  False labor is a condition in which you feel small, irregular tightenings of the muscles in the womb (contractions) that eventually go away. These are called Braxton Hicks contractions. Contractions may last for hours, days, or even weeks before true labor sets in.  Signs of labor can include: abdominal cramps; regular contractions that start at 10 minutes apart and become stronger and more frequent with time; watery or bloody mucus discharge that comes from the vagina; increased pelvic pressure and dull back pain; and leaking of amniotic fluid. This information is not intended to replace advice given to you by your  health care provider. Make sure you discuss any questions you have with your health care provider. Document Revised: 10/29/2018 Document Reviewed: 08/13/2016 Elsevier Patient Education  2020 [employer]  

## 2020-02-09 LAB — GC/CHLAMYDIA PROBE AMP (~~LOC~~) NOT AT ARMC
Chlamydia: NEGATIVE
Comment: NEGATIVE
Comment: NORMAL
Neisseria Gonorrhea: NEGATIVE

## 2020-02-12 LAB — CULTURE, BETA STREP (GROUP B ONLY): Strep Gp B Culture: NEGATIVE

## 2020-02-15 ENCOUNTER — Encounter: Payer: Self-pay | Admitting: Advanced Practice Midwife

## 2020-02-15 ENCOUNTER — Other Ambulatory Visit: Payer: Self-pay

## 2020-02-15 ENCOUNTER — Ambulatory Visit (INDEPENDENT_AMBULATORY_CARE_PROVIDER_SITE_OTHER): Payer: Medicaid Other | Admitting: Advanced Practice Midwife

## 2020-02-15 VITALS — BP 130/74 | HR 80 | Wt 192.0 lb

## 2020-02-15 DIAGNOSIS — F419 Anxiety disorder, unspecified: Secondary | ICD-10-CM

## 2020-02-15 DIAGNOSIS — O99340 Other mental disorders complicating pregnancy, unspecified trimester: Secondary | ICD-10-CM

## 2020-02-15 DIAGNOSIS — Z3A37 37 weeks gestation of pregnancy: Secondary | ICD-10-CM

## 2020-02-15 DIAGNOSIS — Z3400 Encounter for supervision of normal first pregnancy, unspecified trimester: Secondary | ICD-10-CM

## 2020-02-15 DIAGNOSIS — Z3401 Encounter for supervision of normal first pregnancy, first trimester: Secondary | ICD-10-CM

## 2020-02-15 NOTE — Patient Instructions (Signed)

## 2020-02-15 NOTE — Progress Notes (Signed)
   PRENATAL VISIT NOTE  Subjective:  Stephanie Frazier is a 19 y.o. G1P0 at [redacted]w[redacted]d being seen today for ongoing prenatal care.  She is currently monitored for the following issues for this low-risk pregnancy and has ADHD (attention deficit hyperactivity disorder); Migraine without aura and with status migrainosus, not intractable; Episodic tension-type headache, not intractable; Mood disorder ([place]); Insomnia; Flank pain, acute; Nephrolithiasis; Severe recurrent major depression with psychotic features ([place]); Supervision of normal first pregnancy, antepartum; and Late prenatal care affecting pregnancy in third trimester on their problem list.  Patient reports intermittent pain in groin and SP joint.  Contractions: Not present. Vag. Bleeding: None.  Movement: Present. Denies leaking of fluid.   The following portions of the patient's history were reviewed and updated as appropriate: allergies, current medications, past family history, past medical history, past social history, past surgical history and problem list.   Objective:   Vitals:   02/15/20 1053  BP: (!) 130/74  Pulse: 80  Weight: 192 lb (87.1 kg)    Fetal Status: Fetal Heart Rate (bpm): 148   Movement: Present     General:  Alert, oriented and cooperative. Patient is in no acute distress.  Skin: Skin is warm and dry. No rash noted.   Cardiovascular: Normal heart rate noted  Respiratory: Normal respiratory effort, no problems with respiration noted  Abdomen: Soft, gravid, appropriate for gestational age.  Pain/Pressure: Present     Pelvic: Cervical exam deferred        Extremities: Normal range of motion.  Edema: None  Mental Status: Normal mood and affect. Normal behavior. Normal judgment and thought content.   Assessment and Plan:  Pregnancy: G1P0 at [redacted]w[redacted]d 1. Supervision of normal first pregnancy, antepartum     Reviewed how to go to MAU, signs of labor, Fetal movement.  2. Anxiety disorder affecting pregnancy, antepartum      Coping well, excited to hold her baby  Term labor symptoms and general obstetric precautions including but not limited to vaginal bleeding, contractions, leaking of fluid and fetal movement were reviewed in detail with the patient. Please refer to After Visit Summary for other counseling recommendations.    Future Appointments  Date Time Provider [place]  02/24/2020  9:15 AM Truett Mainland, DO [id number] None    Hansel Feinstein, CNM

## 2020-02-24 ENCOUNTER — Other Ambulatory Visit: Payer: Self-pay

## 2020-02-24 ENCOUNTER — Ambulatory Visit (INDEPENDENT_AMBULATORY_CARE_PROVIDER_SITE_OTHER): Payer: Medicaid Other | Admitting: Family Medicine

## 2020-02-24 VITALS — BP 128/80 | HR 74 | Wt 193.0 lb

## 2020-02-24 DIAGNOSIS — Z3400 Encounter for supervision of normal first pregnancy, unspecified trimester: Secondary | ICD-10-CM

## 2020-02-24 DIAGNOSIS — F333 Major depressive disorder, recurrent, severe with psychotic symptoms: Secondary | ICD-10-CM

## 2020-02-24 DIAGNOSIS — F419 Anxiety disorder, unspecified: Secondary | ICD-10-CM

## 2020-02-24 DIAGNOSIS — O99340 Other mental disorders complicating pregnancy, unspecified trimester: Secondary | ICD-10-CM

## 2020-02-24 NOTE — Progress Notes (Signed)
   PRENATAL VISIT NOTE  Subjective:  Stephanie Frazier is a 19 y.o. G1P0 at [redacted]w[redacted]d being seen today for ongoing prenatal care.  She is currently monitored for the following issues for this low-risk pregnancy and has ADHD (attention deficit hyperactivity disorder); Migraine without aura and with status migrainosus, not intractable; Episodic tension-type headache, not intractable; Mood disorder ([place]); Insomnia; Flank pain, acute; Nephrolithiasis; Severe recurrent major depression with psychotic features ([place]); Supervision of normal first pregnancy, antepartum; and Late prenatal care affecting pregnancy in third trimester on their problem list.  Patient reports occasional contractions.  Contractions: Irritability. Vag. Bleeding: None.  Movement: Present. Denies leaking of fluid.   The following portions of the patient's history were reviewed and updated as appropriate: allergies, current medications, past family history, past medical history, past social history, past surgical history and problem list.   Objective:   Vitals:   02/24/20 0926  BP: 128/80  Pulse: 74  Weight: 193 lb (87.5 kg)    Fetal Status: Fetal Heart Rate (bpm): 140   Movement: Present     General:  Alert, oriented and cooperative. Patient is in no acute distress.  Skin: Skin is warm and dry. No rash noted.   Cardiovascular: Normal heart rate noted  Respiratory: Normal respiratory effort, no problems with respiration noted  Abdomen: Soft, gravid, appropriate for gestational age.  Pain/Pressure: Present     Pelvic: Cervical exam deferred        Extremities: Normal range of motion.  Edema: None  Mental Status: Normal mood and affect. Normal behavior. Normal judgment and thought content.   Assessment and Plan:  Pregnancy: G1P0 at [redacted]w[redacted]d  1. Supervision of normal first pregnancy, antepartum FHT and FH normal  2. Severe recurrent major depression with psychotic features ([place]) On celexa  3. Anxiety disorder affecting  pregnancy, antepartum   Term labor symptoms and general obstetric precautions including but not limited to vaginal bleeding, contractions, leaking of fluid and fetal movement were reviewed in detail with the patient. Please refer to After Visit Summary for other counseling recommendations.   Return in about 1 week (around 03/02/2020).  No future appointments.  Truett Mainland, DO

## 2020-02-29 ENCOUNTER — Inpatient Hospital Stay (HOSPITAL_COMMUNITY): Payer: Medicaid Other | Admitting: Anesthesiology

## 2020-02-29 ENCOUNTER — Inpatient Hospital Stay (HOSPITAL_COMMUNITY)
Admission: AD | Admit: 2020-02-29 | Discharge: 2020-03-02 | DRG: 807 | Disposition: A | Discharge status: Home / Self Care | Payer: Medicaid Other | Attending: Family Medicine | Admitting: Family Medicine

## 2020-02-29 ENCOUNTER — Encounter (HOSPITAL_COMMUNITY): Payer: Self-pay | Admitting: Obstetrics and Gynecology

## 2020-02-29 ENCOUNTER — Other Ambulatory Visit: Payer: Self-pay

## 2020-02-29 DIAGNOSIS — O99344 Other mental disorders complicating childbirth: Secondary | ICD-10-CM | POA: Diagnosis present

## 2020-02-29 DIAGNOSIS — O26893 Other specified pregnancy related conditions, third trimester: Secondary | ICD-10-CM | POA: Diagnosis present

## 2020-02-29 DIAGNOSIS — Z3A39 39 weeks gestation of pregnancy: Secondary | ICD-10-CM

## 2020-02-29 DIAGNOSIS — F419 Anxiety disorder, unspecified: Secondary | ICD-10-CM | POA: Diagnosis present

## 2020-02-29 DIAGNOSIS — F329 Major depressive disorder, single episode, unspecified: Secondary | ICD-10-CM | POA: Diagnosis present

## 2020-02-29 DIAGNOSIS — R55 Syncope and collapse: Secondary | ICD-10-CM | POA: Diagnosis not present

## 2020-02-29 DIAGNOSIS — Z20822 Contact with and (suspected) exposure to covid-19: Secondary | ICD-10-CM | POA: Diagnosis present

## 2020-02-29 DIAGNOSIS — Z79899 Other long term (current) drug therapy: Secondary | ICD-10-CM

## 2020-02-29 DIAGNOSIS — O139 Gestational [pregnancy-induced] hypertension without significant proteinuria, unspecified trimester: Secondary | ICD-10-CM

## 2020-02-29 DIAGNOSIS — O99893 Other specified diseases and conditions complicating puerperium: Secondary | ICD-10-CM | POA: Diagnosis not present

## 2020-02-29 DIAGNOSIS — Z7722 Contact with and (suspected) exposure to environmental tobacco smoke (acute) (chronic): Secondary | ICD-10-CM | POA: Diagnosis present

## 2020-02-29 DIAGNOSIS — O134 Gestational [pregnancy-induced] hypertension without significant proteinuria, complicating childbirth: Secondary | ICD-10-CM | POA: Diagnosis present

## 2020-02-29 DIAGNOSIS — Z3400 Encounter for supervision of normal first pregnancy, unspecified trimester: Secondary | ICD-10-CM

## 2020-02-29 HISTORY — DX: Gestational (pregnancy-induced) hypertension without significant proteinuria, unspecified trimester: O13.9

## 2020-02-29 LAB — COMPREHENSIVE METABOLIC PANEL
ALT: 15 U/L (ref 0–44)
AST: 20 U/L (ref 15–41)
Albumin: 2.5 g/dL — ABNORMAL LOW (ref 3.5–5.0)
Alkaline Phosphatase: 100 U/L (ref 38–126)
Anion gap: 10 (ref 5–15)
BUN: 12 mg/dL (ref 6–20)
CO2: 21 mmol/L — ABNORMAL LOW (ref 22–32)
Calcium: 9 mg/dL (ref 8.9–10.3)
Chloride: 105 mmol/L (ref 98–111)
Creatinine, Ser: 0.57 mg/dL (ref 0.44–1.00)
GFR calc Af Amer: >60 mL/min (ref >60)
GFR calc non Af Amer: >60 mL/min (ref >60)
Glucose, Bld: 99 mg/dL (ref 70–99)
Potassium: 4.1 mmol/L (ref 3.5–5.1)
Sodium: 136 mmol/L (ref 135–145)
Total Bilirubin: <0.1 mg/dL — ABNORMAL LOW (ref 0.3–1.2)
Total Protein: 6.3 g/dL — ABNORMAL LOW (ref 6.5–8.1)

## 2020-02-29 LAB — URINALYSIS, ROUTINE W REFLEX MICROSCOPIC
Bilirubin Urine: NEGATIVE
Glucose, UA: NEGATIVE mg/dL
Hgb urine dipstick: NEGATIVE
Ketones, ur: NEGATIVE mg/dL
Nitrite: NEGATIVE
Protein, ur: NEGATIVE mg/dL
Specific Gravity, Urine: 1.018 (ref 1.005–1.030)
pH: 6 (ref 5.0–8.0)

## 2020-02-29 LAB — RPR: RPR Ser Ql: NONREACTIVE

## 2020-02-29 LAB — CBC
HCT: 36.1 % (ref 36.0–46.0)
HCT: 35.3 % — ABNORMAL LOW (ref 36.0–46.0)
Hemoglobin: 12.1 g/dL (ref 12.0–15.0)
Hemoglobin: 11.5 g/dL — ABNORMAL LOW (ref 12.0–15.0)
MCH: 30.9 pg (ref 26.0–34.0)
MCH: 30.3 pg (ref 26.0–34.0)
MCHC: 33.5 g/dL (ref 30.0–36.0)
MCHC: 32.6 g/dL (ref 30.0–36.0)
MCV: 92.1 fL (ref 80.0–100.0)
MCV: 93.1 fL (ref 80.0–100.0)
Platelets: 206 10*3/uL (ref 150–400)
Platelets: 198 10*3/uL (ref 150–400)
RBC: 3.92 MIL/uL (ref 3.87–5.11)
RBC: 3.79 MIL/uL — ABNORMAL LOW (ref 3.87–5.11)
RDW: 13.2 % (ref 11.5–15.5)
RDW: 13.3 % (ref 11.5–15.5)
WBC: 9.7 10*3/uL (ref 4.0–10.5)
WBC: 9.6 10*3/uL (ref 4.0–10.5)
nRBC: 0 % (ref 0.0–0.2)
nRBC: 0 % (ref 0.0–0.2)

## 2020-02-29 LAB — SARS CORONAVIRUS 2 BY RT PCR (HOSPITAL ORDER, PERFORMED IN ~~LOC~~ HOSPITAL LAB): SARS Coronavirus 2: NEGATIVE

## 2020-02-29 LAB — PROTEIN / CREATININE RATIO, URINE
Creatinine, Urine: 84.44 mg/dL
Protein Creatinine Ratio: 0.26 mg/mg{Cre} — ABNORMAL HIGH (ref 0.00–0.15)
Total Protein, Urine: 22 mg/dL

## 2020-02-29 LAB — TYPE AND SCREEN
ABO/RH(D): O POS
Antibody Screen: NEGATIVE

## 2020-02-29 MED ORDER — TERBUTALINE SULFATE 1 MG/ML IJ SOLN: 0.2500 mg | Freq: Once | INTRAMUSCULAR | Status: DC | PRN

## 2020-02-29 MED ORDER — PHENYLEPHRINE 40 MCG/ML (10ML) SYRINGE FOR IV PUSH (FOR BLOOD PRESSURE SUPPORT): 80.0000 ug | PREFILLED_SYRINGE | INTRAVENOUS | Status: DC | PRN

## 2020-02-29 MED ORDER — SODIUM CHLORIDE (PF) 0.9 % IJ SOLN
INTRAMUSCULAR | Status: DC | PRN
  Administered 2020-02-29: 12 mL/h via EPIDURAL

## 2020-02-29 MED ORDER — MISOPROSTOL 50MCG HALF TABLET
50.0000 ug | ORAL_TABLET | ORAL | Status: DC | PRN
  Administered 2020-02-29: 50 ug via BUCCAL
  Filled 2020-02-29: qty 1

## 2020-02-29 MED ORDER — LACTATED RINGERS AMNIOINFUSION: INTRAVENOUS | Status: DC

## 2020-02-29 MED ORDER — DIPHENHYDRAMINE HCL 50 MG/ML IJ SOLN: 12.5000 mg | INTRAMUSCULAR | Status: DC | PRN

## 2020-02-29 MED ORDER — LIDOCAINE HCL (PF) 1 % IJ SOLN
INTRAMUSCULAR | Status: DC | PRN
  Administered 2020-02-29: 6 mL via EPIDURAL

## 2020-02-29 MED ORDER — FENTANYL CITRATE (PF) 100 MCG/2ML IJ SOLN
50.0000 ug | Freq: Once | INTRAMUSCULAR | Status: AC
  Administered 2020-02-29: 50 ug via INTRAVENOUS
  Filled 2020-02-29: qty 2

## 2020-02-29 MED ORDER — OXYTOCIN BOLUS FROM INFUSION
333.0000 mL | Freq: Once | INTRAVENOUS | Status: AC
  Administered 2020-02-29: 333 mL via INTRAVENOUS

## 2020-02-29 MED ORDER — FENTANYL-BUPIVACAINE-NACL 0.5-0.125-0.9 MG/250ML-% EP SOLN
12.0000 mL/h | EPIDURAL | Status: DC | PRN
  Filled 2020-02-29: qty 250

## 2020-02-29 MED ORDER — FENTANYL CITRATE (PF) 100 MCG/2ML IJ SOLN
50.0000 ug | INTRAMUSCULAR | Status: DC | PRN
  Administered 2020-02-29 (×2): 50 ug via INTRAVENOUS
  Filled 2020-02-29: qty 2

## 2020-02-29 MED ORDER — EPHEDRINE 5 MG/ML INJ: 10.0000 mg | INTRAVENOUS | Status: DC | PRN

## 2020-02-29 MED ORDER — OXYTOCIN-SODIUM CHLORIDE 30-0.9 UT/500ML-% IV SOLN
2.5000 [IU]/h | INTRAVENOUS | Status: DC
  Filled 2020-02-29: qty 500

## 2020-02-29 MED ORDER — LIDOCAINE HCL (PF) 1 % IJ SOLN: 30.0000 mL | INTRAMUSCULAR | Status: DC | PRN

## 2020-02-29 MED ORDER — OXYTOCIN-SODIUM CHLORIDE 30-0.9 UT/500ML-% IV SOLN
1.0000 m[IU]/min | INTRAVENOUS | Status: DC
  Administered 2020-02-29 (×2): 2 m[IU]/min via INTRAVENOUS

## 2020-02-29 MED ORDER — LACTATED RINGERS IV SOLN: INTRAVENOUS | Status: DC

## 2020-02-29 MED ORDER — TERBUTALINE SULFATE 1 MG/ML IJ SOLN
0.2500 mg | Freq: Once | INTRAMUSCULAR | Status: AC | PRN
  Administered 2020-02-29: 0.25 mg via SUBCUTANEOUS
  Filled 2020-02-29: qty 1

## 2020-02-29 MED ORDER — ACETAMINOPHEN 325 MG PO TABS: 650.0000 mg | ORAL_TABLET | ORAL | Status: DC | PRN

## 2020-02-29 MED ORDER — SOD CITRATE-CITRIC ACID 500-334 MG/5ML PO SOLN: 30.0000 mL | ORAL | Status: DC | PRN

## 2020-02-29 MED ORDER — PROMETHAZINE HCL 25 MG/ML IJ SOLN: 25.0000 mg | INTRAMUSCULAR | Status: DC | PRN

## 2020-02-29 MED ORDER — LACTATED RINGERS IV SOLN
500.0000 mL | INTRAVENOUS | Status: DC | PRN
  Administered 2020-03-01: 500 mL via INTRAVENOUS

## 2020-02-29 MED ORDER — LACTATED RINGERS IV SOLN: 500.0000 mL | Freq: Once | INTRAVENOUS | Status: DC

## 2020-02-29 NOTE — [facility]U Provider Note (Signed)
History     CSN: [id number]  Arrival date and time: 02/29/20 4196   First Provider Initiated Contact with Patient 02/29/20 [phone]      Chief Complaint  Patient presents with  . Abdominal Pain   HPI Stephanie Frazier is a 19 y.o. G1P0 at [redacted]w[redacted]d who presents to MAU with chief complaint irregular abdominal cramping. She denies vaginal bleeding, leaking of fluid, decreased fetal movement, fever, falls, or recent illness.   Patient denies history of elevated blood pressure. She endorses history of migraines accompanied by blurry vision but denies both symptoms at this time. She denies RUQ/epigastric pain, new onset swelling or weight gain.  Patient receives care with [facility] and her next appointment is 03/03/2020.   OB History    Gravida  1   Para      Term      Preterm      AB      Living  0     SAB      TAB      Ectopic      Multiple      Live Births              Past Medical History:  Diagnosis Date  . Allergy   . Anxiety   . Depression   . Hypertension   . Hypertension   . Migraine   . Migraines   . Movement disorder   . Seizures ([place])    last seizure in 2009    Past Surgical History:  Procedure Laterality Date  . BUNIONECTOMY Right 07/23/2016  . BUNIONECTOMY Left 02/2016  . FOOT SURGERY      Family History  Problem Relation Age of Onset  . Seizures Mother   . Depression Mother   . ADD / ADHD Brother   . Depression Brother   . Migraines Maternal Grandmother   . Depression Maternal Grandmother   . Migraines Maternal Aunt   . Depression Maternal Aunt   . Migraines Maternal Uncle   . ADD / ADHD Cousin        Many Maternal 1st Cousins have [facility]    Social History   Tobacco Use  . Smoking status: Passive Smoke Exposure - Never Smoker  . Smokeless tobacco: Never Used  . Tobacco comment: Parents smoke outside  Vaping Use  . Vaping Use: Never used  Substance Use Topics  . Alcohol use: No    Alcohol/week: 0.0 standard drinks  . Drug  use: No    Allergies:  Allergies  Allergen Reactions  . Morphine And Related Shortness Of Breath and Other (See Comments)    Headache and "trouble breathing"  . Ondansetron Hcl Rash  . Zofran Rash  . Ketorolac Rash    Medications Prior to Admission  Medication Sig Dispense Refill Last Dose  . citalopram (CELEXA) 20 MG tablet Take 1 tablet (20 mg total) by mouth daily. 30 tablet 12 02/28/2020 at Unknown time  . Prenatal Vit-Fe Fumarate-FA (PRENATAL VITAMINS PO) Take by mouth.   02/28/2020 at Unknown time    Review of Systems  Eyes: Negative for photophobia.  Gastrointestinal: Positive for abdominal pain.  Genitourinary: Positive for pelvic pain.  Musculoskeletal: Positive for back pain.  Neurological: Negative for headaches.  All other systems reviewed and are negative.  Physical Exam   Blood pressure 131/82, pulse 97, temperature 98.3 F (36.8 C), temperature source Oral, resp. rate 17, height 5\' 7"  (1.702 m), weight 88.9 kg, last menstrual period 04/24/2019, SpO2 97 %.  Physical Exam Vitals and nursing note reviewed. Exam conducted with a chaperone present.  Constitutional:      Appearance: She is well-developed.  Abdominal:     Tenderness: There is no abdominal tenderness. There is no right CVA tenderness or left CVA tenderness.     Comments: Gravid  Skin:    General: Skin is warm and dry.     Capillary Refill: Capillary refill takes less than 2 seconds.  Neurological:     Mental Status: She is alert.     MAU Course/MDM  Procedures  --Elevated BP on arrival to MAU --Elevated BP in MFM at 31 weeks on 01/05/2020 --Now meets criteria for Gestational Hypertension. PEC labs collected --No severe features or severe range BPs --Reactive tracing: baseline 145, mod var, + 15 x 15 accels, no decels --Toco: Mild ctx q 2-4 min  Assessment and Plan  --19 y.o. G1P0 at [redacted]w[redacted]d  --GHTN --PEC labs in work --Reactive tracing --Favorable cervix --Admit to L&D, report called to  Candie Chroman, [place], [place] 02/29/2020, 9:35 AM

## 2020-02-29 NOTE — Anesthesia Preprocedure Evaluation (Signed)
Anesthesia Evaluation  Patient identified by MRN, date of birth, ID band Patient awake    Airway Mallampati: II  TM Distance: >3 FB Neck ROM: Full    Dental no notable dental hx.    Pulmonary neg pulmonary ROS,    Pulmonary exam normal breath sounds clear to auscultation       Cardiovascular hypertension, Normal cardiovascular exam Rhythm:Regular Rate:Normal     Neuro/Psych  Headaches, Seizures -,  PSYCHIATRIC DISORDERS Anxiety Depression    GI/Hepatic   Endo/Other    Renal/GU Renal disease (kidney stones)     Musculoskeletal   Abdominal   Peds  Hematology   Anesthesia Other Findings   Reproductive/Obstetrics (+) Pregnancy                             Anesthesia Physical Anesthesia Plan  ASA: II  Anesthesia Plan: Epidural   Post-op Pain Management:    Induction:   PONV Risk Score and Plan:   Airway Management Planned:   Additional Equipment:   Intra-op Plan:   Post-operative Plan: Extubation in OR  Informed Consent: I have reviewed the patients History and Physical, chart, labs and discussed the procedure including the risks, benefits and alternatives for the proposed anesthesia with the patient or authorized representative who has indicated his/her understanding and acceptance.       Plan Discussed with: CRNA  Anesthesia Plan Comments:         Anesthesia Quick Evaluation

## 2020-02-29 NOTE — H&P (Signed)
OBSTETRIC ADMISSION HISTORY AND PHYSICAL  Stephanie Frazier is a 19 y.o. female G1P0 with IUP at [redacted]w[redacted]d presenting for cramping and found to be hypertensive on intake. She reports +FMs. No LOF, VB, blurry vision, headaches, peripheral edema, or RUQ pain. She plans on breastfeeding. She requests no birth control.  Dating: By 31w [place] --->  Estimated Date of Delivery: 03/04/20  Sono:    @[redacted]w[redacted]d , normal anatomy, ceph presentation, 2946g, 74%ile, EFW 6'8  Prenatal History/Complications: - teen pregnancy - gHTN - late prenatal care - hx of depression  Past Medical History: Past Medical History:  Diagnosis Date  . Allergy   . Anxiety   . Depression   . Hypertension   . Hypertension   . Migraine   . Migraines   . Movement disorder   . Seizures ([place])    last seizure in 2009    Past Surgical History: Past Surgical History:  Procedure Laterality Date  . BUNIONECTOMY Right 07/23/2016  . BUNIONECTOMY Left 02/2016  . FOOT SURGERY      Obstetrical History: OB History    Gravida  1   Para      Term      Preterm      AB      Living  0     SAB      TAB      Ectopic      Multiple      Live Births              Social History: Social History   Socioeconomic History  . Marital status: Single    Spouse name: Not on file  . Number of children: Not on file  . Years of education: Not on file  . Highest education level: Not on file  Occupational History  . Not on file  Tobacco Use  . Smoking status: Passive Smoke Exposure - Never Smoker  . Smokeless tobacco: Never Used  . Tobacco comment: Parents smoke outside  Vaping Use  . Vaping Use: Never used  Substance and Sexual Activity  . Alcohol use: No    Alcohol/week: 0.0 standard drinks  . Drug use: No  . Sexual activity: Yes  Other Topics Concern  . Not on file  Social History Narrative   [name] is a 12th grade student.   She attends [employer]    She lives with her mother and step-father.   She  enjoys art and boxing.   Social Determinants of Health   Financial Resource Strain:   . Difficulty of Paying Living Expenses:   Food Insecurity:   . Worried About Charity fundraiser in the Last Year:   . Arboriculturist in the Last Year:   Transportation Needs:   . Film/video editor (Medical):   Marland Kitchen Lack of Transportation (Non-Medical):   Physical Activity:   . Days of Exercise per Week:   . Minutes of Exercise per Session:   Stress:   . Feeling of Stress :   Social Connections:   . Frequency of Communication with Friends and Family:   . Frequency of Social Gatherings with Friends and Family:   . Attends Religious Services:   . Active Member of Clubs or Organizations:   . Attends Archivist Meetings:   Marland Kitchen Marital Status:     Family History: Family History  Problem Relation Age of Onset  . Seizures Mother   . Depression Mother   . ADD / ADHD  Brother   . Depression Brother   . Migraines Maternal Grandmother   . Depression Maternal Grandmother   . Migraines Maternal Aunt   . Depression Maternal Aunt   . Migraines Maternal Uncle   . ADD / ADHD Cousin        Many Maternal 1st Cousins have [place]    Allergies: Allergies  Allergen Reactions  . Morphine And Related Shortness Of Breath and Other (See Comments)    Headache and "trouble breathing"  . Ondansetron Hcl Rash  . Zofran Rash  . Ketorolac Rash    Medications Prior to Admission  Medication Sig Dispense Refill Last Dose  . citalopram (CELEXA) 20 MG tablet Take 1 tablet (20 mg total) by mouth daily. 30 tablet 12 02/28/2020 at Unknown time  . Prenatal Vit-Fe Fumarate-FA (PRENATAL VITAMINS PO) Take by mouth.   02/28/2020 at Unknown time     Review of Systems:  All systems reviewed and negative except as stated in HPI  PE: Blood pressure 126/85, pulse 88, temperature 98.3 F (36.8 C), temperature source Oral, resp. rate 17, height 5\' 7"  (1.702 m), weight 88.9 kg, last menstrual period 04/24/2019, SpO2  98 %. General appearance: alert, cooperative and appears stated age Lungs: regular rate and effort Heart: regular rate  Abdomen: soft, non-tender Extremities: Homans sign is negative, no sign of DVT Presentation: cephalic EFM: 086 bpm, moderate variability, + accels, no decels Toco: irregular Dilation: 1.5 Effacement (%): 50 Station: -3 Exam by:: Maryelizabeth Kaufmann, CNM  Prenatal labs: ABO, Rh: --/--/PENDING (08/10 7619) Antibody: PENDING (08/10 0928) Rubella: 1.85 (05/28 0939) RPR: Non Reactive (05/28 0939)  HBsAg: Negative (05/28 0939)  HIV: Non Reactive (05/28 0939)  GBS: Negative/-- (07/20 1053)  2 hr GTT normal  Prenatal Transfer Tool  Maternal Diabetes: No Genetic Screening: Normal Maternal Ultrasounds/Referrals: Normal Fetal Ultrasounds or other Referrals:  None Maternal Substance Abuse:  No Significant Maternal Medications:  None Significant Maternal Lab Results: Group B Strep negative  Results for orders placed or performed during the hospital encounter of 02/29/20 (from the past 24 hour(s))  Urinalysis, Routine w reflex microscopic Urine, Clean Catch   Collection Time: 02/29/20  8:36 AM  Result Value Ref Range   Color, Urine YELLOW YELLOW   APPearance HAZY (A) CLEAR   Specific Gravity, Urine 1.018 1.005 - 1.030   pH 6.0 5.0 - 8.0   Glucose, UA NEGATIVE NEGATIVE mg/dL   Hgb urine dipstick NEGATIVE NEGATIVE   Bilirubin Urine NEGATIVE NEGATIVE   Ketones, ur NEGATIVE NEGATIVE mg/dL   Protein, ur NEGATIVE NEGATIVE mg/dL   Nitrite NEGATIVE NEGATIVE   Leukocytes,Ua TRACE (A) NEGATIVE   RBC / HPF 0-5 0 - 5 RBC/hpf   WBC, UA 6-10 0 - 5 WBC/hpf   Bacteria, UA RARE (A) NONE SEEN   Squamous Epithelial / LPF 0-5 0 - 5   Mucus PRESENT    Amorphous Crystal PRESENT   Protein / creatinine ratio, urine   Collection Time: 02/29/20  8:36 AM  Result Value Ref Range   Creatinine, Urine 84.44 mg/dL   Total Protein, Urine 22 mg/dL   Protein Creatinine Ratio 0.26 (H) 0.00 - 0.15  mg/mg[Cre]  Type and screen   Collection Time: 02/29/20  9:28 AM  Result Value Ref Range   ABO/RH(D) PENDING    Antibody Screen PENDING    Sample Expiration      03/03/2020,2359 Performed at [facility], [address], [place], [place] [address]     Patient Active Problem  List   Diagnosis Date Noted  . Supervision of normal first pregnancy, antepartum 12/17/2019  . Late prenatal care affecting pregnancy in third trimester 12/17/2019  . Severe recurrent major depression with psychotic features ([place]) 08/02/2017  . Nephrolithiasis   . Flank pain, acute 02/12/2017  . Migraine without aura and with status migrainosus, not intractable 07/20/2015  . Episodic tension-type headache, not intractable 07/20/2015  . Mood disorder ([place]) 07/20/2015  . Insomnia 07/20/2015  . ADHD (attention deficit hyperactivity disorder) 04/09/2013    Assessment: SHAROL CROGHAN is a 19 y.o. G1P0 at [redacted]w[redacted]d here for gHTN  1. Labor: none 2. FWB: Cat I 3. Pain: analgesia/anesthesia prn 4. GBS: neg   Plan: Admit to LD PEC labs normal except P:cr=0.26 Induction to begin with FB placement and buccal cytotec, will progress to Pitocin titration and possible AROM Anticipate NSVD  Gaylan Gerold, CNM, MSN, [facility] 02/29/20 11:41 AM

## 2020-02-29 NOTE — Anesthesia Procedure Notes (Signed)
Epidural Patient location during procedure: floor Start time: 02/29/2020 3:55 PM End time: 02/29/2020 4:05 PM  Staffing Anesthesiologist: Merlinda Frederick, MD Performed: anesthesiologist   Preanesthetic Checklist Completed: patient identified, IV checked, site marked, risks and benefits discussed, surgical consent, monitors and equipment checked, pre-op evaluation and timeout performed  Epidural Patient position: sitting Prep: ChloraPrep Patient monitoring: heart rate, continuous pulse ox and blood pressure Approach: midline Location: L2-L3 Injection technique: LOR saline  Needle:  Needle type: Tuohy  Needle gauge: 17 G Needle length: 9 cm Needle insertion depth: 5 and 5 cm Catheter type: closed end flexible Catheter size: 20 Guage Catheter at skin depth: 10 cm Test dose: negative

## 2020-02-29 NOTE — Progress Notes (Signed)
Labor Progress Note Stephanie Frazier is a 19 y.o. G1P0 at [redacted]w[redacted]d presented for IOL for new onset gestational hypertension S: Resting well with epidural in place  O:  BP 126/76   Pulse 86   Temp 98.1 F (36.7 C) (Oral)   Resp 16   Ht 5\' 7"  (1.702 m)   Wt 195 lb 14.4 oz (88.9 kg)   LMP 04/24/2019   SpO2 98%   BMI 30.68 kg/m   Fetal Tracing: reassuring Baseline: 135 Variability: moderate Accelerations: present Decelerations: deep early decels starting 5-59min after AROM Toco: q2-83min prior to AROM, then q1-12min  CVE: Dilation: 7 Effacement (%): 80 Cervical Position: Middle Station: -1 Presentation: Vertex Exam by:: Erikah Thumm, cnm  AROM done at 1809, mom and baby tolerated it well but then baby began having deep early decels with good recovery and variability. Switched positions x3, then placed IUPC and began amnioinfusion. Decels continued, so decreased Pitocin by half, then off completely. Baby had a prolonged decel shortly after stopping Pitocin, MD called to bedside. Terbutaline given and FHR stabilized as contractions eased to q52min with decreased intensity. Will continue Pitocin break for 30min and restart.  A&P: 19 y.o. G1P0 [redacted]w[redacted]d  #Labor: Progressing well. Will restart Pitocin titration in 90min #Pain: Controlled well with epidural, FOB at bedside  #FWB: Now Cat 1 #GBS negative  Gabriel Carina, CNM 7:09 PM

## 2020-02-29 NOTE — MAU Note (Signed)
Been experiencing constant tightening in upper abd, not painful.  Started early this morning.  Has had pain in her hips some time. Random pains elsewhere. Denies bleeding or LOF.

## 2020-02-29 NOTE — Progress Notes (Signed)
Labor Progress Note Stephanie Frazier is a 18 y.o. G1P0 at [redacted]w[redacted]d who presented for  IOL for new onset gestational hypertension.  S: Resting in bed, epidural in place, no complaints.  O:  BP 116/71   Pulse 81   Temp 99.3 F (37.4 C) (Oral)   Resp 16   Ht 5\' 7"  (1.702 m)   Wt 88.9 kg   LMP 04/24/2019   SpO2 100%   BMI 30.68 kg/m  EFM: 135/mod variability/+ accels/single early decel x 1 min relieved by position change  CVE: Dilation: 7 Effacement (%): 80 Cervical Position: Middle Station: -1 Presentation: Vertex Exam by:: walker, cnm   A&P: 19 y.o. G1P0 [redacted]w[redacted]d presenting for  IOL for new onset gestational hypertension.  #Labor: Progressing well. Pitocin on 4. Single early decel relieved by position change, currently in high-fowlers.3 #Pain: Epidural #FWB: Now Cat I #GBS negative #gHTN: Single mild range pressure 129/91 at 2001, normal on recheck.  Maureen Ralphs, MD 11:14 PM

## 2020-03-01 ENCOUNTER — Encounter (HOSPITAL_COMMUNITY): Payer: Self-pay | Admitting: Family Medicine

## 2020-03-01 LAB — CBC
HCT: 29.9 % — ABNORMAL LOW (ref 36.0–46.0)
Hemoglobin: 10 g/dL — ABNORMAL LOW (ref 12.0–15.0)
MCH: 31.3 pg (ref 26.0–34.0)
MCHC: 33.4 g/dL (ref 30.0–36.0)
MCV: 93.7 fL (ref 80.0–100.0)
Platelets: 160 10*3/uL (ref 150–400)
RBC: 3.19 MIL/uL — ABNORMAL LOW (ref 3.87–5.11)
RDW: 13.4 % (ref 11.5–15.5)
WBC: 12.9 10*3/uL — ABNORMAL HIGH (ref 4.0–10.5)
nRBC: 0 % (ref 0.0–0.2)

## 2020-03-01 MED ORDER — NIFEDIPINE ER OSMOTIC RELEASE 30 MG PO TB24
30.0000 mg | ORAL_TABLET | Freq: Every day | ORAL | Status: DC
  Administered 2020-03-02: 30 mg via ORAL
  Filled 2020-03-01: qty 1

## 2020-03-01 MED ORDER — WITCH HAZEL-GLYCERIN EX PADS: 1.0000 "application " | MEDICATED_PAD | CUTANEOUS | Status: DC | PRN

## 2020-03-01 MED ORDER — COCONUT OIL OIL: 1.0000 "application " | TOPICAL_OIL | Status: DC | PRN

## 2020-03-01 MED ORDER — IBUPROFEN 600 MG PO TABS
600.0000 mg | ORAL_TABLET | Freq: Four times a day (QID) | ORAL | Status: DC | PRN
  Administered 2020-03-01 – 2020-03-02 (×4): 600 mg via ORAL
  Filled 2020-03-01 (×4): qty 1

## 2020-03-01 MED ORDER — CITALOPRAM HYDROBROMIDE 20 MG PO TABS
20.0000 mg | ORAL_TABLET | Freq: Every day | ORAL | Status: DC
  Administered 2020-03-01: 20 mg via ORAL
  Filled 2020-03-01: qty 1

## 2020-03-01 MED ORDER — BENZOCAINE-MENTHOL 20-0.5 % EX AERO
1.0000 "application " | INHALATION_SPRAY | CUTANEOUS | Status: DC | PRN
  Administered 2020-03-01: 1 via TOPICAL
  Filled 2020-03-01: qty 56

## 2020-03-01 MED ORDER — ACETAMINOPHEN 325 MG PO TABS
650.0000 mg | ORAL_TABLET | ORAL | Status: DC | PRN
  Administered 2020-03-01 (×4): 650 mg via ORAL
  Filled 2020-03-01 (×4): qty 2

## 2020-03-01 MED ORDER — TETANUS-DIPHTH-ACELL PERTUSSIS 5-2.5-18.5 LF-MCG/0.5 IM SUSP: 0.5000 mL | Freq: Once | INTRAMUSCULAR | Status: DC

## 2020-03-01 MED ORDER — DIBUCAINE (PERIANAL) 1 % EX OINT: 1.0000 "application " | TOPICAL_OINTMENT | CUTANEOUS | Status: DC | PRN

## 2020-03-01 MED ORDER — NIFEDIPINE 10 MG PO CAPS
10.0000 mg | ORAL_CAPSULE | Freq: Every day | ORAL | Status: DC
  Administered 2020-03-01: 10 mg via ORAL
  Filled 2020-03-01: qty 1

## 2020-03-01 MED ORDER — CITALOPRAM HYDROBROMIDE 20 MG PO TABS
20.0000 mg | ORAL_TABLET | Freq: Every day | ORAL | Status: DC
  Filled 2020-03-01: qty 1

## 2020-03-01 MED ORDER — SENNOSIDES-DOCUSATE SODIUM 8.6-50 MG PO TABS
2.0000 | ORAL_TABLET | ORAL | Status: DC
  Administered 2020-03-01: 2 via ORAL
  Filled 2020-03-01: qty 2

## 2020-03-01 MED ORDER — SIMETHICONE 80 MG PO CHEW: 80.0000 mg | CHEWABLE_TABLET | ORAL | Status: DC | PRN

## 2020-03-01 MED ORDER — PRENATAL MULTIVITAMIN CH
1.0000 | ORAL_TABLET | Freq: Every day | ORAL | Status: DC
  Administered 2020-03-01 – 2020-03-02 (×2): 1 via ORAL
  Filled 2020-03-01 (×2): qty 1

## 2020-03-01 MED ORDER — DIPHENHYDRAMINE HCL 25 MG PO CAPS: 25.0000 mg | ORAL_CAPSULE | Freq: Four times a day (QID) | ORAL | Status: DC | PRN

## 2020-03-01 NOTE — Discharge Summary (Signed)
Postpartum Discharge Summary      Patient Name: Stephanie Frazier DOB: 03/14/2001 MRN: [id number]  Date of admission: 02/29/2020 Delivery date:02/29/2020  Delivering provider: Wende Mott A  Date of discharge: 03/02/2020  Admitting diagnosis: Gestational hypertension [O13.9] Intrauterine pregnancy: [redacted]w[redacted]d    Secondary diagnosis:  Active Problems:   Gestational hypertension   SVD (spontaneous vaginal delivery)  Additional problems: History of depression, late prenatal care     Discharge diagnosis: Term Pregnancy Delivered and Gestational Hypertension     Post partum procedures:none Augmentation: AROM, Pitocin, Cytotec and IP Foley Complications: None  Hospital course: Induction of Labor With Vaginal Delivery   19y.o. yo G1P0 at 339w4das admitted to the hospital 02/29/2020 for induction of labor.  Indication for induction: Gestational hypertension.  Patient had an uncomplicated labor course as follows: Membrane Rupture Time/Date: 6:09 PM ,02/29/2020   Delivery Method:Vaginal, Spontaneous  Episiotomy: None  Lacerations:  Labial  Details of delivery can be found in separate delivery note.  Patient had a routine postpartum course. Patient is discharged home 03/02/20.  Newborn Data: Birth date:02/29/2020  Birth time:11:50 PM  Gender:Female  Living status:Living  Apgars:7 ,9  Weight:3250 g   Magnesium Sulfate received: No BMZ received: No Rhophylac:N/A MMR:N/A T-DaP:Given prenatally Flu: N/A Transfusion:No  Physical exam  Vitals:   03/01/20 0823 03/01/20 1220 03/01/20 2143 03/02/20 0616  BP: 129/77 123/75 128/75 127/87  Pulse: 69 72 84 76  Resp: _0 Temp: 98.4 F (36.9 C) 98 F (36.7 C) 97.8 F (36.6 C) 98 F (36.7 C)  TempSrc: Oral Oral Oral Oral  SpO2: 99% 100% 99% 100%  Weight:      Height:       General: alert, cooperative and no distress Lochia: appropriate Uterine Fundus: firm Incision: N/A DVT Evaluation: No evidence of DVT seen on  physical exam. Labs: Lab Results  Component Value Date   WBC 12.9 (H) 03/01/2020   HGB 10.0 (L) 03/01/2020   HCT 29.9 (L) 03/01/2020   MCV 93.7 03/01/2020   PLT 160 03/01/2020   CMP Latest Ref Rng & Units 02/29/2020  Glucose 70 - 99 mg/dL 99  BUN 6 - 20 mg/dL 12  Creatinine 0.44 - 1.00 mg/dL 0.57  Sodium 135 - 145 mmol/L 136  Potassium 3.5 - 5.1 mmol/L 4.1  Chloride 98 - 111 mmol/L 105  CO2 22 - 32 mmol/L 21(L)  Calcium 8.9 - 10.3 mg/dL 9.0  Total Protein 6.5 - 8.1 g/dL 6.3(L)  Total Bilirubin 0.3 - 1.2 mg/dL <0.1(L)  Alkaline Phos 38 - 126 U/L 100  AST 15 - 41 U/L 20  ALT 0 - 44 U/L 15   Edinburgh Score: Edinburgh Postnatal Depression Scale Screening Tool 03/02/2020  I have been able to laugh and see the funny side of things. (No Data)     After visit meds:  Allergies as of 03/02/2020      Reactions   Morphine And Related Shortness Of Breath, Other (See Comments)   Headache and "trouble breathing"   Ondansetron Hcl Rash   Zofran Rash   Ketorolac Rash      Medication List    TAKE these medications   acetaminophen 325 MG tablet Commonly known as: Tylenol Take 2 tablets (650 mg total) by mouth every 4 (four) hours as needed (for pain scale < 4).   citalopram 20 MG tablet Commonly known as: CeleXA Take 1 tablet (20 mg total) by mouth daily.   ibuprofen  600 MG tablet Commonly known as: ADVIL Take 1 tablet (600 mg total) by mouth every 6 (six) hours as needed for fever or headache.   NIFEdipine 30 MG 24 hr tablet Commonly known as: ADALAT CC Take 1 tablet (30 mg total) by mouth daily.        Discharge home in stable condition Infant Feeding: Breast Infant Disposition:home with mother Discharge instruction: per After Visit Summary and Postpartum booklet. Activity: Advance as tolerated. Pelvic rest for 6 weeks.  Diet: routine diet Future Appointments: Future Appointments  Date Time Provider [place]  03/10/2020  9:30 AM [facility] NURSE [facility]  None  04/03/2020  9:30 AM Truett Mainland, DO [facility] None   Follow up Visit:   Please schedule this patient for a In person postpartum visit in 4 weeks with the following provider: Any provider. Additional Postpartum F/U:BP check 1 week  High risk pregnancy complicated by: HTN Delivery mode:  Vaginal, Spontaneous  Anticipated Birth Control:  Unsure   03/02/2020 Anairis Knick L Punam Broussard, DO

## 2020-03-01 NOTE — Clinical Social Work Maternal (Signed)
CLINICAL SOCIAL WORK MATERNAL/CHILD NOTE  Patient Details  Name: Stephanie Frazier MRN: [id number] Date of Birth: 12/24/2000  Date:  30-Nov-2019  Clinical Social Worker Initiating Note:  Durward Fortes, LCSW Date/Time: Initiated:  03/01/20/1000     Child's Name:  Stephanie Frazier   Biological Parents:  Mother, Father Stephanie Frazier)   Need for Interpreter:  None   Reason for Referral:  Behavioral Health Concerns, Late or No Prenatal Care    Address:  [address] [place] [address]    Phone number:  [phone] (home)     Additional phone number: none   Household Members/Support Persons (HM/SP):   Household Member/Support Person 1   HM/SP Name Relationship DOB or Age  HM/SP -1  Stephanie Frazier   MOB   02/07/2001  HM/SP -2  Stephanie Frazier   FOB   02/24/2001  HM/SP -3        HM/SP -4        HM/SP -5        HM/SP -6        HM/SP -7        HM/SP -8          Natural Supports (not living in the home):  Parent   Professional Supports: None   Employment: Unemployed   Type of Work: none   Education:  High school graduate   Homebound arranged:  n/a  Museum/gallery curator Resources:  Medicaid   Other Resources:  Physicist, medical , [place] (plans to apply for food stamps.)   Cultural/Religious Considerations Which May Impact Care:  none   Strengths:  Ability to meet basic needs , Compliance with medical plan , Home prepared for child , Pediatrician chosen   Psychotropic Medications:      None reported.    Pediatrician:    [place] area  Pediatrician List:   [facility]      Pediatrician Fax Number:    Risk Factors/Current Problems:  None   Cognitive State:  Insightful , Able to Concentrate , Alert    Mood/Affect:  Calm , Bright , Relaxed , Comfortable , Interested , Happy    CSW Assessment: CSW consulted as MOB  has a hx of anxiety , depression and LPNC starting at 28 weeks. CSW met with MOB at bedside to address further needs.   CSW congratulated MOB On the birth of infant. CSW advised MOB of CSW's role and the reason for CSW coming to visit with her. MOB reported that she was diagnosed with anxiety/depression in 2019 "after my 4th suicide attempt". MOB reported that she was dealing with a lot of trauma from her childhood during that time. MOB reported that she  was admitted to [facility] for a week and then was released. MOB reported that she was never place don any medications or in therapy. MOB expressed no desire for medications at this time and declined therapy resources also. MOB reported that she now has a great support from her mom and fiance. MOB reported that she hasn't felt SI or HI and denies DV. When asked about other mental health hx MOB declined having any other diagnosis.   CSW inquired from [facility] on her reason for [facility]. MOB reported that  she was unable to get Medicaid therefore wasn't abel to be seen for care. CSW understanding and  advised MOB of the hospital drug screen policy. MOB was advised that if infants UDS or CDS returns positive then CSW would need to make a CPS report. MOB reported that she understood and denied substance use. MOB reported that she gets WIC and has plans to apply for Food Stamps. MOB reported that she has all the essential items to care for infant. MOB was given PPD and SIDS education in which MOB reports that infant will sleep in crib once arrived home. MOB was given PPD Checklist to keep track of her feelings as they relate to PPD.   CSW will continue to monitor infants CDS and UDS and make CPS report if warranted. No barriers to d/c.   CSW Plan/Description:  No Further Intervention Required/No Barriers to Discharge, Sudden Infant Death Syndrome (SIDS) Education, Perinatal Mood and Anxiety Disorder (PMADs) Education, Hospital Drug Screen Policy Information, CSW Will Continue to  Monitor Umbilical Cord Tissue Drug Screen Results and Make Report if Warranted    Jimi Schappert S Danett Palazzo, LCSWA 03/01/2020, 10:47 AM 

## 2020-03-01 NOTE — Anesthesia Postprocedure Evaluation (Signed)
Anesthesia Post Note  Patient: Stephanie Frazier  Procedure(s) Performed: AN AD HOC LABOR EPIDURAL     Patient location during evaluation: Mother Baby Anesthesia Type: Epidural Level of consciousness: awake and alert Pain management: pain level controlled Vital Signs Assessment: post-procedure vital signs reviewed and stable Respiratory status: spontaneous breathing, nonlabored ventilation and respiratory function stable Cardiovascular status: stable Postop Assessment: no headache, no backache and epidural receding Anesthetic complications: no   No complications documented.  Last Vitals:  Vitals:   03/01/20 0330 03/01/20 0427  BP: 138/83 138/78  Pulse: 80 78  Resp: 16 16  Temp: 36.9 C 36.9 C  SpO2: 100% 100%    Last Pain:  Vitals:   03/01/20 0500  TempSrc:   PainSc: 5    Pain Goal:                   Rayvon Char

## 2020-03-01 NOTE — Progress Notes (Signed)
POSTPARTUM PROGRESS NOTE  Subjective: Stephanie Frazier is a 19 y.o. G1P1001 PPD#1 s/p SVD at [redacted]w[redacted]d.  She reports she doing well. Overnight had one presyncopal episode just after arriving on the postpartum floor after delivery, no additional overnight events reported. She denies any problems with ambulating or voiding, has not yet attempted po intake. Denies nausea or vomiting. She has passed flatus. Pain is well controlled.  Lochia is less than menses.  Objective: Blood pressure 138/78, pulse 78, temperature 98.4 F (36.9 C), temperature source Oral, resp. rate 16, height 5\' 7"  (1.702 m), weight 88.9 kg, last menstrual period 04/24/2019, SpO2 100 %, unknown if currently breastfeeding.  Physical Exam:  General: alert, cooperative and no distress Chest: no respiratory distress Abdomen: soft, non-tender  Uterine Fundus: firm, appropriately tender Extremities: No calf swelling or tenderness  no edema  Recent Labs    02/29/20 1429 03/01/20 0230  HGB 11.5* 10.0*  HCT 35.3* 29.9*    Assessment/Plan: Stephanie Frazier is a 79 y.o. G1P1001 PPD#1 s/p SVD at [redacted]w[redacted]d for IOL for new-onset gHTN.  Routine Postpartum Care: Doing well, pain well-controlled.  -- Continue routine care, lactation support  -- Contraception: Unsure -- Feeding: Breast  GHTN: Blood pressures in 130s/70s range. Asymptomatic. Will start Procardia 10mg  daily.  Anxiety, Depression: Mood good this morning. Continue home Celexa 20mg  daily. Pending SW consult.   Dispo: Plan for discharge tomorrow pending normal postpartum course.  Wende Mott, MD PGY-3 Family Medicine Resident

## 2020-03-01 NOTE — Lactation Note (Signed)
This note was copied from a baby's chart. Lactation Consultation Note  Patient Name: Stephanie Frazier [id number] Date: 03/01/2020 Reason for consult: Initial assessment;1st time breastfeeding;Primapara;Term  Visited with mom of a 36 hours old FT female, she's a P1. Mom had late [facility], RN placing cotton balls on baby's diaper to do UDS. Mom reported (+) breast changes during the pregnancy. She participated in the [facility] program at the [facility]; but she wasn't familiar with hand expression. [facility] taught mom how to hand express and she was able to get small droplets of colostrum on her own, praised her for her efforts.   Noticed that mom has semi-flat nipples but her tissue is very compressible. [place] set her up with a hand pump, instructions, cleaning and storage were reviewed. Per provider's note mom is on Celexa, an [place] working with mom and baby when entering the room, she reported to [facility] that baby has been very spitty. Baby still spitting up at the time of [facility] consultation, she wasn't cueing or ready to eat yet. Asked mom to call for assistance when needed, she told LC that baby hasn't been able to latch like she should because she's been spitty. Reviewed normal newborn behavior, cluster feeding, feeding cues, size of baby's stomach and lactogenesis II.  Feeding plan:  1. Encouraged mom to feed baby STS 8-12 times/24 hours or sooner if feeding cues are present 2. Hand expression and spoon feeding were also encouraged, especially if baby is not latching on yet  BF brochure, BF resources and feeding diary were reviewed. GOB present in the room at the time of [facility] consultation. Family reported all questions and concerns were answered, they're both aware of [place] OP services and will call PRN.  Maternal Data Formula Feeding for Exclusion: No Has patient been taught Hand Expression?: Yes Does the patient have breastfeeding experience prior to this delivery?: No  Feeding Feeding Type: Breast Fed  LATCH  Score                   Interventions Interventions: Breast feeding basics reviewed;Breast massage;Hand express;Breast compression;Hand pump  Lactation Tools Discussed/Used Tools: Pump Breast pump type: Manual WIC Program: Yes Pump Review: Setup, frequency, and cleaning Initiated by:: MPeck Date initiated:: 03/01/20   Consult Status Consult Status: Follow-up Date: 03/02/20 Follow-up type: In-patient    Stephanie Frazier 03/01/2020, 1:59 PM

## 2020-03-02 MED ORDER — IBUPROFEN 600 MG PO TABS: 600.0000 mg | ORAL_TABLET | Freq: Four times a day (QID) | ORAL | 0 refills | Status: DC | PRN

## 2020-03-02 MED ORDER — NIFEDIPINE ER 30 MG PO TB24: 30.0000 mg | ORAL_TABLET | Freq: Every day | ORAL | 1 refills | Status: DC

## 2020-03-02 MED ORDER — ACETAMINOPHEN 325 MG PO TABS: 650.0000 mg | ORAL_TABLET | ORAL | 0 refills | Status: DC | PRN

## 2020-03-02 MED FILL — ACETAMINOPHEN 325 MG TABS: 325 | 2 days supply | Qty: 30 | Fill #0

## 2020-03-02 MED FILL — IBUPROFEN 600 MG TABLET: 600 | 7 days supply | Qty: 30 | Fill #0

## 2020-03-02 MED FILL — NIFEdipine ER 30 MG TB24: 30 | 30 days supply | Qty: 30 | Fill #0

## 2020-03-02 NOTE — Lactation Note (Signed)
This note was copied from a baby's chart. Lactation Consultation Note  Patient Name: Stephanie Frazier Date: 03/02/2020 Reason for consult: Follow-up assessment   Lc arrived in mothers room and infant was asleep in crib. Infant has not eaten since 7;30 this am. Mother reports that she has attempt but infant was  Not interested.   Assist mother with placing infant skin to skin in cross cradle hold.mother hand expressed colostrum and LC took the hands off approach and mother latched infant independently.   Infant sustained latch for 20-25 mins with observed audible swallows. Infant was spoon fed 2 ml of ebm after breastfeeding. Mother place infant back on breast in football hold and infant fed for only a few mins.   Lots of teaching with mother on feeding infant and placing infant STS when she has not eaten in a while.  Suggested that she hand express or use hand pump and supplement infant after each feeding until infant begins to feed more frequently and show feeding cues.  She is active with WIC .mother is [place] wic Mother to continue to cue base feed infant and feed at least 8-12 times or more in 24 hours and advised to allow for cluster feeding infant as needed.  Mother to continue to due STS. Mother is aware of available LC services at [facility], BFSG'S, OP Dept, and phone # for questions or concerns about breastfeeding.  Mother receptive to all teaching and plan of care.   Maternal Data    Feeding Feeding Type: Breast Fed  LATCH Score                   Interventions    Lactation Tools Discussed/Used     Consult Status      Darla Lesches 03/02/2020, 12:25 PM

## 2020-03-03 ENCOUNTER — Ambulatory Visit: Payer: Medicaid Other

## 2020-03-03 ENCOUNTER — Other Ambulatory Visit: Payer: Self-pay

## 2020-03-03 ENCOUNTER — Encounter: Payer: Medicaid Other | Admitting: Obstetrics & Gynecology

## 2020-03-03 VITALS — BP 128/71 | HR 114 | Wt 187.0 lb

## 2020-03-03 DIAGNOSIS — Z0130 Encounter for examination of blood pressure without abnormal findings: Secondary | ICD-10-CM

## 2020-03-03 NOTE — Progress Notes (Addendum)
Subjective:  Stephanie Frazier is a 19 y.o. female here for BP check.   Hypertension ROS: taking medications as instructed, no medication side effects noted, no TIA's, no chest pain on exertion, no dyspnea on exertion and no swelling of ankles.    Objective:  BP 128/71   Pulse (!) 114   Wt 187 lb (84.8 kg)   LMP 04/24/2019   BMI 29.29 kg/m   Appearance alert, well appearing, and in no distress. General exam BP noted to be well controlled today in office.    Assessment:   Blood Pressure well controlled.   Plan:  Current treatment plan is effective, no change in therapy.   Attestation of Attending Supervision of CMA/RN: Evaluation and management procedures were performed by the nurse under my supervision and collaboration.  I have reviewed the nursing note and chart, and I agree with the management and plan.  Carolyn L. Harraway-Smith, M.D., Stephanie Frazier

## 2020-04-03 ENCOUNTER — Other Ambulatory Visit: Payer: Self-pay

## 2020-04-03 ENCOUNTER — Ambulatory Visit (INDEPENDENT_AMBULATORY_CARE_PROVIDER_SITE_OTHER): Payer: Medicaid Other | Admitting: Family Medicine

## 2020-04-03 ENCOUNTER — Encounter: Payer: Self-pay | Admitting: Family Medicine

## 2020-04-03 NOTE — Progress Notes (Signed)
    [place] Partum Visit Note  Stephanie Frazier is a 19 y.o. G38P1001 female who presents for a postpartum visit. She is 4 weeks postpartum following a normal spontaneous vaginal delivery.  I have fully reviewed the prenatal and intrapartum course. The delivery was at 39.3 gestational weeks.  Anesthesia: epidural. Postpartum course has been uneventful. Baby is doing well. Baby is feeding by breast. Bleeding no bleeding. Bowel function is normal. Bladder function is normal. Patient is not sexually active. Contraception method is condoms. Postpartum depression screening: negative. score5   The pregnancy intention screening data noted above was reviewed. Potential methods of contraception were discussed. The patient elected to proceed with Female Condom.    Edinburgh Postnatal Depression Scale - 04/03/20 0937      Edinburgh Postnatal Depression Scale:  In the Past 7 Days   I have been able to laugh and see the funny side of things. 0    I have looked forward with enjoyment to things. 0    I have blamed myself unnecessarily when things went wrong. 1    I have been anxious or worried for no good reason. 1    I have felt scared or panicky for no good reason. 1    Things have been getting on top of me. 0    I have been so unhappy that I have had difficulty sleeping. 0    I have felt sad or miserable. 1    I have been so unhappy that I have been crying. 1    The thought of harming myself has occurred to me. 0    Edinburgh Postnatal Depression Scale Total 5            The following portions of the patient's history were reviewed and updated as appropriate: allergies, current medications, past family history, past medical history, past social history, past surgical history and problem list.  Review of Systems Pertinent items are noted in HPI.    Objective:  Blood pressure 125/78, pulse 71, height 5\' 7"  (1.702 m), weight 183 lb (83 kg), last menstrual period 04/24/2019, currently  breastfeeding.  General:  alert, cooperative and no distress  Lungs: clear to auscultation bilaterally  Heart:  regular rate and rhythm, S1, S2 normal, no murmur, click, rub or gallop  Abdomen: soft, non-tender; bowel sounds normal; no masses,  no organomegaly        Assessment:    Normal postpartum exam. Pap smear not done at today's visit.   Plan:   Essential components of care per ACOG recommendations:  1.  Mood and well being: Patient with negative depression screening today. Reviewed local resources for support.  - Patient does not use tobacco.   2. Infant care and feeding:  -Patient currently breastmilk feeding? Yes  -Social determinants of health (SDOH) reviewed in EPIC. No concerns  3. Sexuality, contraception and birth spacing - Patient does not want a pregnancy in the next year.  - Patient desired condoms today.   - Discussed birth spacing of 18 months  4. Sleep and fatigue -Encouraged family/partner/community support of 4 hrs of uninterrupted sleep to help with mood and fatigue  5. Physical Recovery  - Discussed patients delivery and complications - Patient has urinary incontinence? No  - Patient is safe to resume physical and sexual activity  6.  Health Maintenance - f/u in 1 year  Truett Mainland, [place] for [place]

## 2020-11-23 ENCOUNTER — Encounter (INDEPENDENT_AMBULATORY_CARE_PROVIDER_SITE_OTHER): Payer: Self-pay

## 2021-07-22 NOTE — L&D Delivery Note (Addendum)
Delivery Note At 2:18 PM a viable female was delivered via Vaginal, Spontaneous (Presentation: Left Occiput Anterior). Single nuchal chord that did not require reduction. APGAR: 8/9 weight  pending.   Placenta status: Spontaneous, Intact.  Cord: 3 vessels with the following complications: None.     Anesthesia: Epidural Episiotomy: None Lacerations:  none  Est. Blood Loss (mL): 150  Mom to postpartum.  Baby to Couplet care / Skin to Skin.  Alain Marion 03/05/2022, 2:36 PM    I was gloved and present for delivery in its entirety. RN called CNM to room for patient feeling pressure and upon assessment, infant was crowning on the perineum. Patient instructed to push x2 with delivery of infant. Nuchal present, delivered through and reduced after delivery.   Complications: nuchal x1  Lacerations: n/a  EBL: 171m  CWende Mott CNM 3:15 PM

## 2021-09-27 ENCOUNTER — Encounter: Payer: Self-pay | Admitting: General Practice

## 2021-09-27 ENCOUNTER — Other Ambulatory Visit (HOSPITAL_COMMUNITY)
Admission: RE | Admit: 2021-09-27 | Discharge: 2021-09-27 | Disposition: A | Discharge status: Home / Self Care | Payer: Medicaid Other | Source: Ambulatory Visit | Attending: Family Medicine | Admitting: Family Medicine

## 2021-09-27 ENCOUNTER — Other Ambulatory Visit: Payer: Self-pay | Admitting: Family Medicine

## 2021-09-27 ENCOUNTER — Other Ambulatory Visit: Payer: Self-pay

## 2021-09-27 ENCOUNTER — Ambulatory Visit (INDEPENDENT_AMBULATORY_CARE_PROVIDER_SITE_OTHER): Payer: Medicaid Other | Admitting: Family Medicine

## 2021-09-27 ENCOUNTER — Encounter: Payer: Self-pay | Admitting: Family Medicine

## 2021-09-27 DIAGNOSIS — Z23 Encounter for immunization: Secondary | ICD-10-CM | POA: Diagnosis not present

## 2021-09-27 DIAGNOSIS — O09899 Supervision of other high risk pregnancies, unspecified trimester: Secondary | ICD-10-CM | POA: Diagnosis present

## 2021-09-27 HISTORY — DX: Supervision of other high risk pregnancies, unspecified trimester: O09.899

## 2021-09-27 NOTE — Progress Notes (Signed)
DATING AND VIABILITY SONOGRAM ? ? ?BREENA BEVACQUA is a 21 y.o. year old G2P1001 with LMP Patient's last menstrual period was 06/18/2021. which would correlate to  25w3dweeks gestation.  She has regular menstrual cycles.   She is here today for a confirmatory initial sonogram. ? ? ? ?GESTATION: ?SINGLETON    ? ?FETAL ACTIVITY: ?         Heart rate         160 bpm ?         The fetus is active. ? ? ? ? ?ADNEXA: ?The ovaries are normal. ? ? ?GESTATIONAL AGE AND  BIOMETRICS: ? ?Gestational criteria: Estimated Date of Delivery: 03/25/22 by LMP now at 140w3d ?Previous Scans:0 ? ?    ?CROWN RUMP LENGTH           8.40 cm         14-2 weeks  ?         8.35 cm         14-2 weeks  ?    ?    ?    ?    ?    ?    ? ?                                                AVERAGE EGA(BY THIS SCAN):  14-2 weeks ? ?WORKING EDD( LMP ):  03-25-22 ?  ? ? ?TECHNICIAN COMMENTS: ?Patient informed that the ultrasound is considered a limited obstetric ultrasound and is not intended to be a complete ultrasound exam.  Patient also informed that the ultrasound is not being completed with the intent of assessing for fetal or placental anomalies or any pelvic abnormalities. Explained that the purpose of today's ultrasound is to assess for fetal heart rate.  Patient acknowledges the purpose of the exam and the limitations of the study.    ? ?JeKathrene Alu3/03/2022 ?2:19 PM ?  ?

## 2021-09-27 NOTE — Progress Notes (Signed)
?Subjective:  ?Otila Kluver Shorty is a G2P1001 103w3dby LMP c/w 1st trimester USbeing seen today for her first obstetrical visit.  Her obstetrical history is significant for  prior normal vaginal delivery . Patient does intend to breast feed. Pregnancy history fully reviewed. ? ?Patient reports  presyncope, which she had in her last pregnancy . ? ?BP 127/82   Pulse (!) 101   Wt 203 lb (92.1 kg)   LMP 06/18/2021   BMI 31.79 kg/m?  ? ?HISTORY: ?OB History  ?Gravida Para Term Preterm AB Living  ?'2 1 1     1  '$ ?SAB IAB Ectopic Multiple Live Births  ?      0 1  ?  ?# Outcome Date GA Lbr Len/2nd Weight Sex Delivery Anes PTL Lv  ?2 Current           ?1 Term 02/29/20 374w3d 00:25 7 lb 2.6 oz (3.25 kg) F Vag-Spont EPI  LIV  ?   Birth Comments: WDL  ? ? ?Past Medical History:  ?Diagnosis Date  ? Allergy   ? Anxiety   ? Depression   ? Hypertension   ? Hypertension   ? Migraine   ? Migraines   ? Movement disorder   ? Seizures ([place]  ? last seizure in 2009  ? ? ?Past Surgical History:  ?Procedure Laterality Date  ? BUNIONECTOMY Right 07/23/2016  ? BUNIONECTOMY Left 02/2016  ? FOOT SURGERY    ? ? ?Family History  ?Problem Relation Age of Onset  ? Seizures Mother   ? Depression Mother   ? ADD / ADHD Brother   ? Depression Brother   ? Migraines Maternal Grandmother   ? Depression Maternal Grandmother   ? Migraines Maternal Aunt   ? Depression Maternal Aunt   ? Migraines Maternal Uncle   ? ADD / ADHD Cousin   ?     Many Maternal 1st Cousins have AHFort [place]? ? ? ?Exam  ?BP 127/82   Pulse (!) 101   Wt 203 lb (92.1 kg)   LMP 06/18/2021   BMI 31.79 kg/m?  ? ?Chaperone present during exam ? ?CONSTITUTIONAL: Well-developed, well-nourished female in no acute distress.  ?HENT:  Normocephalic, atraumatic, External right and left ear normal. Oropharynx is clear and moist ?EYES: Conjunctivae and EOM are normal. Pupils are equal, round, and reactive to light. No scleral icterus.  ?NECK: Normal range of motion, supple, no masses.  Normal  thyroid.  ?CARDIOVASCULAR: Normal heart rate noted, regular rhythm ?RESPIRATORY: Clear to auscultation bilaterally. Effort and breath sounds normal, no problems with respiration noted. ?BREASTS: Symmetric in size. No masses, skin changes, nipple drainage, or lymphadenopathy. ?ABDOMEN: Soft, normal bowel sounds, no distention noted.  No tenderness, rebound or guarding.  ?PELVIC: Normal appearing external genitalia; normal appearing vaginal mucosa and cervix. No abnormal discharge noted. Normal uterine size, no other palpable masses, no uterine or adnexal tenderness. ?MUSCULOSKELETAL: Normal range of motion. No tenderness.  No cyanosis, clubbing, or edema.  2+ distal pulses. ?SKIN: Skin is warm and dry. No rash noted. Not diaphoretic. No erythema. No pallor. ?NEUROLOGIC: Alert and oriented to person, place, and time. Normal reflexes, muscle tone coordination. No cranial nerve deficit noted. ?PSYCHIATRIC: Normal mood and affect. Normal behavior. Normal judgment and thought content. ? ?  ?Assessment:  ? ? Pregnancy: G2P1001 ?Patient Active Problem List  ? Diagnosis Date Noted  ? Supervision of other high risk pregnancy, antepartum, unspecified trimester 09/27/2021  ? Severe recurrent major depression with psychotic  features ([place]) 08/02/2017  ? Nephrolithiasis   ? Flank pain, acute 02/12/2017  ? Migraine without aura and with status migrainosus, not intractable 07/20/2015  ? Episodic tension-type headache, not intractable 07/20/2015  ? Mood disorder ([place]) 07/20/2015  ? Insomnia 07/20/2015  ? ADHD (attention deficit hyperactivity disorder) 04/09/2013  ? ? ?  ?Plan:  ? ?1. Supervision of other high risk pregnancy, antepartum, unspecified trimester ?FHT and FH normal ? ?  ?Initial labs obtained ?Continue prenatal vitamins ?Reviewed n/v relief measures and warning s/s to report ?Reviewed recommended weight gain based on pre-gravid BMI ?Encouraged well-balanced diet ?Genetic & carrier screening discussed: requests [facility],   ?Ultrasound discussed; fetal survey: requested ?CCNC completed> form faxed if has or is planning to apply for medicaid ?The nature of [place] for [facility] with multiple MDs and other Advanced Practice Providers was explained to patient; also emphasized that fellows, residents, and students are part of our team. ? ? ?Problem list reviewed and updated. ?75% of 30 min visit spent on counseling and coordination of care.  ?  ? ?Truett Mainland ?09/27/2021 ?

## 2021-09-28 LAB — PROTEIN / CREATININE RATIO, URINE
Creatinine, Urine: 114.1 mg/dL
Protein, Ur: 10 mg/dL
Protein/Creat Ratio: 88 mg/g creat (ref 0–200)

## 2021-09-28 LAB — CYTOLOGY - PAP
Adequacy: ABSENT
Chlamydia: NEGATIVE
Comment: NEGATIVE
Comment: NORMAL
Diagnosis: NEGATIVE
Neisseria Gonorrhea: NEGATIVE

## 2021-09-29 LAB — CULTURE, OB URINE

## 2021-09-29 LAB — URINE CULTURE, OB REFLEX

## 2021-10-01 ENCOUNTER — Telehealth: Payer: Self-pay

## 2021-10-01 NOTE — Telephone Encounter (Signed)
Spoke to [place] at [facility] states pt will not need to have H4 labs drawn because she had [place] labs drawn in 2021. He states H14 and H4 are the same labs. H4 lab will be canceled and they will proceed with [facility] lab only. ?Kattleya Kuhnert l Jermarcus Mcfadyen, CMA  ?

## 2021-10-02 LAB — CBC/D/PLT+RPR+RH+ABO+RUBIGG...
Antibody Screen: NEGATIVE
Basophils Absolute: 0 10*3/uL (ref 0.0–0.2)
Basos: 0 %
EOS (ABSOLUTE): 0.1 10*3/uL (ref 0.0–0.4)
Eos: 1 %
HCV Ab: NONREACTIVE
HIV Screen 4th Generation wRfx: NONREACTIVE
Hematocrit: 37.4 % (ref 34.0–46.6)
Hemoglobin: 13.2 g/dL (ref 11.1–15.9)
Hepatitis B Surface Ag: NEGATIVE
Immature Grans (Abs): 0 10*3/uL (ref 0.0–0.1)
Immature Granulocytes: 0 %
Lymphocytes Absolute: 2.5 10*3/uL (ref 0.7–3.1)
Lymphs: 26 %
MCH: 31.6 pg (ref 26.6–33.0)
MCHC: 35.3 g/dL (ref 31.5–35.7)
MCV: 90 fL (ref 79–97)
Monocytes Absolute: 0.6 10*3/uL (ref 0.1–0.9)
Monocytes: 6 %
Neutrophils Absolute: 6.4 10*3/uL (ref 1.4–7.0)
Neutrophils: 67 %
Platelets: 252 10*3/uL (ref 150–450)
RBC: 4.18 x10E6/uL (ref 3.77–5.28)
RDW: 12.8 % (ref 11.7–15.4)
RPR Ser Ql: NONREACTIVE
Rh Factor: POSITIVE
Rubella Antibodies, IGG: 2.11 index (ref >0.99)
WBC: 9.5 10*3/uL (ref 3.4–10.8)

## 2021-10-02 LAB — COMPREHENSIVE METABOLIC PANEL
ALT: 13 IU/L (ref 0–32)
AST: 6 IU/L (ref 0–40)
Albumin/Globulin Ratio: 1.8 (ref 1.2–2.2)
Albumin: 4.6 g/dL (ref 3.9–5.0)
Alkaline Phosphatase: 55 IU/L (ref 44–121)
BUN/Creatinine Ratio: 16 (ref 9–23)
BUN: 10 mg/dL (ref 6–20)
Bilirubin Total: 0.3 mg/dL (ref 0.0–1.2)
CO2: 18 mmol/L — ABNORMAL LOW (ref 20–29)
Calcium: 9.8 mg/dL (ref 8.7–10.2)
Chloride: 100 mmol/L (ref 96–106)
Creatinine, Ser: 0.64 mg/dL (ref 0.57–1.00)
Globulin, Total: 2.5 g/dL (ref 1.5–4.5)
Glucose: 72 mg/dL (ref 70–99)
Potassium: 4.4 mmol/L (ref 3.5–5.2)
Sodium: 137 mmol/L (ref 134–144)
Total Protein: 7.1 g/dL (ref 6.0–8.5)
eGFR: 129 mL/min/{1.73_m2} (ref >59)

## 2021-10-02 LAB — HCV INTERPRETATION

## 2021-10-05 NOTE — Addendum Note (Signed)
Addended by: Truett Mainland on: 10/05/2021 12:45 PM ? ? Modules accepted: Orders ? ?

## 2021-10-31 ENCOUNTER — Ambulatory Visit: Payer: Medicaid Other | Admitting: *Deleted

## 2021-10-31 ENCOUNTER — Ambulatory Visit: Payer: Medicaid Other | Attending: Family Medicine

## 2021-10-31 VITALS — BP 139/78 | HR 91

## 2021-10-31 DIAGNOSIS — O09899 Supervision of other high risk pregnancies, unspecified trimester: Secondary | ICD-10-CM

## 2021-11-01 ENCOUNTER — Encounter: Payer: Self-pay | Admitting: Obstetrics & Gynecology

## 2021-11-01 ENCOUNTER — Ambulatory Visit (INDEPENDENT_AMBULATORY_CARE_PROVIDER_SITE_OTHER): Payer: Medicaid Other | Admitting: Obstetrics & Gynecology

## 2021-11-01 ENCOUNTER — Other Ambulatory Visit: Payer: Self-pay | Admitting: *Deleted

## 2021-11-01 VITALS — BP 132/66 | HR 91 | Wt 207.0 lb

## 2021-11-01 DIAGNOSIS — O99210 Obesity complicating pregnancy, unspecified trimester: Secondary | ICD-10-CM

## 2021-11-01 DIAGNOSIS — O0992 Supervision of high risk pregnancy, unspecified, second trimester: Secondary | ICD-10-CM

## 2021-11-01 DIAGNOSIS — Z362 Encounter for other antenatal screening follow-up: Secondary | ICD-10-CM

## 2021-11-01 DIAGNOSIS — Z8759 Personal history of other complications of pregnancy, childbirth and the puerperium: Secondary | ICD-10-CM | POA: Insufficient documentation

## 2021-11-01 DIAGNOSIS — Z3A19 19 weeks gestation of pregnancy: Secondary | ICD-10-CM

## 2021-11-01 DIAGNOSIS — R002 Palpitations: Secondary | ICD-10-CM

## 2021-11-01 HISTORY — DX: Personal history of other complications of pregnancy, childbirth and the puerperium: Z87.59

## 2021-11-01 MED ORDER — ASPIRIN EC 81 MG PO TBEC: 81.0000 mg | DELAYED_RELEASE_TABLET | Freq: Every day | ORAL | 2 refills | Status: DC

## 2021-11-01 NOTE — Progress Notes (Signed)
? ?PRENATAL VISIT NOTE ? ?Subjective:  ?Stephanie Frazier is a 21 y.o. G2P1001 at 40w3dbeing seen today for ongoing prenatal care.  She is currently monitored for the following issues for this high-risk pregnancy and has ADHD (attention deficit hyperactivity disorder); Migraine without aura and with status migrainosus, not intractable; Episodic tension-type headache, not intractable; Mood disorder ([place]; Insomnia; Flank pain, acute; Nephrolithiasis; Severe recurrent major depression with psychotic features ([place]; Supervision of other high risk pregnancy, antepartum, unspecified trimester; and History of gestational hypertension on their problem list. ? ?Patient reports  worsening palpitations, has a history of this prior to pregnancy. Desires to see a cardiologist .  Contractions: Not present. Vag. Bleeding: None.  Movement: Present. Denies leaking of fluid.  ? ?The following portions of the patient's history were reviewed and updated as appropriate: allergies, current medications, past family history, past medical history, past social history, past surgical history and problem list.  ? ?Objective:  ? ?Vitals:  ? 11/01/21 1416  ?BP: 132/66  ?Pulse: 91  ?Weight: 207 lb (93.9 kg)  ? ? ?Fetal Status: Fetal Heart Rate (bpm): 150   Movement: Present    ? ?General:  Alert, oriented and cooperative. Patient is in no acute distress.  ?Skin: Skin is warm and dry. No rash noted.   ?Cardiovascular: Normal heart rate noted  ?Respiratory: Normal respiratory effort, no problems with respiration noted  ?Abdomen: Soft, gravid, appropriate for gestational age.  Pain/Pressure: Present     ?Pelvic: Cervical exam deferred        ?Extremities: Normal range of motion.  Edema: None  ?Mental Status: Normal mood and affect. Normal behavior. Normal judgment and thought content.  ? ?Imaging: ?UK[place]MFM OB DETAIL +14 WK ? ?Result Date: 10/31/2021 ?----------------------------------------------------------------------  OBSTETRICS REPORT                        (Signed Final 10/31/2021 05:10 pm) ---------------------------------------------------------------------- Patient Info  ID #:       [id number]                         D.O.B.:  [birth year](20 yrs)  Name:       Stephanie Frazier               Visit Date: 10/31/2021 02:08 pm ---------------------------------------------------------------------- Performed By  Attending:        RTama HighMD        Ref. Address:     [address]  [address]  Performed By:     Germain Osgood            [facility]                                                             [place] [facility]                                                             [facility]  Referred By:      Truett Mainland                    MD ---------------------------------------------------------------------- Orders  #  Description                           Code        Ordered By  1  [place] MFM OB DETAIL +14 WK               76811.01    [place] ----------------------------------------------------------------------  #  Order #                     Accession #                Episode #  1  [id number]                   [id number]                 [id number] ---------------------------------------------------------------------- Indications  Encounter for antenatal screening for          Z36.3  malformations  Poor obstetric history: Previous               O09.299  preeclampsia / eclampsia/gestational HTN  Obesity complicating pregnancy, second         O99.212  trimester (BMI 31)  Uterine fibroids affecting pregnancy in        O34.12, D25.9  second trimester, antepartum  Other mental disorder complicating             O99.340  pregnancy, second trimester  [redacted] weeks gestation of  pregnancy                Z3A.19  LR NIPS ---------------------------------------------------------------------- Fetal Evaluation  Num Of Fetuses:         1  Fetal Heart Rate(bpm):  141  Cardiac Activity:       Observed  Presentation:           Cephalic  Placenta:               Posterior Fundal  P. Cord Insertion:  Visualized, central  Amniotic Fluid  AFI FV:      Within normal limits                              Largest Pocket(cm)                              4.7 ---------------------------------------------------------------------- Biometry  BPD:      44.5  mm     G. Age:  19w 3d         34  %    CI:        78.29   %    70 - 86                                                          FL/HC:      17.7   %    16.1 - 18.3  HC:      159.1  mm     G. Age:  18w 5d         19  %    HC/AC:      1.11        1.09 - 1.39  AC:      143.7  mm     G. Age:  19w 5d         60  %    FL/BPD:     63.1   %  FL:       28.1  mm     G. Age:  18w 4d         20  %    FL/AC:      19.6   %    20 - 24  HUM:      27.4  mm     G. Age:  18w 5d         37  %  CER:        20  mm     G. Age:  19w 2d         50  %  NFT:       4.1  mm  LV:        9.5  mm  CM:        3.3  mm  Est. FW:     277  gm    0 lb 10 oz      38  % ---------------------------------------------------------------------- OB History  Gravidity:    2  Living:       1 ---------------------------------------------------------------------- Gestational Age  LMP:           19w 2d        Date:  06/18/21                 EDD:   03/25/22  U/S Today:     19w 1d                                        EDD:   03/26/22  Best:          [name] 2d  Det. By:  LMP  (06/18/21)          EDD:   03/25/22 ---------------------------------------------------------------------- Anatomy  Cranium:               Appears normal         Aortic Arch:            Appears normal  Cavum:                 Appears normal         Ductal Arch:            Appears normal  Ventricles:            Appears normal         Diaphragm:               Appears normal  Choroid Plexus:        Appears normal         Stomach:                Appears normal, left                                                                        sided  Cerebellum:            Appears normal         Abdomen:                Appears normal  Posterior Fossa:       Appears normal         Abdominal Wall:         Appears nml (cord                                                                        insert, abd wall)  Nuchal Fold:           Appears normal         Cord Vessels:           Appears normal (3                                                                        vessel cord)  Face:                  Appears normal         Kidneys:                Appear normal                         (orbits and profile)  Lips:  Appears normal         Bladder:                Appears normal  Thoracic:              Appears normal         Spine:                  Appears normal  Heart:                 Appears normal         Upper Extremities:      Appears normal                         (4CH, axis, and                         situs)  RVOT:                  Appears normal         Lower Extremities:      Appears normal  LVOT:                  Not well visualized  Other:  Fetus appears to be female.Technically difficult due to maternal          habitus. Nasal bone, lenses, maxilla, mandible and falx visualized.          Hands and feet visualized. ---------------------------------------------------------------------- Cervix Uterus Adnexa  Cervix  Length:           3.25  cm.  Not visualized (advanced GA >24wks)  Uterus  Single fibroid noted, see table below.  Right Ovary  Within normal limits.  Left Ovary  Within normal limits.  Cul De Sac  No free fluid seen.  Adnexa  No adnexal mass visualized. ---------------------------------------------------------------------- Myomas  Site                     L(cm)      W(cm)      D(cm)       Location  Anterior Left            3.67        2.3        2.43        Intramural ----------------------------------------------------------------------  Blood Flow                  RI       PI       Comments ---------------------------------------------

## 2021-11-03 LAB — AFP, SERUM, OPEN SPINA BIFIDA
AFP MoM: 1.67
AFP Value: 73.1 ng/mL
Gest. Age on Collection Date: 19.3 weeks
Maternal Age At EDD: 21.9 yr
OSBR Risk 1 IN: 1757
Test Results:: NEGATIVE
Weight: 207 [lb_av]

## 2021-11-29 ENCOUNTER — Encounter: Payer: Self-pay | Admitting: General Practice

## 2021-11-29 ENCOUNTER — Ambulatory Visit (INDEPENDENT_AMBULATORY_CARE_PROVIDER_SITE_OTHER): Payer: Medicaid Other | Admitting: Family Medicine

## 2021-11-29 VITALS — BP 131/71 | HR 97 | Wt 211.0 lb

## 2021-11-29 DIAGNOSIS — Z8759 Personal history of other complications of pregnancy, childbirth and the puerperium: Secondary | ICD-10-CM

## 2021-11-29 DIAGNOSIS — Z3A23 23 weeks gestation of pregnancy: Secondary | ICD-10-CM

## 2021-11-29 DIAGNOSIS — O09899 Supervision of other high risk pregnancies, unspecified trimester: Secondary | ICD-10-CM

## 2021-11-29 DIAGNOSIS — Z3009 Encounter for other general counseling and advice on contraception: Secondary | ICD-10-CM

## 2021-11-29 DIAGNOSIS — G43001 Migraine without aura, not intractable, with status migrainosus: Secondary | ICD-10-CM

## 2021-11-29 MED ORDER — SUMATRIPTAN SUCCINATE 100 MG PO TABS: 100.0000 mg | ORAL_TABLET | Freq: Once | ORAL | 11 refills | Status: DC | PRN

## 2021-11-29 NOTE — Progress Notes (Signed)
? ?  PRENATAL VISIT NOTE ? ?Subjective:  ?Stephanie Frazier is a 21 y.o. G2P1001 at 51w3dbeing seen today for ongoing prenatal care.  She is currently monitored for the following issues for this high-risk pregnancy and has ADHD (attention deficit hyperactivity disorder); Migraine without aura and with status migrainosus, not intractable; Episodic tension-type headache, not intractable; Mood disorder ([place]; Insomnia; Flank pain, acute; Nephrolithiasis; Severe recurrent major depression with psychotic features ([place]; Supervision of other high risk pregnancy, antepartum, unspecified trimester; and History of gestational hypertension on their problem list. ? ?Patient reports  having migraine. Used to be on preventative medication. Has never tried imitrex .  Contractions: Not present. Vag. Bleeding: None.  Movement: Present. Denies leaking of fluid.  ? ?The following portions of the patient's history were reviewed and updated as appropriate: allergies, current medications, past family history, past medical history, past social history, past surgical history and problem list.  ? ?Objective:  ? ?Vitals:  ? 11/29/21 1428 11/29/21 1432  ?BP: (!) 146/91 131/71  ?Pulse: (!) 107 97  ?Weight: 211 lb (95.7 kg)   ? ? ?Fetal Status: Fetal Heart Rate (bpm): 161   Movement: Present    ? ?General:  Alert, oriented and cooperative. Patient is in no acute distress.  ?Skin: Skin is warm and dry. No rash noted.   ?Cardiovascular: Normal heart rate noted  ?Respiratory: Normal respiratory effort, no problems with respiration noted  ?Abdomen: Soft, gravid, appropriate for gestational age.  Pain/Pressure: Present     ?Pelvic: Cervical exam deferred        ?Extremities: Normal range of motion.  Edema: None  ?Mental Status: Normal mood and affect. Normal behavior. Normal judgment and thought content.  ? ?Assessment and Plan:  ?Pregnancy: G2P1001 at 225w3d1. [redacted] weeks gestation of pregnancy ?FHT and FH normal ? ?2. Supervision of other high risk  pregnancy, antepartum, unspecified trimester ? ?3. History of gestational hypertension ?On ASA '81mg'$  ? ?4. Migraine without aura and with status migrainosus, not intractable ?Will try imitrex. ? ?5. Unwanted fertility ?Discussed options for contraception. Patient has thought about this quite a bit and would like tubes tied. Papers signed. ? ? ?Preterm labor symptoms and general obstetric precautions including but not limited to vaginal bleeding, contractions, leaking of fluid and fetal movement were reviewed in detail with the patient. ?Please refer to After Visit Summary for other counseling recommendations.  ? ?No follow-ups on file. ? ?Future Appointments  ?Date Time Provider [place]?11/30/2021  3:00 PM PeJohney FrameHeGreer EeMD [facility] None  ?12/05/2021  3:30 PM [facility] NURSE [facility]  ?12/05/2021  3:45 PM [facility] US5 [facility]US WMC  ?12/27/2021  8:35 AM StNehemiah SettleaTanna SavoyDO [facility] None  ?01/10/2022  4:10 PM StTruett MainlandDO [facility] None  ? ? ?JaTruett MainlandDO ?

## 2021-11-29 NOTE — Progress Notes (Deleted)
?[facility] ? ?New Evaluation ? ?Date:  11/29/2021  ? ?ID:  Stephanie Frazier, DOB [birth year], MRN [id number] ? ?PCP:  Benito Mccreedy, MD ?  ?[facility] Providers ?Cardiologist:  None  ?Electrophysiologist:  None      ? ?Referring MD: Benito Mccreedy, MD  ? ?Chief Complaint: Palpitations ? ?History of Present Illness:   ? ?Stephanie Frazier is a 21 y.o. female [G2P1001] who is being seen today for the evaluation of palpitations at the request of Osei-Bonsu, Iona Beard, MD.  ? ?Patient has history of anxiety, depression, and gestational HTN.  ? ?Today, *** ? ?Prior CV Studies Reviewed: ?The following studies were reviewed today: ?*** ? ?Past Medical History:  ?Diagnosis Date  ? Allergy   ? Anxiety   ? Depression   ? Hypertension   ? Hypertension   ? Migraine   ? Migraines   ? Movement disorder   ? Seizures ([place])   ? last seizure in 2009  ? ? ?Past Surgical History:  ?Procedure Laterality Date  ? BUNIONECTOMY Right 07/23/2016  ? BUNIONECTOMY Left 02/2016  ? FOOT SURGERY    ? { ?Click here to update PMH, PSH, OB Hx then refresh note  :1}  ? ?OB History   ? ? Gravida  ?2  ? Para  ?1  ? Term  ?1  ? Preterm  ?   ? AB  ?   ? Living  ?1  ?  ? ? SAB  ?   ? IAB  ?   ? Ectopic  ?   ? Multiple  ?0  ? Live Births  ?1  ?   ?  ?  ?  { ?Click here to update OB Charting then refresh note  :1}  ? ? ?Current Medications: ?No outpatient medications have been marked as taking for the 11/30/21 encounter (Appointment) with Freada Bergeron, MD.  ?  ? ?Allergies:   Morphine and related, Ondansetron hcl, Zofran, and Ketorolac  ? ?Social History  ? ?Socioeconomic History  ? Marital status: Soil scientist  ?  Spouse name: Not on file  ? Number of children: Not on file  ? Years of education: Not on file  ? Highest education level: Not on file  ?Occupational History  ? Not on file  ?Tobacco Use  ? Smoking status: Never  ?  Passive exposure: Yes  ? Smokeless tobacco: Never  ? Tobacco comments:  ?  Parents smoke outside   ?Vaping Use  ? Vaping Use: Never used  ?Substance and Sexual Activity  ? Alcohol use: No  ?  Comment: not while preg  ? Drug use: No  ? Sexual activity: Yes  ?Other Topics Concern  ? Not on file  ?Social History Narrative  ? [name] is a 12th grade student.  ? She attends [employer]   ? She lives with her mother and step-father.  ? She enjoys art and boxing.  ? ?Social Determinants of Health  ? ?Financial Resource Strain: Not on file  ?Food Insecurity: Not on file  ?Transportation Needs: Not on file  ?Physical Activity: Not on file  ?Stress: Not on file  ?Social Connections: Not on file  ?{ ?Click here to update SDOH then refresh :1}  ? ? ?Family History  ?Problem Relation Age of Onset  ? Seizures Mother   ? Depression Mother   ? ADD / ADHD Brother   ? Depression Brother   ? Migraines Maternal Grandmother   ? Depression Maternal Grandmother   ?  Migraines Maternal Aunt   ? Depression Maternal Aunt   ? Migraines Maternal Uncle   ? ADD / ADHD Cousin   ?     Many Maternal 1st Cousins have [facility]  ? { ?Click here to update FH then refresh note    :1}  ? ?ROS:   ?Please see the history of present illness.    ?*** ?All other systems reviewed and are negative. ? ? ?Labs/EKG Reviewed:   ? ?EKG:   ?EKG is *** ordered today.  The ekg ordered today demonstrates *** ? ?Recent Labs: ?09/27/2021: ALT 13; BUN 10; Creatinine, Ser 0.64; Hemoglobin 13.2; Platelets 252; Potassium 4.4; Sodium 137  ? ?Recent Lipid Panel ?Lab Results  ?Component Value Date/Time  ? CHOL 118 08/06/2017 06:28 AM  ? TRIG 91 08/06/2017 06:28 AM  ? HDL 37 (L) 08/06/2017 06:28 AM  ? CHOLHDL 3.2 08/06/2017 06:28 AM  ? LDLCALC 63 08/06/2017 06:28 AM  ? ? ?Physical Exam:   ? ?VS:  LMP 06/18/2021    ? ?Wt Readings from Last 3 Encounters:  ?11/01/21 207 lb (93.9 kg)  ?09/27/21 203 lb (92.1 kg)  ?04/03/20 183 lb (83 kg) (95 %, Z= 1.65)*  ? ?* Growth percentiles are based on CDC (Girls, 2-20 Years) data.  ?  ? ?GEN: *** Well nourished, well developed in no acute  distress ?HEENT: Normal ?NECK: No JVD; No carotid bruits ?LYMPHATICS: No lymphadenopathy ?CARDIAC: ***RRR, no murmurs, rubs, gallops ?RESPIRATORY:  Clear to auscultation without rales, wheezing or rhonchi  ?ABDOMEN: Soft, non-tender, non-distended ?MUSCULOSKELETAL:  No edema; No deformity  ?SKIN: Warm and dry ?NEUROLOGIC:  Alert and oriented x 3 ?PSYCHIATRIC:  Normal affect  ? ? ?Risk Assessment/Risk Calculators:   ?{ ?Click to calculate CARPREG II - THEN refresh note :1}  ?  ?{ ?Click to caclulate Mod WHO Class of CV Risk - THEN refresh note :1}  ?   ?{ ?Click for [id number] Score - THEN Refresh Note    :[id number]}  ?  ? ? ?ASSESSMENT & PLAN:   ? ?#Palpitations: ?-Check zio monitor ? ?#History of Gestational HTN: ?Higher risk of pre-eclampsia. Currently BP ***. ?-Continue ASA '81mg'$  daily ? ?There are no Patient Instructions on file for this visit. ? ? ?Dispo:  No follow-ups on file.  ? ?Medication Adjustments/Labs and Tests Ordered: ?Current medicines are reviewed at length with the patient today.  Concerns regarding medicines are outlined above.  ?Tests Ordered: ?No orders of the defined types were placed in this encounter. ? ?Medication Changes: ?No orders of the defined types were placed in this encounter. ?  ?

## 2021-11-30 ENCOUNTER — Encounter: Payer: Self-pay | Admitting: Cardiology

## 2021-11-30 ENCOUNTER — Ambulatory Visit (INDEPENDENT_AMBULATORY_CARE_PROVIDER_SITE_OTHER): Payer: Medicaid Other

## 2021-11-30 ENCOUNTER — Ambulatory Visit (INDEPENDENT_AMBULATORY_CARE_PROVIDER_SITE_OTHER): Payer: Medicaid Other | Admitting: Cardiology

## 2021-11-30 VITALS — BP 130/78 | HR 106 | Ht 67.0 in | Wt 212.8 lb

## 2021-11-30 DIAGNOSIS — R42 Dizziness and giddiness: Secondary | ICD-10-CM | POA: Diagnosis not present

## 2021-11-30 DIAGNOSIS — R011 Cardiac murmur, unspecified: Secondary | ICD-10-CM

## 2021-11-30 DIAGNOSIS — R002 Palpitations: Secondary | ICD-10-CM

## 2021-11-30 DIAGNOSIS — R931 Abnormal findings on diagnostic imaging of heart and coronary circulation: Secondary | ICD-10-CM | POA: Diagnosis not present

## 2021-11-30 NOTE — Progress Notes (Unsigned)
Enrolled patient for a 7 day Zio XT monitor to be mailed to patients home.  

## 2021-11-30 NOTE — Progress Notes (Signed)
?[facility] ? ?New Evaluation ? ?Date:  11/30/2021  ? ?ID:  Stephanie Frazier, DOB 02-02-2001, MRN [id number] ? ?PCP:  Benito Mccreedy, MD ?  ?[facility] Providers ?Cardiologist:  None  ?Electrophysiologist:  None      ? ?Referring MD: Benito Mccreedy, MD  ? ?Chief Complaint: Palpitations ? ?History of Present Illness:   ? ?Stephanie Frazier is a 21 y.o. female [G2P1001] who is being seen today for the evaluation of palpitations at the request of Osei-Bonsu, Iona Beard, MD.  ? ?Patient has history of anxiety, depression, and gestational HTN.  ? ?Today, patient reports palpitations that have been present for several years with associated chest tightness, Sob, lightheadedness, and nausea. These episodes have continued throughout pregnancy but have not worsened. They occur about 1-2 times per week and will wax and wane in intensity for a few minutes. No inciting or alleviating factors noted.  ? ?Does sometimes have a sharp, stabbing sensation in chest that will radiate to her back with mild SOB. Last episode was last night and has previously occurred intermittently for hours. Denies any diaphoresis or vomiting during these episodes.  ? ?Patient notes a history of HTN during childhood that was never evaluated. She reports that it had improved after finishing school but she also had gestational HTN during last pregnancy. She does not check her BP at home. ? ?Patient does report that she had an echocardiogram 6 years prior due to the palpitations and was told she had "an enlarged LV" but had no further follow-up. ? ?Prior CV Studies Reviewed: ?The following studies were reviewed today: ?Prior ECGs: NSR with no acute abnormalities on 08/03/2017, 02/12/2017 ? ?Past Medical History:  ?Diagnosis Date  ? Allergy   ? Anxiety   ? Depression   ? Hypertension   ? Hypertension   ? Migraine   ? Migraines   ? Movement disorder   ? Seizures ([place])   ? last seizure in 2009  ? ? ?Past Surgical History:  ?Procedure  Laterality Date  ? BUNIONECTOMY Right 07/23/2016  ? BUNIONECTOMY Left 02/2016  ? FOOT SURGERY    ?   ? ?OB History   ? ? Gravida  ?2  ? Para  ?1  ? Term  ?1  ? Preterm  ?   ? AB  ?   ? Living  ?1  ?  ? ? SAB  ?   ? IAB  ?   ? Ectopic  ?   ? Multiple  ?0  ? Live Births  ?1  ?   ?  ?  ?    ? ? ?Current Medications: ?Current Meds  ?Medication Sig  ? aspirin EC 81 MG tablet Take 1 tablet (81 mg total) by mouth daily. Take after 12 weeks for prevention of preeclampsia later in pregnancy  ? Prenatal Vit-Fe Fumarate-FA (PRENATAL VITAMINS PO) Take by mouth.  ? SUMAtriptan (IMITREX) 100 MG tablet Take 1 tablet (100 mg total) by mouth once as needed for up to 1 dose for migraine. May repeat in 2 hours if headache persists or recurs.  ?  ? ?Allergies:   Morphine and related, Ondansetron hcl, Zofran, and Ketorolac  ? ?Social History  ? ?Socioeconomic History  ? Marital status: Soil scientist  ?  Spouse name: Not on file  ? Number of children: Not on file  ? Years of education: Not on file  ? Highest education level: Not on file  ?Occupational History  ? Not on file  ?Tobacco  Use  ? Smoking status: Never  ?  Passive exposure: Yes  ? Smokeless tobacco: Never  ? Tobacco comments:  ?  Parents smoke outside  ?Vaping Use  ? Vaping Use: Never used  ?Substance and Sexual Activity  ? Alcohol use: No  ?  Comment: not while preg  ? Drug use: No  ? Sexual activity: Yes  ?Other Topics Concern  ? Not on file  ?Social History Narrative  ? Jerrine is a 12th grade student.  ? She attends [employer]   ? She lives with her mother and step-father.  ? She enjoys art and boxing.  ? ?Social Determinants of Health  ? ?Financial Resource Strain: Not on file  ?Food Insecurity: Not on file  ?Transportation Needs: Not on file  ?Physical Activity: Not on file  ?Stress: Not on file  ?Social Connections: Not on file  ?  ? ? ?Family History  ?Problem Relation Age of Onset  ? Seizures Mother   ? Depression Mother   ? ADD / ADHD Brother   ? Depression  Brother   ? Migraines Maternal Grandmother   ? Depression Maternal Grandmother   ? Migraines Maternal Aunt   ? Depression Maternal Aunt   ? Migraines Maternal Uncle   ? ADD / ADHD Cousin   ?     Many Maternal 1st Cousins have [place]  ?   ? ?ROS:   ?Please see the history of present illness.    ?Denies: vision changes, headaches ? ?All other systems reviewed and are negative. ? ? ?Labs/EKG Reviewed:   ? ?EKG:   ?EKG ordered today.  The ekg ordered today demonstrates sinus tachycardia at 103 BMP, largely unchanged from prior ECGs. ? ?Recent Labs: ?09/27/2021: ALT 13; BUN 10; Creatinine, Ser 0.64; Hemoglobin 13.2; Platelets 252; Potassium 4.4; Sodium 137  ? ?Recent Lipid Panel ?Lab Results  ?Component Value Date/Time  ? CHOL 118 08/06/2017 06:28 AM  ? TRIG 91 08/06/2017 06:28 AM  ? HDL 37 (L) 08/06/2017 06:28 AM  ? CHOLHDL 3.2 08/06/2017 06:28 AM  ? LDLCALC 63 08/06/2017 06:28 AM  ? ? ?Physical Exam:   ? ?VS:  BP 130/78 (BP Location: Right Arm)   Pulse (!) 106   Ht '5\' 7"'$  (1.702 m)   Wt 212 lb 12.8 oz (96.5 kg)   LMP 06/18/2021   SpO2 98%   BMI 33.33 kg/m?    ? ?Wt Readings from Last 3 Encounters:  ?11/30/21 212 lb 12.8 oz (96.5 kg)  ?11/29/21 211 lb (95.7 kg)  ?11/01/21 207 lb (93.9 kg)  ?  ? ?GEN:  Well nourished, well developed in no acute distress ?HEENT: Normal ?NECK: No JVD; No carotid bruits ?CARDIAC: RR, 2/6 systolic murmur LUSB ?RESPIRATORY:  Clear to auscultation without rales, wheezing or rhonchi  ?ABDOMEN: Gravid, soft ?MUSCULOSKELETAL:  No edema; No deformity  ?SKIN: Warm and dry ?NEUROLOGIC:  Alert and oriented x 3 ?PSYCHIATRIC:  Normal affect  ? ?  ? ?ASSESSMENT & PLAN:   ? ?#Palpitations: ?Present before pregnancy and have persisted. Does have associated symptoms of lightheadedness, nausea, SOB. Had one episode of syncope with prior pregnancy. Currently having symptoms about every other day lasting minutes at a time. Will check zio monitor for further evaluation. ?- Check zio monitor x7  days ? ?#History of Gestational HTN: ?Higher risk of pre-eclampsia. Currently BP 130/80; no indication for pharmacotherapy currently. ?-Continue ASA '81mg'$  daily ? ?#Murmur, Hx of abnormal echo ?Patient reports history of an abnormal TTE that  was obtained prior to ever becoming pregnant for further work-up of palpitations. Will repeat for monitoring. Suspect murmur is benign murmur of pregnancy. ?- Check TTE ? ?#Lightheadedness: ?#Orthostasis of pregnancy: ?Had episodes with prior pregnancy and continues to have lightheadedness with position changes and prolonged standing with current pregnancy. No episodes of syncope recently. Has been trying to remain hydrated. States that electrolyte drinks and compression socks do not work for her. Will continue with conservative management and perform work-up as detailed above for palpitations.  ? ?Patient Instructions  ?Medication Instructions:  ? ?Your physician recommends that you continue on your current medications as directed. Please refer to the Current Medication list given to you today. ? ?*If you need a refill on your cardiac medications before your next appointment, please call your pharmacy* ? ? ?Testing/Procedures: ? ?Your physician has requested that you have an echocardiogram. Echocardiography is a painless test that uses sound waves to create images of your heart. It provides your doctor with information about the size and shape of your heart and how well your heart?s chambers and valves are working. This procedure takes approximately one hour. There are no restrictions for this procedure.  Cardiac-OB patient: to be performed by [facility] or [place].  ? ? ?ZIO XT- Long Term Monitor Instructions ? ?Your physician has requested you wear a ZIO patch monitor for 7 days.  ?This is a single patch monitor. [employer] supplies one patch monitor per enrollment. Additional ?stickers are not available. Please do not apply patch if you will be having a Nuclear Stress Test,   ?Echocardiogram, Cardiac CT, MRI, or Chest Xray during the period you would be wearing the  ?monitor. The patch cannot be worn during these tests. You cannot remove and re-apply the  ?ZIO XT patch monitor.  ?Your ZIO patch monitor will

## 2021-11-30 NOTE — Patient Instructions (Signed)
Medication Instructions:  ? ?Your physician recommends that you continue on your current medications as directed. Please refer to the Current Medication list given to you today. ? ?*If you need a refill on your cardiac medications before your next appointment, please call your pharmacy* ? ? ?Testing/Procedures: ? ?Your physician has requested that you have an echocardiogram. Echocardiography is a painless test that uses sound waves to create images of your heart. It provides your doctor with information about the size and shape of your heart and how well your heart?s chambers and valves are working. This procedure takes approximately one hour. There are no restrictions for this procedure.  Cardiac-OB patient: to be performed by [facility] or [place].  ? ? ?ZIO XT- Long Term Monitor Instructions ? ?Your physician has requested you wear a ZIO patch monitor for 7 days.  ?This is a single patch monitor. [employer] supplies one patch monitor per enrollment. Additional ?stickers are not available. Please do not apply patch if you will be having a Nuclear Stress Test,  ?Echocardiogram, Cardiac CT, MRI, or Chest Xray during the period you would be wearing the  ?monitor. The patch cannot be worn during these tests. You cannot remove and re-apply the  ?ZIO XT patch monitor.  ?Your ZIO patch monitor will be mailed 3 day USPS to your address on file. It may take 3-5 days  ?to receive your monitor after you have been enrolled.  ?Once you have received your monitor, please review the enclosed instructions. Your monitor  ?has already been registered assigning a specific monitor serial # to you. ? ?Billing and Patient Assistance Program Information ? ?We have supplied [employer] with any of your insurance information on file for billing purposes. ?[employer] offers a sliding scale Patient Assistance Program for patients that do not have  ?insurance, or whose insurance does not completely cover the cost of the ZIO monitor.  ?You must apply for  the Patient Assistance Program to qualify for this discounted rate.  ?To apply, please call [employer] at [phone], select option 4, select option 2, ask to apply for  ?Patient Assistance Program. Theodore Demark will ask your household income, and how many people  ?are in your household. They will quote your out-of-pocket cost based on that information.  ?[employer] will also be able to set up a 59-month interest-free payment plan if needed. ? ?Applying the monitor ?  ?Shave hair from upper left chest.  ?Hold abrader disc by orange tab. Rub abrader in 40 strokes over the upper left chest as  ?indicated in your monitor instructions.  ?Clean area with 4 enclosed alcohol pads. Let dry.  ?Apply patch as indicated in monitor instructions. Patch will be placed under collarbone on left  ?side of chest with arrow pointing upward.  ?Rub patch adhesive wings for 2 minutes. Remove white label marked "1". Remove the white  ?label marked "2". Rub patch adhesive wings for 2 additional minutes.  ?While looking in a mirror, press and release button in center of patch. A small green light will  ?flash 3-4 times. This will be your only indicator that the monitor has been turned on.  ?Do not shower for the first 24 hours. You may shower after the first 24 hours.  ?Press the button if you feel a symptom. You will hear a small click. Record Date, Time and  ?Symptom in the Patient Logbook.  ?When you are ready to remove the patch, follow instructions on the last 2 pages of Patient  ?Logbook. Stick patch  monitor onto the last page of Patient Logbook.  ?Place Patient Logbook in the blue and white box. Use locking tab on box and tape box closed  ?securely. The blue and white box has prepaid postage on it. Please place it in the mailbox as  ?soon as possible. Your physician should have your test results approximately 7 days after the  ?monitor has been mailed back to [facility]  ?Call [facility] at [phone] if you have  questions regarding  ?your ZIO XT patch monitor. Call them immediately if you see an orange light blinking on your  ?monitor.  ?If your monitor falls off in less than 4 days, contact our Monitor department at [phone].  ?If your monitor becomes loose or falls off after 4 days call [employer] at [phone] for  ?suggestions on securing your monitor ? ? ? ?Follow-Up: ? ?6 weeks or sometime in July with Dr. Johney Frame at [facility]  ? ? ?Important Information About [employer] ? ? ? ? ? ? ?

## 2021-12-03 DIAGNOSIS — R002 Palpitations: Secondary | ICD-10-CM

## 2021-12-05 ENCOUNTER — Ambulatory Visit: Payer: Medicaid Other | Admitting: *Deleted

## 2021-12-05 ENCOUNTER — Ambulatory Visit: Payer: Medicaid Other | Attending: Obstetrics and Gynecology

## 2021-12-05 VITALS — BP 123/70 | HR 95

## 2021-12-05 DIAGNOSIS — O99212 Obesity complicating pregnancy, second trimester: Secondary | ICD-10-CM | POA: Diagnosis not present

## 2021-12-05 DIAGNOSIS — O3412 Maternal care for benign tumor of corpus uteri, second trimester: Secondary | ICD-10-CM

## 2021-12-05 DIAGNOSIS — Z8759 Personal history of other complications of pregnancy, childbirth and the puerperium: Secondary | ICD-10-CM | POA: Insufficient documentation

## 2021-12-05 DIAGNOSIS — O09292 Supervision of pregnancy with other poor reproductive or obstetric history, second trimester: Secondary | ICD-10-CM

## 2021-12-05 DIAGNOSIS — Z362 Encounter for other antenatal screening follow-up: Secondary | ICD-10-CM | POA: Insufficient documentation

## 2021-12-05 DIAGNOSIS — E669 Obesity, unspecified: Secondary | ICD-10-CM

## 2021-12-05 DIAGNOSIS — O09899 Supervision of other high risk pregnancies, unspecified trimester: Secondary | ICD-10-CM | POA: Insufficient documentation

## 2021-12-05 DIAGNOSIS — Z3A24 24 weeks gestation of pregnancy: Secondary | ICD-10-CM

## 2021-12-05 DIAGNOSIS — D259 Leiomyoma of uterus, unspecified: Secondary | ICD-10-CM

## 2021-12-08 ENCOUNTER — Encounter (HOSPITAL_COMMUNITY): Payer: Self-pay | Admitting: Obstetrics & Gynecology

## 2021-12-08 ENCOUNTER — Inpatient Hospital Stay (HOSPITAL_COMMUNITY)
Admission: AD | Admit: 2021-12-08 | Discharge: 2021-12-09 | Disposition: A | Discharge status: Home / Self Care | Payer: Medicaid Other | Attending: Obstetrics & Gynecology | Admitting: Obstetrics & Gynecology

## 2021-12-08 ENCOUNTER — Encounter: Payer: Self-pay | Admitting: Family Medicine

## 2021-12-08 DIAGNOSIS — R110 Nausea: Secondary | ICD-10-CM

## 2021-12-08 DIAGNOSIS — R42 Dizziness and giddiness: Secondary | ICD-10-CM | POA: Insufficient documentation

## 2021-12-08 DIAGNOSIS — Z7982 Long term (current) use of aspirin: Secondary | ICD-10-CM | POA: Diagnosis not present

## 2021-12-08 DIAGNOSIS — O09292 Supervision of pregnancy with other poor reproductive or obstetric history, second trimester: Secondary | ICD-10-CM | POA: Insufficient documentation

## 2021-12-08 DIAGNOSIS — O99352 Diseases of the nervous system complicating pregnancy, second trimester: Secondary | ICD-10-CM | POA: Insufficient documentation

## 2021-12-08 DIAGNOSIS — G43009 Migraine without aura, not intractable, without status migrainosus: Secondary | ICD-10-CM | POA: Diagnosis present

## 2021-12-08 DIAGNOSIS — Z3A24 24 weeks gestation of pregnancy: Secondary | ICD-10-CM | POA: Diagnosis not present

## 2021-12-08 DIAGNOSIS — O09899 Supervision of other high risk pregnancies, unspecified trimester: Secondary | ICD-10-CM

## 2021-12-08 DIAGNOSIS — Z8759 Personal history of other complications of pregnancy, childbirth and the puerperium: Secondary | ICD-10-CM | POA: Diagnosis not present

## 2021-12-08 LAB — URINALYSIS, ROUTINE W REFLEX MICROSCOPIC
Bilirubin Urine: NEGATIVE
Glucose, UA: NEGATIVE mg/dL
Hgb urine dipstick: NEGATIVE
Ketones, ur: NEGATIVE mg/dL
Leukocytes,Ua: NEGATIVE
Nitrite: NEGATIVE
Protein, ur: NEGATIVE mg/dL
Specific Gravity, Urine: 1.018 (ref 1.005–1.030)
pH: 6 (ref 5.0–8.0)

## 2021-12-08 LAB — COMPREHENSIVE METABOLIC PANEL
ALT: 13 U/L (ref 0–44)
AST: 16 U/L (ref 15–41)
Albumin: 2.8 g/dL — ABNORMAL LOW (ref 3.5–5.0)
Alkaline Phosphatase: 70 U/L (ref 38–126)
Anion gap: 5 (ref 5–15)
BUN: 12 mg/dL (ref 6–20)
CO2: 25 mmol/L (ref 22–32)
Calcium: 9.1 mg/dL (ref 8.9–10.3)
Chloride: 104 mmol/L (ref 98–111)
Creatinine, Ser: 0.68 mg/dL (ref 0.44–1.00)
GFR, Estimated: >60 mL/min (ref >60)
Glucose, Bld: 92 mg/dL (ref 70–99)
Potassium: 4.6 mmol/L (ref 3.5–5.1)
Sodium: 134 mmol/L — ABNORMAL LOW (ref 135–145)
Total Bilirubin: 0.2 mg/dL — ABNORMAL LOW (ref 0.3–1.2)
Total Protein: 6.5 g/dL (ref 6.5–8.1)

## 2021-12-08 LAB — CBC
HCT: 34 % — ABNORMAL LOW (ref 36.0–46.0)
Hemoglobin: 11.8 g/dL — ABNORMAL LOW (ref 12.0–15.0)
MCH: 32 pg (ref 26.0–34.0)
MCHC: 34.7 g/dL (ref 30.0–36.0)
MCV: 92.1 fL (ref 80.0–100.0)
Platelets: 204 10*3/uL (ref 150–400)
RBC: 3.69 MIL/uL — ABNORMAL LOW (ref 3.87–5.11)
RDW: 14.1 % (ref 11.5–15.5)
WBC: 13.1 10*3/uL — ABNORMAL HIGH (ref 4.0–10.5)
nRBC: 0 % (ref 0.0–0.2)

## 2021-12-08 LAB — PROTEIN / CREATININE RATIO, URINE
Creatinine, Urine: 123.16 mg/dL
Protein Creatinine Ratio: 0.08 mg/mg{Cre} (ref 0.00–0.15)
Total Protein, Urine: 10 mg/dL

## 2021-12-08 MED ORDER — PROMETHAZINE HCL 25 MG/ML IJ SOLN
25.0000 mg | Freq: Once | INTRAMUSCULAR | Status: AC
  Administered 2021-12-08: 25 mg via INTRAMUSCULAR
  Filled 2021-12-08: qty 1

## 2021-12-08 MED ORDER — ACETAMINOPHEN 500 MG PO TABS
1000.0000 mg | ORAL_TABLET | Freq: Once | ORAL | Status: AC
  Administered 2021-12-08: 1000 mg via ORAL
  Filled 2021-12-08: qty 2

## 2021-12-08 MED ORDER — CAFFEINE 200 MG PO TABS
200.0000 mg | ORAL_TABLET | Freq: Once | ORAL | Status: AC
  Administered 2021-12-08: 200 mg via ORAL
  Filled 2021-12-08: qty 1

## 2021-12-08 NOTE — MAU Provider Note (Signed)
History     [id number]  Arrival date and time: 12/08/21 2041    Chief Complaint  Patient presents with   Headache   Dizziness   Shortness of Breath     HPI Stephanie Frazier is a 21 y.o. at 48w5dby LMP with PMHx notable for chronic migraines, palpitations and anxiety, who presents for dizziness, headache and on occasion SOB.  She is currently under the management of cardiology- wearing a monitor and has follow up appointment scheduled for 6/2. Her headaches have been an ongoing issue for many years- no improvement with tylenol or imitrex.  She last tried these medications a few days ago.  She decided to come in to day because she woke up with dizziness and it has continued all day.  She feels unstable when she tries to stand as though she is spinning.  Denies vaginal bleeding, contractions, no LOF.  Good fetal movement.  OB History     Gravida  2   Para  1   Term  1   Preterm      AB      Living  1      SAB      IAB      Ectopic      Multiple  0   Live Births  1           Past Medical History:  Diagnosis Date   Allergy    Anxiety    Depression    Hypertension    Migraines    Movement disorder    [place]    last seizure in 2009    Past Surgical History:  Procedure Laterality Date   BUNIONECTOMY Right 07/23/2016   BUNIONECTOMY Left 02/2016   FOOT SURGERY      Family History  Problem Relation Age of Onset   Seizures Mother    Depression Mother    ADD / ADHD Brother    Depression Brother    Migraines Maternal Grandmother    Depression Maternal Grandmother    Migraines Maternal Aunt    Depression Maternal Aunt    Migraines Maternal Uncle    ADD / ADHD Cousin        Many Maternal 1st Cousins have AIntegris Deaconess   Allergies  Allergen Reactions   Morphine And Related Shortness Of Breath and Other (See Comments)    Headache and "trouble breathing"   Ondansetron Hcl Rash   Zofran Rash   Ketorolac Rash    No current  facility-administered medications on file prior to encounter.   Current Outpatient Medications on File Prior to Encounter  Medication Sig Dispense Refill   aspirin EC 81 MG tablet Take 1 tablet (81 mg total) by mouth daily. Take after 12 weeks for prevention of preeclampsia later in pregnancy 300 tablet 2   Prenatal Vit-Fe Fumarate-FA (PRENATAL VITAMINS PO) Take by mouth.     SUMAtriptan (IMITREX) 100 MG tablet Take 1 tablet (100 mg total) by mouth once as needed for up to 1 dose for migraine. May repeat in 2 hours if headache persists or recurs. 9 tablet 11     Review of Systems  Constitutional:  Negative for chills, diaphoresis, fever, malaise/fatigue and weight loss.  HENT: Negative.    Eyes:        Notes change in vision, like things are spinning  Respiratory: Negative.    Cardiovascular:  Negative for chest pain and palpitations.  Gastrointestinal:  Positive for nausea. Negative for abdominal pain,  constipation, diarrhea, heartburn and vomiting.  Genitourinary:  Negative for dysuria, frequency, hematuria and urgency.  Musculoskeletal:  Negative for back pain, myalgias and neck pain.  Skin: Negative.   Neurological:  Positive for dizziness and headaches. Negative for sensory change, speech change and weakness.  Psychiatric/Behavioral:  The patient is nervous/anxious.   Pertinent positives and negative per HPI, all others reviewed and negative  Physical Exam   BP 138/81 (BP Location: Right Arm)   Pulse (!) 117   Temp 97.8 F (36.6 C) (Oral)   Resp 18   Ht '5\' 7"'$  (1.702 m)   Wt 96.8 kg   LMP 06/18/2021   SpO2 96%   BMI 33.41 kg/m   Patient Vitals for the past 24 hrs:  BP Temp Temp src Pulse Resp SpO2 Height Weight  12/08/21 2126 138/81 -- -- (!) 117 -- -- -- --  12/08/21 2125 -- -- -- -- -- 96 % -- --  12/08/21 2122 129/82 -- -- (!) 124 -- -- -- --  12/08/21 2121 134/76 -- -- (!) 102 -- -- -- --  12/08/21 2120 132/69 -- -- 96 -- 97 % -- --  12/08/21 2051 (!) 160/96 97.8  F (36.6 C) Oral (!) 105 18 98 % '5\' 7"'$  (1.702 m) 96.8 kg    Physical Exam Constitutional:      Appearance: She is well-developed.  HENT:     Head: Normocephalic.  Eyes:     General: No visual field deficit. Cardiovascular:     Rate and Rhythm: Normal rate and regular rhythm.     Heart sounds: Murmur heard.  Abdominal:     Comments: Gravid, soft and non-tender  Musculoskeletal:     Cervical back: Normal range of motion and neck supple.  Skin:    General: Skin is warm and dry.  Neurological:     Mental Status: She is alert and oriented to person, place, and time.     Cranial Nerves: No cranial nerve deficit or facial asymmetry.     Sensory: No sensory deficit.     Motor: No weakness.     Coordination: Coordination normal.     Gait: Gait normal.     Comments: Normal gait  Psychiatric:        Mood and Affect: Mood normal.        Behavior: Behavior normal.    FHT Baseline 150, moderate variability, +10x10 accels x 2, no decels Toco: no contractions Cat: I, reactive for current gestational age  Labs Results for orders placed or performed during the hospital encounter of 12/08/21 (from the past 24 hour(s))  Urinalysis, Routine w reflex microscopic Urine, Clean Catch     Status: Abnormal   Collection Time: 12/08/21  9:12 PM  Result Value Ref Range   Color, Urine YELLOW YELLOW   APPearance HAZY (A) CLEAR   Specific Gravity, Urine 1.018 1.005 - 1.030   pH 6.0 5.0 - 8.0   Glucose, UA NEGATIVE NEGATIVE mg/dL   Hgb urine dipstick NEGATIVE NEGATIVE   Bilirubin Urine NEGATIVE NEGATIVE   Ketones, ur NEGATIVE NEGATIVE mg/dL   Protein, ur NEGATIVE NEGATIVE mg/dL   Nitrite NEGATIVE NEGATIVE   Leukocytes,Ua NEGATIVE NEGATIVE  Protein / creatinine ratio, urine     Status: None   Collection Time: 12/08/21  9:12 PM  Result Value Ref Range   Creatinine, Urine 123.16 mg/dL   Total Protein, Urine 10 mg/dL   Protein Creatinine Ratio 0.08 0.00 - 0.15 mg/mg[Cre]  CBC  Status:  Abnormal   Collection Time: 12/08/21 10:16 PM  Result Value Ref Range   WBC 13.1 (H) 4.0 - 10.5 K/uL   RBC 3.69 (L) 3.87 - 5.11 MIL/uL   Hemoglobin 11.8 (L) 12.0 - 15.0 g/dL   HCT 34.0 (L) 36.0 - 46.0 %   MCV 92.1 80.0 - 100.0 fL   MCH 32.0 26.0 - 34.0 pg   MCHC 34.7 30.0 - 36.0 g/dL   RDW 14.1 11.5 - 15.5 %   Platelets 204 150 - 400 K/uL   nRBC 0.0 0.0 - 0.2 %  Comprehensive metabolic panel     Status: Abnormal   Collection Time: 12/08/21 10:16 PM  Result Value Ref Range   Sodium 134 (L) 135 - 145 mmol/L   Potassium 4.6 3.5 - 5.1 mmol/L   Chloride 104 98 - 111 mmol/L   CO2 25 22 - 32 mmol/L   Glucose, Bld 92 70 - 99 mg/dL   BUN 12 6 - 20 mg/dL   Creatinine, Ser 0.68 0.44 - 1.00 mg/dL   Calcium 9.1 8.9 - 10.3 mg/dL   Total Protein 6.5 6.5 - 8.1 g/dL   Albumin 2.8 (L) 3.5 - 5.0 g/dL   AST 16 15 - 41 U/L   ALT 13 0 - 44 U/L   Alkaline Phosphatase 70 38 - 126 U/L   Total Bilirubin 0.2 (L) 0.3 - 1.2 mg/dL   GFR, Estimated >60 >60 mL/min   Anion gap 5 5 - 15    MAU Course  Procedures Lab Orders         Urinalysis, Routine w reflex microscopic Urine, Clean Catch         CBC         Comprehensive metabolic panel         Protein / creatinine ratio, urine     Meds ordered this encounter  Medications   caffeine tablet 200 mg   acetaminophen (TYLENOL) tablet 1,000 mg   promethazine (PHENERGAN) injection 25 mg   promethazine (PHENERGAN) 25 MG tablet    Sig: Take 1 tablet (25 mg total) by mouth every 6 (six) hours as needed for nausea or vomiting (headache).    Dispense:  30 tablet    Refill:  0   metoprolol tartrate (LOPRESSOR) 50 MG tablet    Sig: Take 1 tablet (50 mg total) by mouth daily.    Dispense:  30 tablet    Refill:  1   Imaging Orders  No imaging studies ordered today    MDM History and exam completed Fetal-well being reassuring EKG normal Lab work as above- no abnormalities noted Po hydration and oral tylenol and caffeine- no improvement IM Phenergan-  notes improvement of headache  Assessment and Plan   1. Supervision of other high risk pregnancy, antepartum, unspecified trimester   2. History of gestational hypertension   3. Dizziness   4. Migraine without aura and without status migrainosus, not intractable   5. [redacted] weeks gestation of pregnancy   6. Nausea   -pt ruled out for acute etiology -initial BP elevated x 1, remaining BPs within normal range -encouraged adequate po hydration -reviewed current medication that she has tried, plan to stop imitrex since she did not have any improvement -trial of betablocker- metoprolol '50mg'$  daily and phenergan '25mg'$  prn --plan to follow up with cardiology and OB as scheduled -questions and concerns were addressed, agreeable with above plan  Annalee Genta, DO 12/09/21 12:11 AM

## 2021-12-08 NOTE — MAU Note (Signed)
Stephanie Frazier is a 21 y.o. at 17w5dhere in MAU reporting: Migraines since 5/11. Is taking sumatriptin for migraines, and not helping.  Diziness and nausea. Also feel SOB. Is wearing cardiac monitor and being followed by cardiology.  LMP:  Onset of complaint: 5/11 Pain score: 5/10 Vitals:   12/08/21 2051  BP: (!) 160/96  Pulse: (!) 105  Resp: 18  Temp: 97.8 F (36.6 C)  SpO2: 98%     FHT:155 Lab orders placed from triage: Urinalysis

## 2021-12-09 DIAGNOSIS — R42 Dizziness and giddiness: Secondary | ICD-10-CM | POA: Diagnosis not present

## 2021-12-09 MED ORDER — METOPROLOL TARTRATE 50 MG PO TABS: 50.0000 mg | ORAL_TABLET | Freq: Every day | ORAL | 1 refills | Status: DC

## 2021-12-09 MED ORDER — PROMETHAZINE HCL 25 MG PO TABS: 25.0000 mg | ORAL_TABLET | Freq: Four times a day (QID) | ORAL | 0 refills | Status: DC | PRN

## 2021-12-11 MED ORDER — CYCLOBENZAPRINE HCL 10 MG PO TABS: 10.0000 mg | ORAL_TABLET | Freq: Three times a day (TID) | ORAL | 2 refills | Status: DC | PRN

## 2021-12-21 ENCOUNTER — Ambulatory Visit (HOSPITAL_COMMUNITY): Payer: Medicaid Other | Attending: Cardiology

## 2021-12-21 DIAGNOSIS — R011 Cardiac murmur, unspecified: Secondary | ICD-10-CM | POA: Insufficient documentation

## 2021-12-21 DIAGNOSIS — R931 Abnormal findings on diagnostic imaging of heart and coronary circulation: Secondary | ICD-10-CM | POA: Insufficient documentation

## 2021-12-21 DIAGNOSIS — R002 Palpitations: Secondary | ICD-10-CM

## 2021-12-23 LAB — ECHOCARDIOGRAM COMPLETE
Area-P 1/2: 4.79 cm2
S' Lateral: 2.65 cm

## 2021-12-26 ENCOUNTER — Encounter (INDEPENDENT_AMBULATORY_CARE_PROVIDER_SITE_OTHER): Payer: Medicaid Other | Admitting: Cardiology

## 2021-12-26 DIAGNOSIS — R42 Dizziness and giddiness: Secondary | ICD-10-CM | POA: Diagnosis not present

## 2021-12-26 DIAGNOSIS — R002 Palpitations: Secondary | ICD-10-CM

## 2021-12-26 NOTE — Telephone Encounter (Signed)
Please see the MyChart message reply(ies) for my assessment and plan.    This patient gave consent for this Medical Advice Message and is aware that it may result in a bill to [employer], as well as the possibility of receiving a bill for a co-payment or deductible. They are an established patient, but are not seeking medical advice exclusively about a problem treated during an in person or video visit in the last seven days. I did not recommend an in person or video visit within seven days of my reply.    I spent a total of 6 minutes cumulative time within 7 days through [employer].  Freada Bergeron, MD

## 2021-12-27 ENCOUNTER — Ambulatory Visit (INDEPENDENT_AMBULATORY_CARE_PROVIDER_SITE_OTHER): Payer: Medicaid Other | Admitting: Family Medicine

## 2021-12-27 ENCOUNTER — Encounter: Payer: Self-pay | Admitting: General Practice

## 2021-12-27 VITALS — BP 134/79 | HR 83 | Wt 218.0 lb

## 2021-12-27 DIAGNOSIS — Z3A27 27 weeks gestation of pregnancy: Secondary | ICD-10-CM

## 2021-12-27 DIAGNOSIS — Z23 Encounter for immunization: Secondary | ICD-10-CM | POA: Diagnosis not present

## 2021-12-27 DIAGNOSIS — Z8759 Personal history of other complications of pregnancy, childbirth and the puerperium: Secondary | ICD-10-CM

## 2021-12-27 DIAGNOSIS — O09899 Supervision of other high risk pregnancies, unspecified trimester: Secondary | ICD-10-CM

## 2021-12-27 DIAGNOSIS — F39 Unspecified mood [affective] disorder: Secondary | ICD-10-CM

## 2021-12-27 NOTE — Progress Notes (Signed)
   PRENATAL VISIT NOTE  Subjective:  Stephanie Frazier is a 21 y.o. G2P1001 at 36w3dbeing seen today for ongoing prenatal care.  She is currently monitored for the following issues for this high-risk pregnancy and has ADHD (attention deficit hyperactivity disorder); Migraine without aura and with status migrainosus, not intractable; Episodic tension-type headache, not intractable; Mood disorder ([place]; Insomnia; Flank pain, acute; Nephrolithiasis; Severe recurrent major depression with psychotic features (HEast [place]; Supervision of other high risk pregnancy, antepartum, unspecified trimester; and History of gestational hypertension on their problem list.  Patient reports no complaints.  Contractions: Irritability. Vag. Bleeding: None.  Movement: Present. Denies leaking of fluid.   The following portions of the patient's history were reviewed and updated as appropriate: allergies, current medications, past family history, past medical history, past social history, past surgical history and problem list.   Objective:   Vitals:   12/27/21 0813  BP: 134/79  Pulse: 83  Weight: 218 lb (98.9 kg)    Fetal Status: Fetal Heart Rate (bpm): 151   Movement: Present     General:  Alert, oriented and cooperative. Patient is in no acute distress.  Skin: Skin is warm and dry. No rash noted.   Cardiovascular: Normal heart rate noted  Respiratory: Normal respiratory effort, no problems with respiration noted  Abdomen: Soft, gravid, appropriate for gestational age.  Pain/Pressure: Present     Pelvic: Cervical exam deferred        Extremities: Normal range of motion.  Edema: None  Mental Status: Normal mood and affect. Normal behavior. Normal judgment and thought content.   Assessment and Plan:  Pregnancy: G2P1001 at 273w3d. [redacted] weeks gestation of pregnancy - CBC - Glucose Tolerance, 2 Hours w/1 Hour - HIV Antibody (routine testing w rflx) - RPR - Tdap vaccine greater than or equal to 7yo IM  2.  Supervision of other high risk pregnancy, antepartum, unspecified trimester FHT and FH normal  3. History of gestational hypertension BP normal ASA '81mg'$  daily  4. Mood disorder ([place]   Preterm labor symptoms and general obstetric precautions including but not limited to vaginal bleeding, contractions, leaking of fluid and fetal movement were reviewed in detail with the patient. Please refer to After Visit Summary for other counseling recommendations.   No follow-ups on file.  Future Appointments  Date Time Provider [place]6/22/2023  4:10 PM StTruett MainlandDO [facility] None  02/01/2022  4:20 PM PeFreada BergeronMD [facility] None    JaTruett MainlandDO

## 2021-12-28 LAB — HIV ANTIBODY (ROUTINE TESTING W REFLEX): HIV Screen 4th Generation wRfx: NONREACTIVE

## 2021-12-28 LAB — CBC
Hematocrit: 35 % (ref 34.0–46.6)
Hemoglobin: 12 g/dL (ref 11.1–15.9)
MCH: 31.6 pg (ref 26.6–33.0)
MCHC: 34.3 g/dL (ref 31.5–35.7)
MCV: 92 fL (ref 79–97)
Platelets: 197 10*3/uL (ref 150–450)
RBC: 3.8 x10E6/uL (ref 3.77–5.28)
RDW: 13.7 % (ref 11.7–15.4)
WBC: 9.9 10*3/uL (ref 3.4–10.8)

## 2021-12-28 LAB — GLUCOSE TOLERANCE, 2 HOURS W/ 1HR
Glucose, 1 hour: 130 mg/dL (ref 70–179)
Glucose, 2 hour: 127 mg/dL (ref 70–152)
Glucose, Fasting: 76 mg/dL (ref 70–91)

## 2021-12-28 LAB — RPR: RPR Ser Ql: NONREACTIVE

## 2022-01-10 ENCOUNTER — Ambulatory Visit (INDEPENDENT_AMBULATORY_CARE_PROVIDER_SITE_OTHER): Payer: Medicaid Other | Admitting: Family Medicine

## 2022-01-10 VITALS — BP 122/81 | HR 105 | Wt 220.0 lb

## 2022-01-10 DIAGNOSIS — O09899 Supervision of other high risk pregnancies, unspecified trimester: Secondary | ICD-10-CM

## 2022-01-10 DIAGNOSIS — Z8759 Personal history of other complications of pregnancy, childbirth and the puerperium: Secondary | ICD-10-CM

## 2022-01-10 DIAGNOSIS — O26893 Other specified pregnancy related conditions, third trimester: Secondary | ICD-10-CM

## 2022-01-10 DIAGNOSIS — R102 Pelvic and perineal pain: Secondary | ICD-10-CM

## 2022-01-10 NOTE — Progress Notes (Signed)
   PRENATAL VISIT NOTE  Subjective:  Stephanie Frazier is a 21 y.o. G2P1001 at 76w3dbeing seen today for ongoing prenatal care.  She is currently monitored for the following issues for this low-risk pregnancy and has ADHD (attention deficit hyperactivity disorder); Migraine without aura and with status migrainosus, not intractable; Episodic tension-type headache, not intractable; Mood disorder ([place]; Insomnia; Flank pain, acute; Nephrolithiasis; Severe recurrent major depression with psychotic features ([place]elt[place]; Supervision of other high risk pregnancy, antepartum, unspecified trimester; and History of gestational hypertension on their problem list.  Patient reports  pelvic pain and round ligament pain. Pelvic pain goes onto thighs, sometimes down left leg, worse with movement .  Contractions: Irritability. Vag. Bleeding: None.  Movement: Present. Denies leaking of fluid.   The following portions of the patient's history were reviewed and updated as appropriate: allergies, current medications, past family history, past medical history, past social history, past surgical history and problem list.   Objective:   Vitals:   01/10/22 1556  BP: 122/81  Pulse: (!) 105  Weight: 220 lb (99.8 kg)    Fetal Status: Fetal Heart Rate (bpm): 155   Movement: Present     General:  Alert, oriented and cooperative. Patient is in no acute distress.  Skin: Skin is warm and dry. No rash noted.   Cardiovascular: Normal heart rate noted  Respiratory: Normal respiratory effort, no problems with respiration noted  Abdomen: Soft, gravid, appropriate for gestational age.  Pain/Pressure: Present     Pelvic: Cervical exam deferred        Extremities: Normal range of motion.  Edema: None. Tenderness to psoas and pelvic girdle.   Mental Status: Normal mood and affect. Normal behavior. Normal judgment and thought content.   Assessment and Plan:  Pregnancy: G2P1001 at 299w3d. Supervision of other high risk pregnancy,  antepartum, unspecified trimester FHT and FH normal  2. History of gestational hypertension ASA '81mg'$   3. Pelvic pain affecting pregnancy in third trimester, antepartum Will refer to PT. - Ambulatory referral to Physical Therapy  Preterm labor symptoms and general obstetric precautions including but not limited to vaginal bleeding, contractions, leaking of fluid and fetal movement were reviewed in detail with the patient. Please refer to After Visit Summary for other counseling recommendations.   No follow-ups on file.  Future Appointments  Date Time Provider De[place]7/13/2023  4:15 PM ArWoodroe ModeMD [facility]7/14/2023  4:20 PM PeFreada BergeronMD [facility] None  02/14/2022  3:15 PM BaGriffin BasilMD [facility]8/04/2022  3:15 PM PrDonnamae JudeMD [facility]8/17/2023 11:15 AM ErChancy MilroyMD [facility]8/24/2023  3:35 PM PrDonnamae JudeMD WMSanta [facility]-SotoyomeMOak Trail ShoresDO

## 2022-01-28 ENCOUNTER — Encounter (HOSPITAL_COMMUNITY): Payer: Self-pay | Admitting: Obstetrics and Gynecology

## 2022-01-28 ENCOUNTER — Encounter: Payer: Self-pay | Admitting: Obstetrics & Gynecology

## 2022-01-28 ENCOUNTER — Telehealth: Payer: Self-pay | Admitting: *Deleted

## 2022-01-28 ENCOUNTER — Inpatient Hospital Stay (HOSPITAL_COMMUNITY)
Admission: RE | Admit: 2022-01-28 | Discharge: 2022-01-28 | Disposition: A | Discharge status: Home / Self Care | Payer: Medicaid Other | Attending: Family Medicine | Admitting: Family Medicine

## 2022-01-28 ENCOUNTER — Other Ambulatory Visit: Payer: Self-pay

## 2022-01-28 DIAGNOSIS — O26893 Other specified pregnancy related conditions, third trimester: Secondary | ICD-10-CM | POA: Diagnosis not present

## 2022-01-28 DIAGNOSIS — O09899 Supervision of other high risk pregnancies, unspecified trimester: Secondary | ICD-10-CM

## 2022-01-28 DIAGNOSIS — O09293 Supervision of pregnancy with other poor reproductive or obstetric history, third trimester: Secondary | ICD-10-CM | POA: Diagnosis not present

## 2022-01-28 DIAGNOSIS — Z7982 Long term (current) use of aspirin: Secondary | ICD-10-CM | POA: Insufficient documentation

## 2022-01-28 DIAGNOSIS — Z3A32 32 weeks gestation of pregnancy: Secondary | ICD-10-CM | POA: Diagnosis not present

## 2022-01-28 DIAGNOSIS — R519 Headache, unspecified: Secondary | ICD-10-CM | POA: Diagnosis not present

## 2022-01-28 DIAGNOSIS — O09893 Supervision of other high risk pregnancies, third trimester: Secondary | ICD-10-CM | POA: Diagnosis not present

## 2022-01-28 DIAGNOSIS — O133 Gestational [pregnancy-induced] hypertension without significant proteinuria, third trimester: Secondary | ICD-10-CM | POA: Diagnosis not present

## 2022-01-28 DIAGNOSIS — Z8759 Personal history of other complications of pregnancy, childbirth and the puerperium: Secondary | ICD-10-CM

## 2022-01-28 LAB — CBC
HCT: 34.9 % — ABNORMAL LOW (ref 36.0–46.0)
Hemoglobin: 11.8 g/dL — ABNORMAL LOW (ref 12.0–15.0)
MCH: 30.9 pg (ref 26.0–34.0)
MCHC: 33.8 g/dL (ref 30.0–36.0)
MCV: 91.4 fL (ref 80.0–100.0)
Platelets: 189 10*3/uL (ref 150–400)
RBC: 3.82 MIL/uL — ABNORMAL LOW (ref 3.87–5.11)
RDW: 13.4 % (ref 11.5–15.5)
WBC: 10.2 10*3/uL (ref 4.0–10.5)
nRBC: 0 % (ref 0.0–0.2)

## 2022-01-28 LAB — URINALYSIS, ROUTINE W REFLEX MICROSCOPIC
Bilirubin Urine: NEGATIVE
Glucose, UA: NEGATIVE mg/dL
Hgb urine dipstick: NEGATIVE
Ketones, ur: NEGATIVE mg/dL
Leukocytes,Ua: NEGATIVE
Nitrite: NEGATIVE
Protein, ur: NEGATIVE mg/dL
Specific Gravity, Urine: 1.011 (ref 1.005–1.030)
pH: 7 (ref 5.0–8.0)

## 2022-01-28 LAB — COMPREHENSIVE METABOLIC PANEL
ALT: 18 U/L (ref 0–44)
AST: 16 U/L (ref 15–41)
Albumin: 2.8 g/dL — ABNORMAL LOW (ref 3.5–5.0)
Alkaline Phosphatase: 95 U/L (ref 38–126)
Anion gap: 11 (ref 5–15)
BUN: 10 mg/dL (ref 6–20)
CO2: 22 mmol/L (ref 22–32)
Calcium: 9.1 mg/dL (ref 8.9–10.3)
Chloride: 103 mmol/L (ref 98–111)
Creatinine, Ser: 0.61 mg/dL (ref 0.44–1.00)
GFR, Estimated: >60 mL/min (ref >60)
Glucose, Bld: 81 mg/dL (ref 70–99)
Potassium: 3.9 mmol/L (ref 3.5–5.1)
Sodium: 136 mmol/L (ref 135–145)
Total Bilirubin: 0.3 mg/dL (ref 0.3–1.2)
Total Protein: 6.5 g/dL (ref 6.5–8.1)

## 2022-01-28 LAB — PROTEIN / CREATININE RATIO, URINE
Creatinine, Urine: 50.05 mg/dL
Total Protein, Urine: <6 mg/dL

## 2022-01-28 MED ORDER — ACETAMINOPHEN 500 MG PO TABS
1000.0000 mg | ORAL_TABLET | Freq: Once | ORAL | Status: AC
  Administered 2022-01-28: 1000 mg via ORAL
  Filled 2022-01-28: qty 2

## 2022-01-28 NOTE — MAU Provider Note (Addendum)
History     CSN: [id number]  Arrival date and time: 01/28/22 1710   None     Chief Complaint  Patient presents with   Headache   BP Evaluation   21 year old G2P1001 [redacted]w[redacted]d(LMP) presents to the MAU for headache, peripheral edema and hypertension.  She took her blood pressure at home 148/80.  She is taking ASA daily.  She reports having 1 strong contraction last night but has not had a reoccurrence.  She denies blurry vision, floaters.  Previous history of migraines.  She has tried Tylenol, Flexeril, and Imitrex at home with no relief of headaches.  Past medical history includes previous pregnancy with gestational hypertension, ADHD, mood disorder  Headache  This is a recurrent problem. The current episode started today. The problem occurs constantly. The problem has been unchanged. The pain does not radiate. The pain quality is similar to prior headaches. The pain is moderate. Pertinent negatives include no blurred vision, eye pain, loss of balance, nausea or seizures. She has tried acetaminophen for the symptoms. The treatment provided no relief. Her past medical history is significant for migraine headaches.    OB History     Gravida  2   Para  1   Term  1   Preterm      AB      Living  1      SAB      IAB      Ectopic      Multiple  0   Live Births  1           Past Medical History:  Diagnosis Date   Allergy    Anxiety    Depression    Hypertension    Hypertension    Migraine    Migraines    Movement disorder    Seizures ([place]    last seizure in 2009    Past Surgical History:  Procedure Laterality Date   BUNIONECTOMY Right 07/23/2016   BUNIONECTOMY Left 02/2016   FOOT SURGERY     WISDOM TOOTH EXTRACTION      Family History  Problem Relation Age of Onset   Seizures Mother    Depression Mother    ADD / ADHD Brother    Depression Brother    Migraines Maternal Grandmother    Depression Maternal Grandmother    Migraines Maternal Aunt     Depression Maternal Aunt    Migraines Maternal Uncle    ADD / ADHD Cousin        Many Maternal 1st Cousins [place]   Social History   Tobacco Use   Smoking status: Never    Passive exposure: Yes   Smokeless tobacco: Never   Tobacco comments:    Parents smoke outside  Vaping Use   Vaping Use: Never used  Substance Use Topics   Alcohol use: No    Comment: not while preg   Drug use: No    Allergies:  Allergies  Allergen Reactions   Morphine And Related Shortness Of Breath and Other (See Comments)    Headache and "trouble breathing"   Ondansetron Hcl Rash   Zofran Rash   Ketorolac Rash    Medications Prior to Admission  Medication Sig Dispense Refill Last Dose   aspirin EC 81 MG tablet Take 1 tablet (81 mg total) by mouth daily. Take after 12 weeks for prevention of preeclampsia later in pregnancy 300 tablet 2 01/28/2022   Prenatal Vit-Fe Fumarate-FA (PRENATAL VITAMINS PO) Take  by mouth.   01/28/2022   cyclobenzaprine (FLEXERIL) 10 MG tablet Take 1 tablet (10 mg total) by mouth 3 (three) times daily as needed (headache). 30 tablet 2 Unknown    Review of Systems  Eyes:  Negative for blurred vision and pain.  Cardiovascular:  Positive for leg swelling.  Gastrointestinal:  Negative for nausea.  Neurological:  Positive for headaches. Negative for seizures and loss of balance.   Physical Exam   Blood pressure 134/80, pulse 91, temperature 98.4 F (36.9 C), temperature source Oral, resp. rate 18, height '5\' 7"'$  (1.702 m), weight 99.4 kg, last menstrual period 06/18/2021, SpO2 97 %, currently breastfeeding.  Physical Exam Constitutional:      General: She is not in acute distress.    Appearance: She is well-developed. She is not ill-appearing.  Eyes:     General: No visual field deficit. Cardiovascular:     Rate and Rhythm: Normal rate and regular rhythm.     Heart sounds: Normal heart sounds. No murmur heard. Pulmonary:     Effort: Pulmonary effort is normal.   Musculoskeletal:        General: No swelling.     Right ankle: Normal. No swelling.     Left ankle: Normal. No swelling.  Skin:    General: Skin is warm and dry.  Neurological:     Mental Status: She is alert and oriented to person, place, and time. Mental status is at baseline.     NST:  Baseline: 150  Variability: moderate Accelerations: present  Decelerations: none Contractions: none    MAU Course  Procedures  MDM 21 year old G2 P1001 [redacted]w[redacted]d(dated off LMP) here in the MAU for blood pressure evaluation.  She has had a headache on and off for several months. She has a history of migraines. This morning she had 1 sharp contraction that has not reoccurred.  Blood pressure presentation is 146/75. NST is cat 1, baseline 150, moderate they are ability, positive accelerations and no decelerations.  CBC WNL, CMP WNL, urine protein creatinine ratio WNL.  Patient reports only taking over-the-counter medication for headaches 2-3 times a week, unlikely to be medication overuse migraines. Headache was improved with Tylenol at the MAU.  Assessment and Plan   1. Supervision of other high risk pregnancy, antepartum, unspecified trimester  2. History of gestational hypertension  3. Gestational hypertension without significant proteinuria in third trimester  4. Acute intractable headache, unspecified headache type    Gestational hypertension: Blood pressure stable, less than 1355systolic.  Patient instructed to check blood pressure at home if she has a headache.  Continue ASA.  Headache: Continue OTC Tylenol at home.  Return precautions include systolic blood pressure greater than 160 with headache, vaginal bleeding, fever greater then 100.4, SROM, strong contractions with pattern.  Follow-up in office 7/13, NST in office  EDarci Current7/04/2022, 7:38 PM     Attestation of Attending Supervision of Resident: Evaluation and management procedures were performed by the Resident under  my supervision and collaboration.  I have seen and examined the patient.  I agree with the Resident's note and Assessment and Plan.  Patient well-known to me.  She is 21year old G2 P1-0-0-1 at 32 weeks 0 days.  She presents with headache that started earlier today and was initially 7 out of 10.  Her blood pressure was elevated in the 130s 140s over 80s to 90s.  She headache reduced to 5 out of 10.  Medical office who instructed her to come to the  MAU for evaluation 21 year old.  Elevated blood pressures.  Patient Vitals for the past 24 hrs:  BP Temp Temp src Pulse Resp SpO2 Height Weight  01/28/22 1920 -- -- -- -- -- 97 % -- --  01/28/22 1916 134/80 -- -- 91 -- -- -- --  01/28/22 1915 -- -- -- -- -- 98 % -- --  01/28/22 1910 -- -- -- -- -- 97 % -- --  01/28/22 1905 -- -- -- -- -- 97 % -- --  01/28/22 1901 (!) 141/82 -- -- 93 -- -- -- --  01/28/22 1900 -- -- -- -- -- 98 % -- --  01/28/22 1855 -- -- -- -- -- 97 % -- --  01/28/22 1850 -- -- -- -- -- 97 % -- --  01/28/22 1846 127/79 -- -- 97 -- -- -- --  01/28/22 1845 -- -- -- -- -- 97 % -- --  01/28/22 1840 -- -- -- -- -- 97 % -- --  01/28/22 1835 -- -- -- -- -- 97 % -- --  01/28/22 1831 134/78 -- -- 93 -- -- -- --  01/28/22 1830 -- -- -- -- -- 97 % -- --  01/28/22 1825 -- -- -- -- -- 98 % -- --  01/28/22 1820 -- -- -- -- -- 97 % -- --  01/28/22 1816 (!) 144/89 -- -- (!) 101 -- -- -- --  01/28/22 1815 -- -- -- -- -- 98 % -- --  01/28/22 1810 -- -- -- -- -- 98 % -- --  01/28/22 1805 -- -- -- -- -- 97 % -- --  01/28/22 1804 (!) 138/92 -- -- (!) 101 -- -- -- --  01/28/22 1744 (!) 146/75 98.4 F (36.9 C) Oral 95 18 96 % -- --  01/28/22 1736 -- -- -- -- -- -- '5\' 7"'$  (1.702 m) 99.4 kg   A&Ox3, NAD No abdominal pain. Edema 1+. Pulses 2+  Results for orders placed or performed during the hospital encounter of 01/28/22 (from the past 24 hour(s))  Urinalysis, Routine w reflex microscopic Urine, Clean Catch     Status: None   Collection  Time: 01/28/22  6:15 PM  Result Value Ref Range   Color, Urine YELLOW YELLOW   APPearance CLEAR CLEAR   Specific Gravity, Urine 1.011 1.005 - 1.030   pH 7.0 5.0 - 8.0   Glucose, UA NEGATIVE NEGATIVE mg/dL   Hgb urine dipstick NEGATIVE NEGATIVE   Bilirubin Urine NEGATIVE NEGATIVE   Ketones, ur NEGATIVE NEGATIVE mg/dL   Protein, ur NEGATIVE NEGATIVE mg/dL   Nitrite NEGATIVE NEGATIVE   Leukocytes,Ua NEGATIVE NEGATIVE  Protein / creatinine ratio, urine     Status: None   Collection Time: 01/28/22  6:15 PM  Result Value Ref Range   Creatinine, Urine 50.05 mg/dL   Total Protein, Urine <6 mg/dL   Protein Creatinine Ratio        0.00 - 0.15 mg/mg[Cre]  CBC     Status: Abnormal   Collection Time: 01/28/22  6:35 PM  Result Value Ref Range   WBC 10.2 4.0 - 10.5 K/uL   RBC 3.82 (L) 3.87 - 5.11 MIL/uL   Hemoglobin 11.8 (L) 12.0 - 15.0 g/dL   HCT 34.9 (L) 36.0 - 46.0 %   MCV 91.4 80.0 - 100.0 fL   MCH 30.9 26.0 - 34.0 pg   MCHC 33.8 30.0 - 36.0 g/dL   RDW 13.4 11.5 - 15.5 %   Platelets 189 150 - 400 K/uL  nRBC 0.0 0.0 - 0.2 %  Comprehensive metabolic panel     Status: Abnormal   Collection Time: 01/28/22  6:35 PM  Result Value Ref Range   Sodium 136 135 - 145 mmol/L   Potassium 3.9 3.5 - 5.1 mmol/L   Chloride 103 98 - 111 mmol/L   CO2 22 22 - 32 mmol/L   Glucose, Bld 81 70 - 99 mg/dL   BUN 10 6 - 20 mg/dL   Creatinine, Ser 0.61 0.44 - 1.00 mg/dL   Calcium 9.1 8.9 - 10.3 mg/dL   Total Protein 6.5 6.5 - 8.1 g/dL   Albumin 2.8 (L) 3.5 - 5.0 g/dL   AST 16 15 - 41 U/L   ALT 18 0 - 44 U/L   Alkaline Phosphatase 95 38 - 126 U/L   Total Bilirubin 0.3 0.3 - 1.2 mg/dL   GFR, Estimated >60 >60 mL/min   Anion gap 11 5 - 15   1. Gestational hypertension without significant proteinuria in third trimester   2. Supervision of other high risk pregnancy, antepartum, unspecified trimester   3. History of gestational hypertension   4. Acute intractable headache, unspecified headache type   5.  [redacted] weeks gestation of pregnancy    Pain improved.  Discharge to home. Severe symptoms reviewed.  NST on Thursday at appt.  Loma Boston, DO Attending [facility], [place]

## 2022-01-28 NOTE — Telephone Encounter (Signed)
I called patient to follow up on her MyChart message. She states the pain she had this am was similar to a contraction and she has not felt it again. She reports the baby is moving well. She does states she has been having headaches and still has one that eased a little since she laid down, but was a strong headache.  She does report edema in her feet and states they are puffy and leave a mark if you press them.  She recheck blood pressure at my request and it was 148/80.  She states she has been noticing her blood pressure has been creeping up. I advised her to go to MAU for evaluation for pre-eclampsia asap today. She voices understanding.  Staci Acosta

## 2022-01-28 NOTE — MAU Note (Addendum)
Stephanie Frazier is a 21 y.o. at 57w0dhere in MAU reporting: she's here for BP evaluation.  Endorses current H/A, hasn't taken any meds.  Denies visual disturbances, reports had blurred vision last night.  Denies current epigastric pain, but states had intermittent throbbing pain 2 hours ago.   Also reports has one sharp ctx this morning upon waking and has since had BSL[employer] Corporationctxs.  Denies VB or LOF.  Endorses +FM. LMP: N/A Onset of complaint: today Pain score: 5/10 Vitals:   01/28/22 1744  BP: (!) 146/75  Pulse: 95  Resp: 18  Temp: 98.4 F (36.9 C)  SpO2: 96%     FHT:177 bpm , movement audible Lab orders placed from triage:   UA

## 2022-01-30 NOTE — Progress Notes (Signed)
Cardio-Obstetrics Clinic  Follow-up Evaluation  Date:  02/01/2022   ID:  Stephanie Frazier, DOB 15-Jun-2001, MRN [id number]  PCP:  No primary care provider on file.   [facility] Providers Cardiologist:  None  Electrophysiologist:  None       Referring MD: Stephanie Mccreedy, MD   Chief Complaint: Palpitations  History of Present Illness:    Stephanie Frazier is a 21 y.o. female [G2P1001] who presents to clinic for follow-up for palpitations.  Patient has history of anxiety, depression, and gestational HTN.   During prior visit, the patient was seen for palpitations that had been ongoing for years. Zio monitor 11/2021 which showed NSR, one 5 beat run of NSVT, rare SVE (<1%), rare VE (<1%). No arrhythmias. TTE 12/2021 with LVEF 60-65%, GLS -21.6%, normal RV, mild LAE, no significant valve disease.   Today, the patient is currently [redacted] weeks pregnant. She states she continues to have intermittent episodes of palpitations but they are overall improved. Dizziness has persisted but no LOC.   She is more concerned about her blood pressure as it has been rising and running mainly 130-140s at home. Was recently seen in the MAU and fortunately ruled out for pre-eclampsia but there is concern that she may develop it later on. She has been taking ASA '81mg'$  daily. No chest pain, SOB, orthopnea. Has occasional ankle swelling. Continues to have pre-syncopal symptoms but no LOC.   Prior CV Studies Reviewed: The following studies were reviewed today: Zio monitor 11/2021: Patch wear time was 6 days and 21 hours Predominant rhythm is sinus with average heart rate of 96bpm One brief run of nonsustained VT lastinc 5 beats Rare SVE (<1%), rare VE (<1%) Patient triggered events correlate mainly with sinus tachycardia No sustained arrhythmias or significant pauses.     Patch Wear Time:  6 days and 21 hours (2023-05-15T16:04:24-0400 to 2023-05-22T14:04:09-398)   Patient had a min HR of 60 bpm, max  HR of 200 bpm, and avg HR of 96 bpm. Predominant underlying rhythm was Sinus Rhythm. Slight P wave morphology changes were noted. 1 run of Ventricular Tachycardia occurred lasting 5 beats with a max rate of 200 bpm  (avg 158 bpm). Isolated SVEs were rare (<1.0%), and no SVE Couplets or SVE Triplets were present. Isolated VEs were rare (<1.0%), VE Couplets were rare (<1.0%), and no VE Triplets were present.  TTE 12/2021: IMPRESSIONS     1. Left ventricular ejection fraction, by estimation, is 60 to 65%. Left  ventricular ejection fraction by 3D volume is 64 %. The left ventricle has  normal function. The left ventricle has no regional wall motion  abnormalities. Left ventricular diastolic   parameters were normal. The average left ventricular global longitudinal  strain is 21.6 %. The global longitudinal strain is normal.   2. Right ventricular systolic function is normal. The right ventricular  size is mildly enlarged. There is normal pulmonary artery systolic  pressure.   3. Left atrial size was mildly dilated.   4. The mitral valve is normal in structure. Trivial mitral valve  regurgitation. No evidence of mitral stenosis.   5. The aortic valve is normal in structure. Aortic valve regurgitation is  trivial. No aortic stenosis is present.   6. The inferior vena cava is normal in size with greater than 50%  respiratory variability, suggesting right atrial pressure of 3 mmHg.   Past Medical History:  Diagnosis Date   Allergy    Anxiety    Depression  Hypertension    Hypertension    Migraine    Migraines    Movement disorder    Seizures ([place])    last seizure in 2009    Past Surgical History:  Procedure Laterality Date   BUNIONECTOMY Right 07/23/2016   BUNIONECTOMY Left 02/2016   FOOT SURGERY     WISDOM TOOTH EXTRACTION        OB History     Gravida  2   Para  1   Term  1   Preterm      AB      Living  1      SAB      IAB      Ectopic      Multiple   0   Live Births  1               Current Medications: Current Meds  Medication Sig   aspirin EC 81 MG tablet Take 1 tablet (81 mg total) by mouth daily. Take after 12 weeks for prevention of preeclampsia later in pregnancy   cyclobenzaprine (FLEXERIL) 10 MG tablet Take 1 tablet (10 mg total) by mouth 3 (three) times daily as needed (headache).   Prenatal Vit-Fe Fumarate-FA (PRENATAL VITAMINS PO) Take by mouth.     Allergies:   Morphine and related, Ondansetron hcl, Zofran, and Ketorolac   Social History   Socioeconomic History   Marital status: Soil scientist    Spouse name: Not on file   Number of children: Not on file   Years of education: Not on file   Highest education level: Not on file  Occupational History   Not on file  Tobacco Use   Smoking status: Never    Passive exposure: Yes   Smokeless tobacco: Never   Tobacco comments:    Parents smoke outside  Vaping Use   Vaping Use: Never used  Substance and Sexual Activity   Alcohol use: No    Comment: not while preg   Drug use: No   Sexual activity: Yes  Other Topics Concern   Not on file  Social History Narrative   Stephanie Frazier is a 12th grade student.   She attends [employer]    She lives with her mother and step-father.   She enjoys art and boxing.   Social Determinants of Health   Financial Resource Strain: Not on file  Food Insecurity: Not on file  Transportation Needs: Not on file  Physical Activity: Not on file  Stress: Not on file  Social Connections: Not on file      Family History  Problem Relation Age of Onset   Seizures Mother    Depression Mother    ADD / ADHD Brother    Depression Brother    Migraines Maternal Grandmother    Depression Maternal Grandmother    Migraines Maternal Aunt    Depression Maternal Aunt    Migraines Maternal Uncle    ADD / ADHD Cousin        Many Maternal 1st Cousins have [place]      ROS:   Please see the history of present illness.    Denies:  vision changes, headaches  All other systems reviewed and are negative.   Labs/EKG Reviewed:    EKG:   EKG ordered today.  The ekg ordered today demonstrates sinus tachycardia at 103 BMP, largely unchanged from prior ECGs.  Recent Labs: 01/28/2022: ALT 18; BUN 10; Creatinine, Ser 0.61; Hemoglobin 11.8; Platelets 189; Potassium  3.9; Sodium 136   Recent Lipid Panel Lab Results  Component Value Date/Time   CHOL 118 08/06/2017 06:28 AM   TRIG 91 08/06/2017 06:28 AM   HDL 37 (L) 08/06/2017 06:28 AM   CHOLHDL 3.2 08/06/2017 06:28 AM   LDLCALC 63 08/06/2017 06:28 AM    Physical Exam:    VS:  BP 136/78   Pulse (!) 101   Ht '5\' 7"'$  (1.702 m)   Wt 220 lb 14.4 oz (100.2 kg)   LMP 06/18/2021   SpO2 97%   BMI 34.60 kg/m     Wt Readings from Last 3 Encounters:  02/01/22 220 lb 14.4 oz (100.2 kg)  01/31/22 217 lb 6.4 oz (98.6 kg)  01/28/22 219 lb 3.2 oz (99.4 kg)     GEN:  Comfortable, NAD HEENT: Normal NECK: No JVD; No carotid bruits CARDIAC: RR, 2/6 systolic murmur LUSB RESPIRATORY:  CTAB ABDOMEN: Gravid MUSCULOSKELETAL:  No edema; No deformity  SKIN: Warm and dry NEUROLOGIC:  Alert and oriented x 3 PSYCHIATRIC:  Normal affect      ASSESSMENT & PLAN:    #Palpitations: Reassuring work-up. Zio monitor with NSR, rare SVE, rare VE, no arrhythmias. TTE with LVEF 60-65%, mild LAE, no significant valve disease. Likely will improve post-partum.  #History of Gestational HTN: #Elevated blood pressure: Higher risk of pre-eclampsia. Blood pressure has been running higher at 130-140s. Will need very close monitoring going forward. Will arrange for Pharm D follow-up in 2 weeks. -Continue ASA '81mg'$  daily -Arrange for Pharm D visit in 2 weeks for blood pressure check -Discussed that if BP persistently >140/90s to please contact our office  #Murmur, Hx of abnormal echo TTE with LVEF 60-65%, no significant valve disease.   #Lightheadedness: #Orthostasis of pregnancy: Had episodes  with prior pregnancy and continues to have lightheadedness with position changes and prolonged standing with current pregnancy. No episodes of syncope recently. Has been trying to remain hydrated. States that electrolyte drinks and compression socks do not work for her. Will continue with conservative management.  Patient Instructions  Medication Instructions:   Your physician recommends that you continue on your current medications as directed. Please refer to the Current Medication list given to you today.  *If you need a refill on your cardiac medications before your next appointment, please call your pharmacy*    Follow-Up:  1.) IN 2 WEEKS WITH CHRIS PAVERO PHARMACIST IN [facility] CLINIC  2.)  4 MONTHS WITH DR. Johney Frame HERE AT [facility]         Dispo:  No follow-ups on file.   Medication Adjustments/Labs and Tests Ordered: Current medicines are reviewed at length with the patient today.  Concerns regarding medicines are outlined above.  Tests Ordered: Orders Placed This Encounter  Procedures   AMB Referral to [facility] Pharm-D   Medication Changes: No orders of the defined types were placed in this encounter.

## 2022-01-31 ENCOUNTER — Telehealth: Payer: Self-pay | Admitting: Family Medicine

## 2022-01-31 ENCOUNTER — Encounter: Payer: Medicaid Other | Admitting: Obstetrics & Gynecology

## 2022-01-31 ENCOUNTER — Ambulatory Visit (INDEPENDENT_AMBULATORY_CARE_PROVIDER_SITE_OTHER): Payer: Medicaid Other | Admitting: Obstetrics & Gynecology

## 2022-01-31 ENCOUNTER — Other Ambulatory Visit: Payer: Self-pay

## 2022-01-31 VITALS — BP 130/78 | HR 101 | Wt 217.4 lb

## 2022-01-31 DIAGNOSIS — O09899 Supervision of other high risk pregnancies, unspecified trimester: Secondary | ICD-10-CM

## 2022-01-31 DIAGNOSIS — O09893 Supervision of other high risk pregnancies, third trimester: Secondary | ICD-10-CM

## 2022-01-31 DIAGNOSIS — Z3A32 32 weeks gestation of pregnancy: Secondary | ICD-10-CM

## 2022-01-31 DIAGNOSIS — Z8759 Personal history of other complications of pregnancy, childbirth and the puerperium: Secondary | ICD-10-CM

## 2022-01-31 NOTE — Progress Notes (Signed)
   PRENATAL VISIT NOTE  Subjective:  Stephanie Frazier is a 21 y.o. G2P1001 at 24w3dbeing seen today for ongoing prenatal care.  She is currently monitored for the following issues for this high-risk pregnancy and has ADHD (attention deficit hyperactivity disorder); Migraine without aura and with status migrainosus, not intractable; Episodic tension-type headache, not intractable; Mood disorder ([place]; Insomnia; Flank pain, acute; Nephrolithiasis; Severe recurrent major depression with psychotic features ([place]; Supervision of other high risk pregnancy, antepartum, unspecified trimester; and History of gestational hypertension on their problem list.  She was seen in the MAU on 7/10 for evaluation of high blood pressure with a headache. Preeclampsia was ruled out and her elevated blood pressure was determined to be gestational hypertension.   Patient reports some back and side aches along with abdominal pain and pressure. Vag. Bleeding: None.  Movement: Present. Denies leaking of fluid, but has some vaginal discharge  The following portions of the patient's history were reviewed and updated as appropriate: allergies, current medications, past family history, past medical history, past social history, past surgical history and problem list.   Objective:   Vitals:   01/31/22 1605  BP: 130/78  Pulse: (!) 101  Weight: 217 lb 6.4 oz (98.6 kg)    Fetal Status: Fetal Heart Rate (bpm): 151   Movement: Present     General:  Alert, oriented and cooperative. Patient is in no acute distress.  Skin: Skin is warm and dry. No rash noted.   Cardiovascular: Normal heart rate noted  Respiratory: Normal respiratory effort, no problems with respiration noted  Abdomen: Soft, gravid, appropriate for gestational age.     Pelvic: Cervical exam deferred        Extremities: Normal range of motion.  Edema: present  Mental Status: Normal mood and affect. Normal behavior. Normal judgment and thought content.    Assessment and Plan:  Pregnancy: G2P1001 at 339w3d. Supervision of other high risk pregnancy, antepartum, unspecified trimester - See back in 1 week  2. History of gestational hypertension - f/up U/S in 1 week  - BP 130/78 today - On ASA '81mg'$   Preterm labor symptoms and general obstetric precautions including but not limited to vaginal bleeding, contractions, leaking of fluid and fetal movement were reviewed in detail with the patient. Please refer to After Visit Summary for other counseling recommendations.   No follow-ups on file.  Future Appointments  Date Time Provider DeEugene7/14/2023  4:20 PM PeFreada BergeronMD [facility] None  02/14/2022  3:15 PM BaGriffin BasilMD [facility]8/04/2022  3:15 PM PrDonnamae JudeMD [facility]Wil[facility]ML[facility]8/17/2023 11:15 AM ErChancy MilroyMD WMSur[facility] LLCMD[facility]8/24/2023  3:35 PM PrDonnamae JudeMD WMRaulerson HospitalMNorth WarrenStudent-PA Attestation of Attending Supervision of PA Student: Evaluation and management procedures were performed by the PA student under my supervision and collaboration.  I have reviewed the student's note and chart, and I agree with the management and plan.  JAEmeterio ReeveMD, FAMays Lickttending ObCantonWoBrigham And Women'S Hospital

## 2022-01-31 NOTE — Telephone Encounter (Signed)
Patient refused 1 week appt around 7/20

## 2022-02-01 ENCOUNTER — Ambulatory Visit (INDEPENDENT_AMBULATORY_CARE_PROVIDER_SITE_OTHER): Payer: Medicaid Other | Admitting: Cardiology

## 2022-02-01 ENCOUNTER — Encounter: Payer: Self-pay | Admitting: Cardiology

## 2022-02-01 VITALS — BP 136/78 | HR 101 | Ht 67.0 in | Wt 220.9 lb

## 2022-02-01 DIAGNOSIS — R42 Dizziness and giddiness: Secondary | ICD-10-CM

## 2022-02-01 DIAGNOSIS — R002 Palpitations: Secondary | ICD-10-CM | POA: Diagnosis not present

## 2022-02-01 DIAGNOSIS — O09899 Supervision of other high risk pregnancies, unspecified trimester: Secondary | ICD-10-CM | POA: Diagnosis not present

## 2022-02-01 DIAGNOSIS — I10 Essential (primary) hypertension: Secondary | ICD-10-CM

## 2022-02-01 NOTE — Patient Instructions (Signed)
Medication Instructions:   Your physician recommends that you continue on your current medications as directed. Please refer to the Current Medication list given to you today.  *If you need a refill on your cardiac medications before your next appointment, please call your pharmacy*    Follow-Up:  1.) IN 2 WEEKS WITH CHRIS PAVERO PHARMACIST IN [facility]  2.)  4 MONTHS WITH DR. Johney Frame HERE AT [place]

## 2022-02-13 ENCOUNTER — Encounter: Payer: Self-pay | Admitting: *Deleted

## 2022-02-13 ENCOUNTER — Ambulatory Visit: Payer: Medicaid Other | Attending: Obstetrics & Gynecology

## 2022-02-13 ENCOUNTER — Ambulatory Visit: Payer: Medicaid Other | Admitting: *Deleted

## 2022-02-13 VITALS — BP 136/84 | HR 97

## 2022-02-13 DIAGNOSIS — Z8759 Personal history of other complications of pregnancy, childbirth and the puerperium: Secondary | ICD-10-CM | POA: Diagnosis present

## 2022-02-13 DIAGNOSIS — O09899 Supervision of other high risk pregnancies, unspecified trimester: Secondary | ICD-10-CM

## 2022-02-14 ENCOUNTER — Other Ambulatory Visit: Payer: Self-pay | Admitting: *Deleted

## 2022-02-14 ENCOUNTER — Other Ambulatory Visit: Payer: Self-pay

## 2022-02-14 ENCOUNTER — Ambulatory Visit (INDEPENDENT_AMBULATORY_CARE_PROVIDER_SITE_OTHER): Payer: Medicaid Other | Admitting: Obstetrics and Gynecology

## 2022-02-14 ENCOUNTER — Encounter: Payer: Medicaid Other | Admitting: Family Medicine

## 2022-02-14 VITALS — BP 137/83 | Wt 223.4 lb

## 2022-02-14 DIAGNOSIS — O133 Gestational [pregnancy-induced] hypertension without significant proteinuria, third trimester: Secondary | ICD-10-CM

## 2022-02-14 DIAGNOSIS — Z3A34 34 weeks gestation of pregnancy: Secondary | ICD-10-CM

## 2022-02-14 DIAGNOSIS — F419 Anxiety disorder, unspecified: Secondary | ICD-10-CM | POA: Insufficient documentation

## 2022-02-14 DIAGNOSIS — F410 Panic disorder [episodic paroxysmal anxiety] without agoraphobia: Secondary | ICD-10-CM | POA: Insufficient documentation

## 2022-02-14 DIAGNOSIS — Z87898 Personal history of other specified conditions: Secondary | ICD-10-CM | POA: Insufficient documentation

## 2022-02-14 DIAGNOSIS — G629 Polyneuropathy, unspecified: Secondary | ICD-10-CM | POA: Insufficient documentation

## 2022-02-14 DIAGNOSIS — O09899 Supervision of other high risk pregnancies, unspecified trimester: Secondary | ICD-10-CM

## 2022-02-14 DIAGNOSIS — Z8759 Personal history of other complications of pregnancy, childbirth and the puerperium: Secondary | ICD-10-CM

## 2022-02-14 HISTORY — DX: Personal history of other specified conditions: Z87.898

## 2022-02-14 LAB — POCT URINALYSIS DIP (DEVICE)
Bilirubin Urine: NEGATIVE
Glucose, UA: NEGATIVE mg/dL
Hgb urine dipstick: NEGATIVE
Ketones, ur: NEGATIVE mg/dL
Leukocytes,Ua: NEGATIVE
Nitrite: NEGATIVE
Protein, ur: NEGATIVE mg/dL
Specific Gravity, Urine: >=1.03 (ref 1.005–1.030)
Urobilinogen, UA: 0.2 mg/dL (ref 0.0–1.0)
pH: 6 (ref 5.0–8.0)

## 2022-02-14 NOTE — Progress Notes (Signed)
   PRENATAL VISIT NOTE  Subjective:  Stephanie Frazier is a 21 y.o. G2P1001 at 71w3dbeing seen today for ongoing prenatal care.  She is currently monitored for the following issues for this high-risk pregnancy and has ADHD (attention deficit hyperactivity disorder); Migraine without aura and with status migrainosus, not intractable; Episodic tension-type headache, not intractable; Mood disorder ([place]; Insomnia; Flank pain, acute; Nephrolithiasis; Severe recurrent major depression with psychotic features ([place]; Gestational hypertension; Supervision of other high risk pregnancy, antepartum, unspecified trimester; History of gestational hypertension; Anxiety; Neuropathy; and History of seizures on their problem list.  Patient doing well with no acute concerns today. She reports no complaints.  Contractions: Irritability. Vag. Bleeding: None.  Movement: Present. Denies leaking of fluid.   The following portions of the patient's history were reviewed and updated as appropriate: allergies, current medications, past family history, past medical history, past social history, past surgical history and problem list. Problem list updated.  Objective:   Vitals:   02/14/22 1518  BP: 137/83  Weight: 223 lb 6.4 oz (101.3 kg)    Fetal Status: Fetal Heart Rate (bpm): 142 Fundal Height: 34 cm Movement: Present     General:  Alert, oriented and cooperative. Patient is in no acute distress.  Skin: Skin is warm and dry. No rash noted.   Cardiovascular: Normal heart rate noted  Respiratory: Normal respiratory effort, no problems with respiration noted  Abdomen: Soft, gravid, appropriate for gestational age.  Pain/Pressure: Absent     Pelvic: Cervical exam deferred        Extremities: Normal range of motion.     Mental Status:  Normal mood and affect. Normal behavior. Normal judgment and thought content.   Assessment and Plan:  Pregnancy: G2P1001 at 364w3d1. Supervision of other high risk pregnancy,  antepartum, unspecified trimester Continue routine prenatal care Pt desires tubal ligation after delivery. MD expressed some trepidation due to patient's age.  Offered long term birth control (IUD, nexplanon) as alternative.  Pt declined.  She does understand the procedure will be considered permanent and there is significant risk of regret.  2. History of seizures Remote h/o seizures, none recent  3. [redacted] weeks gestation of pregnancy   4. History of gestational hypertension   5. Gestational hypertension, third trimester After extensive review of chart, pt does meet criteria of gestational hypertension.  Per MFM advise delivery at 37-38 weeks.  38 weeks appears more appropriate, schedule IOL at next visit  Preterm labor symptoms and general obstetric precautions including but not limited to vaginal bleeding, contractions, leaking of fluid and fetal movement were reviewed in detail with the patient.  Please refer to After Visit Summary for other counseling recommendations.   Return in about 2 weeks (around 02/28/2022) [facility], 36 weeks swabs.   LaLynnda ShieldsMD Faculty Attending Center for [facility]

## 2022-02-15 ENCOUNTER — Ambulatory Visit (INDEPENDENT_AMBULATORY_CARE_PROVIDER_SITE_OTHER): Payer: Medicaid Other | Admitting: Pharmacist

## 2022-02-15 ENCOUNTER — Encounter: Payer: Self-pay | Admitting: Pharmacist

## 2022-02-15 VITALS — BP 137/86 | HR 98 | Wt 226.8 lb

## 2022-02-15 DIAGNOSIS — O133 Gestational [pregnancy-induced] hypertension without significant proteinuria, third trimester: Secondary | ICD-10-CM | POA: Diagnosis not present

## 2022-02-15 NOTE — Progress Notes (Signed)
Patient ID: Stephanie Frazier                 DOB: 2000/11/28                      MRN: [id number]     HPI: Stephanie Frazier is a 21 y.o. female referred by Dr.Pemberton for HTN management. Patient is 34 weeks 4 days pregnant. Patient G2P1001. OB estimates induction around 37-38 weeks.  Patient went to ED on 7/10 for headache and elevated BP.  Seen by Dr Johney Frame on 7/14 for ongoing palpitations. Zio monitor results showed NSR and no valvular disease.  Blood pressure increases showed concern for preeclampsia. Is on aspirin '81mg'$ .  Patient presents today in good spirits. Reports occasional chest pain and has infrequent contractions.  Does not think chest pain is indigestion.    Still occasionally dizzy and lightheaded however not s frequent and has not had any further syncopal episodes. 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 Does not think it is related to standing up too quickly.  Is not corrected by Gatorade or support stockings.  Has occasional swelling in lower extremities but none noted today.  Checks BP daily. Most readings typically in 130s/70s.  Home readings: 134/75 148/80 140/86 133/79 137/75 148/74 105/69 111/66 136/77 145/91  Reports does not have much appetite right now. Mostly is craving cold drinks and ice.  Current HTN meds: N/A  Wt Readings from Last 3 Encounters:  02/14/22 223 lb 6.4 oz (101.3 kg)  02/01/22 220 lb 14.4 oz (100.2 kg)  01/31/22 217 lb 6.4 oz (98.6 kg)   BP Readings from Last 3 Encounters:  02/14/22 137/83  02/13/22 136/84  02/01/22 136/78   Pulse Readings from Last 3 Encounters:  02/13/22 97  02/01/22 (!) 101  01/31/22 (!) 101    Renal function: Estimated Creatinine Clearance: 136.1 mL/min (by C-G formula based on SCr of 0.61 mg/dL).  Past Medical History:  Diagnosis Date   Allergy    Anxiety    Depression    Hypertension    Hypertension    Migraine    Migraines    Movement disorder     Seizures ([place])    last seizure in 2009    Current Outpatient Medications on File Prior to Visit  Medication Sig Dispense Refill   aspirin EC 81 MG tablet Take 1 tablet (81 mg total) by mouth daily. Take after 12 weeks for prevention of preeclampsia later in pregnancy 300 tablet 2   cyclobenzaprine (FLEXERIL) 10 MG tablet Take 1 tablet (10 mg total) by mouth 3 (three) times daily as needed (headache). 30 tablet 2   Prenatal Vit-Fe Fumarate-FA (PRENATAL VITAMINS PO) Take by mouth.     No current facility-administered medications on file prior to visit.    Allergies  Allergen Reactions   Morphine And Related Shortness Of Breath and Other (See Comments)    Headache and "trouble breathing"   Ondansetron Hcl Rash   Zofran Rash   Ketorolac Rash     Assessment/Plan:  1. Hypertension -  Patient BP in room today 137/86 which is similar to home readings.  Advised to continue checking BP once daily or if she feels dizzy or lightheaded.  If blood pressure consistently greater than 140/90 to please reach out to myself or Dr Johney Frame via [place] or phone. No medications needed at this time.  Follow up as needed.  Karren Cobble, PharmD, BCACP, [place], [place], [place], [place], [address] Phone: [phone], Fax: [phone]

## 2022-02-15 NOTE — Patient Instructions (Addendum)
It was nice meeting you today  Your blood pressure today is 137/86  If you see it consistently rise greater than 140/90 please call us  You can send messages or pictures of your blood pressure readings as well  Karren Cobble, PharmD, [place], [place], [place], [place], [place], [address] Phone: [phone], Fax: [phone]

## 2022-02-19 ENCOUNTER — Encounter: Payer: Self-pay | Admitting: Family Medicine

## 2022-02-21 ENCOUNTER — Ambulatory Visit: Payer: Medicaid Other | Admitting: *Deleted

## 2022-02-21 ENCOUNTER — Ambulatory Visit: Payer: Medicaid Other | Attending: Obstetrics

## 2022-02-21 ENCOUNTER — Encounter: Payer: Self-pay | Admitting: Family Medicine

## 2022-02-21 VITALS — BP 133/68 | HR 92

## 2022-02-21 DIAGNOSIS — Z8759 Personal history of other complications of pregnancy, childbirth and the puerperium: Secondary | ICD-10-CM | POA: Diagnosis present

## 2022-02-21 DIAGNOSIS — O133 Gestational [pregnancy-induced] hypertension without significant proteinuria, third trimester: Secondary | ICD-10-CM | POA: Insufficient documentation

## 2022-02-21 DIAGNOSIS — O09899 Supervision of other high risk pregnancies, unspecified trimester: Secondary | ICD-10-CM | POA: Insufficient documentation

## 2022-02-24 ENCOUNTER — Encounter (HOSPITAL_COMMUNITY): Payer: Self-pay | Admitting: Family Medicine

## 2022-02-24 ENCOUNTER — Inpatient Hospital Stay (HOSPITAL_COMMUNITY)
Admission: AD | Admit: 2022-02-24 | Discharge: 2022-02-24 | Disposition: A | Discharge status: Home / Self Care | Payer: Medicaid Other | Attending: Family Medicine | Admitting: Family Medicine

## 2022-02-24 DIAGNOSIS — J029 Acute pharyngitis, unspecified: Secondary | ICD-10-CM | POA: Diagnosis not present

## 2022-02-24 DIAGNOSIS — Z3A35 35 weeks gestation of pregnancy: Secondary | ICD-10-CM | POA: Insufficient documentation

## 2022-02-24 DIAGNOSIS — O26893 Other specified pregnancy related conditions, third trimester: Secondary | ICD-10-CM | POA: Insufficient documentation

## 2022-02-24 DIAGNOSIS — O99513 Diseases of the respiratory system complicating pregnancy, third trimester: Secondary | ICD-10-CM | POA: Insufficient documentation

## 2022-02-24 DIAGNOSIS — Z3A36 36 weeks gestation of pregnancy: Secondary | ICD-10-CM | POA: Diagnosis not present

## 2022-02-24 DIAGNOSIS — O99891 Other specified diseases and conditions complicating pregnancy: Secondary | ICD-10-CM | POA: Diagnosis not present

## 2022-02-24 DIAGNOSIS — M549 Dorsalgia, unspecified: Secondary | ICD-10-CM | POA: Diagnosis not present

## 2022-02-24 LAB — URINALYSIS, ROUTINE W REFLEX MICROSCOPIC
Bilirubin Urine: NEGATIVE
Glucose, UA: NEGATIVE mg/dL
Hgb urine dipstick: NEGATIVE
Ketones, ur: NEGATIVE mg/dL
Leukocytes,Ua: NEGATIVE
Nitrite: NEGATIVE
Protein, ur: NEGATIVE mg/dL
Specific Gravity, Urine: 1.009 (ref 1.005–1.030)
pH: 6 (ref 5.0–8.0)

## 2022-02-24 LAB — GROUP A STREP BY PCR: Group A Strep by PCR: NOT DETECTED

## 2022-02-24 MED ORDER — PHENOL 1.4 % MT LIQD
1.0000 | OROMUCOSAL | Status: DC | PRN
  Filled 2022-02-24: qty 177

## 2022-02-24 NOTE — MAU Provider Note (Signed)
History     CSN: [id number]  Arrival date and time: 02/24/22 0514   Event Date/Time   First Provider Initiated Contact with Patient 02/24/22 [phone]      Chief Complaint  Patient presents with   Sore Throat   Headache   HPI [name] is a 21 y.o. year old G22P1001 female at 79w6dweeks gestation who presents to MAU reporting she woke up with a sore throat on Saturday morning. She noticed white spots at the back of her throat and increased difficulty swallowing this AM. She expresses "concern [facility] 21 [facility] and not wanting to pass anything on to her." She has not taken any medications for her sx's. She states "medications for sore throat hasn't worked in the past and we don't have any of that at home." [facility] next appt is 02/28/2022.   OB History     Gravida  2   Para  1   Term  1   Preterm      AB      Living  1      SAB      IAB      Ectopic      Multiple  0   Live Births  1           Past Medical History:  Diagnosis Date   Allergy    Anxiety    Depression    Hypertension    Hypertension    Migraine    Migraines    Movement disorder    Seizures ([place]    last seizure in 2009    Past Surgical History:  Procedure Laterality Date   BUNIONECTOMY Right 07/23/2016   BUNIONECTOMY Left 02/2016   FOOT SURGERY     WISDOM TOOTH EXTRACTION      Family History  Problem Relation Age of Onset   Seizures Mother    Depression Mother    ADD / ADHD Brother    Depression Brother    Migraines Maternal Grandmother    Depression Maternal Grandmother    Migraines Maternal Aunt    Depression Maternal Aunt    Migraines Maternal Uncle    ADD / ADHD Cousin        Many Maternal 1st Cousins have [facility]   Social History   Tobacco Use   Smoking status: Never    Passive exposure: Yes   Smokeless tobacco: Never   Tobacco comments:    Parents smoke outside  Vaping Use   Vaping Use: Never used  Substance Use  Topics   Alcohol use: No    Comment: not while preg   Drug use: No    Allergies:  Allergies  Allergen Reactions   Morphine And Related Shortness Of Breath and Other (See Comments)    Headache and "trouble breathing"   Ondansetron Hcl Rash   Zofran Rash   Ketorolac Rash    Medications Prior to Admission  Medication Sig Dispense Refill Last Dose   aspirin EC 81 MG tablet Take 1 tablet (81 mg total) by mouth daily. Take after 12 weeks for prevention of preeclampsia later in pregnancy 300 tablet 2    cyclobenzaprine (FLEXERIL) 10 MG tablet Take 1 tablet (10 mg total) by mouth 3 (three) times daily as needed (headache). 30 tablet 2    Prenatal Vit-Fe Fumarate-FA (PRENATAL VITAMINS PO) Take by mouth.       Review of Systems Physical  Exam   Blood pressure (!) 140/84, pulse 98, temperature 98.4 F (36.9 C), temperature source Oral, resp. rate 18, height '5\' 7"'$  (1.702 m), weight 102.2 kg, last menstrual period 06/18/2021, SpO2 98 %, currently breastfeeding.  Physical Exam  MAU Course  Procedures  MDM CCUA Strep Test  Results for orders placed or performed during the hospital encounter of 02/24/22 (from the past 24 hour(s))  Group A Strep by PCR     Status: None   Collection Time: 02/24/22  5:23 AM   Specimen: Throat; Sterile Swab  Result Value Ref Range   Group A Strep by PCR NOT DETECTED NOT DETECTED  Urinalysis, Routine w reflex microscopic     Status: Abnormal   Collection Time: 02/24/22  5:36 AM  Result Value Ref Range   Color, Urine STRAW (A) YELLOW   APPearance HAZY (A) CLEAR   Specific Gravity, Urine 1.009 1.005 - 1.030   pH 6.0 5.0 - 8.0   Glucose, UA NEGATIVE NEGATIVE mg/dL   Hgb urine dipstick NEGATIVE NEGATIVE   Bilirubin Urine NEGATIVE NEGATIVE   Ketones, ur NEGATIVE NEGATIVE mg/dL   Protein, ur NEGATIVE NEGATIVE mg/dL   Nitrite NEGATIVE NEGATIVE   Leukocytes,Ua NEGATIVE NEGATIVE     Assessment and Plan  Sore throat  - Continue with Chloraseptic spray  or throat lozenges prn - Wear mask at all times and practice good hand hygiene - Information provided on safe meds in pregnancy list   [redacted] weeks gestation of pregnancy   - Discharge patient - Keep scheduled appt with [facility] & MFM on 02/28/22 - Patient verbalized an understanding of the plan of care and agrees.    Laury Deep, CNM 02/24/2022, 5:52 AM

## 2022-02-24 NOTE — Discharge Instructions (Signed)

## 2022-02-24 NOTE — MAU Note (Signed)
.  Stephanie Frazier is a 21 y.o. at 34w6dhere in MAU reporting: woke up yesterday am with c/o sore throat, congestion, and this am notice white spots in the back of her throat and difficulty to swallow. Pt reports a slight HA, not taken anything for pain. Pt denies knowledge of exposure to illness, DFM, VB, LOF, and PIH s/s.   GHTN, IOL at 37 wk Onset of complaint: 8/5 am Pain score: 3/10 throat, 2/10 Ha Vitals:   02/24/22 0529  BP: (!) 140/84  Pulse: 98  Resp: 18  Temp: 98.4 F (36.9 C)  SpO2: 98%     FHT:147 Lab orders placed from triage:  STREP A, UA

## 2022-02-25 ENCOUNTER — Inpatient Hospital Stay (HOSPITAL_COMMUNITY)
Admission: AD | Admit: 2022-02-25 | Discharge: 2022-02-25 | Disposition: A | Discharge status: Home / Self Care | Payer: Medicaid Other | Attending: Family Medicine | Admitting: Family Medicine

## 2022-02-25 ENCOUNTER — Other Ambulatory Visit: Payer: Self-pay

## 2022-02-25 ENCOUNTER — Encounter: Payer: Self-pay | Admitting: Family Medicine

## 2022-02-25 ENCOUNTER — Encounter (HOSPITAL_COMMUNITY): Payer: Self-pay | Admitting: Family Medicine

## 2022-02-25 DIAGNOSIS — O09293 Supervision of pregnancy with other poor reproductive or obstetric history, third trimester: Secondary | ICD-10-CM | POA: Insufficient documentation

## 2022-02-25 DIAGNOSIS — O99613 Diseases of the digestive system complicating pregnancy, third trimester: Secondary | ICD-10-CM | POA: Insufficient documentation

## 2022-02-25 DIAGNOSIS — M549 Dorsalgia, unspecified: Secondary | ICD-10-CM

## 2022-02-25 DIAGNOSIS — O09893 Supervision of other high risk pregnancies, third trimester: Secondary | ICD-10-CM | POA: Diagnosis not present

## 2022-02-25 DIAGNOSIS — K219 Gastro-esophageal reflux disease without esophagitis: Secondary | ICD-10-CM | POA: Diagnosis not present

## 2022-02-25 DIAGNOSIS — O99891 Other specified diseases and conditions complicating pregnancy: Secondary | ICD-10-CM

## 2022-02-25 DIAGNOSIS — R079 Chest pain, unspecified: Secondary | ICD-10-CM | POA: Insufficient documentation

## 2022-02-25 DIAGNOSIS — O26893 Other specified pregnancy related conditions, third trimester: Secondary | ICD-10-CM | POA: Insufficient documentation

## 2022-02-25 DIAGNOSIS — Z8759 Personal history of other complications of pregnancy, childbirth and the puerperium: Secondary | ICD-10-CM

## 2022-02-25 DIAGNOSIS — Z3A36 36 weeks gestation of pregnancy: Secondary | ICD-10-CM | POA: Diagnosis not present

## 2022-02-25 DIAGNOSIS — R0602 Shortness of breath: Secondary | ICD-10-CM | POA: Insufficient documentation

## 2022-02-25 DIAGNOSIS — R109 Unspecified abdominal pain: Secondary | ICD-10-CM | POA: Diagnosis not present

## 2022-02-25 DIAGNOSIS — O09899 Supervision of other high risk pregnancies, unspecified trimester: Secondary | ICD-10-CM

## 2022-02-25 DIAGNOSIS — Z3689 Encounter for other specified antenatal screening: Secondary | ICD-10-CM

## 2022-02-25 LAB — CBC
HCT: 35.2 % — ABNORMAL LOW (ref 36.0–46.0)
Hemoglobin: 11.7 g/dL — ABNORMAL LOW (ref 12.0–15.0)
MCH: 30.4 pg (ref 26.0–34.0)
MCHC: 33.2 g/dL (ref 30.0–36.0)
MCV: 91.4 fL (ref 80.0–100.0)
Platelets: 177 10*3/uL (ref 150–400)
RBC: 3.85 MIL/uL — ABNORMAL LOW (ref 3.87–5.11)
RDW: 13.6 % (ref 11.5–15.5)
WBC: 9.9 10*3/uL (ref 4.0–10.5)
nRBC: 0 % (ref 0.0–0.2)

## 2022-02-25 MED ORDER — LIDOCAINE VISCOUS HCL 2 % MT SOLN
15.0000 mL | Freq: Once | OROMUCOSAL | Status: AC
  Administered 2022-02-25: 15 mL via ORAL
  Filled 2022-02-25: qty 15

## 2022-02-25 MED ORDER — ALUM & MAG HYDROXIDE-SIMETH 200-200-20 MG/5ML PO SUSP
30.0000 mL | Freq: Once | ORAL | Status: AC
  Administered 2022-02-25: 30 mL via ORAL
  Filled 2022-02-25: qty 30

## 2022-02-25 MED ORDER — FAMOTIDINE 20 MG PO TABS: 20.0000 mg | ORAL_TABLET | Freq: Every day | ORAL | 0 refills | Status: DC

## 2022-02-25 NOTE — MAU Note (Signed)
Stephanie Frazier is a 21 y.o. at 23w0dhere in MAU reporting: she began having chest pain located in the center, states it's a constant throb.  Also reports having mid to lower back pain that into her side that began a couple hours ago.  States also having pain in different areas of her abdomen.  Denies VB or LOF.  Endorses +FM.  Onset of complaint: today Pain score: 5/10 abdomen & back Vitals:   02/25/22 1711  BP: 132/82  Pulse: (!) 116  Resp: 20  Temp: 98.2 F (36.8 C)  SpO2: 97%     FHT:152 bpm Lab orders placed from triage:   UA

## 2022-02-25 NOTE — MAU Provider Note (Signed)
History     CSN: [id number]  Arrival date and time: 02/25/22 1646   Event Date/Time   First Provider Initiated Contact with Patient 02/25/22 1739      Chief Complaint  Patient presents with  . Back Pain   20 y.o. G2P1001 '@36'$ .0 wks presenting with back pain, chest pain, and abdominal pain. Reports onset of back pain about a week ago but became worse today. Pain is located in mid back and bilateral. Pain is intermittent and rates 5/10. Reports occasional lifting of her 21 year old. Denies urinary sx other than frequency. Endorses constant throbbing in mid sternal CP that started 2 hrs ago. Rates pain 5/10. Denies cough but reports nasal congestion and sore throat d/t allergies. Endorses occasional SOB. Abdominal pain is intermittent and in all quadrants. Reports tightening at times. Denies VB or LOF. Reports good FM. Reports hx of heartburn and uses TUMS.   OB History     Gravida  2   Para  1   Term  1   Preterm      AB      Living  1      SAB      IAB      Ectopic      Multiple  0   Live Births  1           Past Medical History:  Diagnosis Date  . Allergy   . Anxiety   . Depression   . Hypertension   . Hypertension   . Migraine   . Migraines   . Movement disorder   . Seizures ([place])    last seizure in 2009    Past Surgical History:  Procedure Laterality Date  . BUNIONECTOMY Right 07/23/2016  . BUNIONECTOMY Left 02/2016  . FOOT SURGERY    . WISDOM TOOTH EXTRACTION      Family History  Problem Relation Age of Onset  . Seizures Mother   . Depression Mother   . ADD / ADHD Brother   . Depression Brother   . Migraines Maternal Grandmother   . Depression Maternal Grandmother   . Migraines Maternal Aunt   . Depression Maternal Aunt   . Migraines Maternal Uncle   . ADD / ADHD Cousin        Many Maternal 1st Cousins have [facility]    Social History   Tobacco Use  . Smoking status: Never    Passive exposure: Yes  . Smokeless tobacco: Never  .  Tobacco comments:    Parents smoke outside  Vaping Use  . Vaping Use: Never used  Substance Use Topics  . Alcohol use: No    Comment: not while preg  . Drug use: No    Allergies:  Allergies  Allergen Reactions  . Morphine And Related Shortness Of Breath and Other (See Comments)    Headache and "trouble breathing"  . Ondansetron Hcl Rash  . Zofran Rash  . Ketorolac Rash    No medications prior to admission.    Review of Systems  Constitutional:  Negative for chills and fever.  HENT:  Positive for congestion and sore throat.   Respiratory:  Negative for cough.   Cardiovascular:  Positive for chest pain.  Gastrointestinal:  Positive for abdominal pain. Negative for nausea and vomiting.  Genitourinary:  Negative for dysuria, hematuria, urgency, vaginal bleeding and vaginal discharge.  Musculoskeletal:  Positive for back pain.   Physical Exam   Blood pressure 125/84, pulse (!) 108, temperature 98.2 F (  36.8 C), temperature source Oral, resp. rate 20, height '5\' 7"'$  (1.702 m), weight 102 kg, last menstrual period 06/18/2021, SpO2 97 %, currently breastfeeding.  Physical Exam Vitals and nursing note reviewed. Exam conducted with a chaperone present.  Constitutional:      Appearance: Normal appearance.  HENT:     Head: Normocephalic and atraumatic.  Cardiovascular:     Rate and Rhythm: Regular rhythm. Tachycardia present.     Heart sounds: Normal heart sounds.  Pulmonary:     Effort: Pulmonary effort is normal. No respiratory distress.     Breath sounds: Normal breath sounds. No stridor. No wheezing, rhonchi or rales.  Abdominal:     Palpations: Abdomen is soft.     Tenderness: There is no abdominal tenderness. There is no right CVA tenderness or left CVA tenderness.     Comments: gravid  Genitourinary:    Comments: VE: FT/60/vtx Musculoskeletal:        General: Normal range of motion.     Cervical back: Normal range of motion.  Skin:    General: Skin is warm and dry.   Neurological:     General: No focal deficit present.     Mental Status: She is alert and oriented to person, place, and time.  Psychiatric:        Mood and Affect: Mood is anxious.        Behavior: Behavior normal.  EFM: 150 bpm, mod variability, + accels, no decels Toco: rare  Results for orders placed or performed during the hospital encounter of 02/25/22 (from the past 24 hour(s))  CBC     Status: Abnormal   Collection Time: 02/25/22  6:02 PM  Result Value Ref Range   WBC 9.9 4.0 - 10.5 K/uL   RBC 3.85 (L) 3.87 - 5.11 MIL/uL   Hemoglobin 11.7 (L) 12.0 - 15.0 g/dL   HCT 35.2 (L) 36.0 - 46.0 %   MCV 91.4 80.0 - 100.0 fL   MCH 30.4 26.0 - 34.0 pg   MCHC 33.2 30.0 - 36.0 g/dL   RDW 13.6 11.5 - 15.5 %   Platelets 177 150 - 400 K/uL   nRBC 0.0 0.0 - 0.2 %    MAU Course  Procedures GI cocktail  MDM Labs and EKG ordered. EKG sinus tach, read by Dr. Nehemiah Settle. CP improved after cocktail, no signs of cardiac process, suspect GERD. Rx Pepcid. Back pain likely MSK, no signs of pyelo or UTI, UA from yesterday normal. No signs of acute abd process or PTL. Pt reassured. Stable for discharge home.   Assessment and Plan   1. Supervision of other high risk pregnancy, antepartum, unspecified trimester   2. History of gestational hypertension   3. [redacted] weeks gestation of pregnancy   4. NST (non-stress test) reactive   5. Gastroesophageal reflux disease without esophagitis   6. Back pain affecting pregnancy in third trimester    Discharge home [facility] at [facility] as scheduled    Julianne Handler, CNM 02/25/2022, 7:36 PM

## 2022-02-25 NOTE — MAU Provider Note (Signed)
MAU Provider Note (Late Entry): Stephanie Frazier is a 21 y.o. G20P1001 female reporting she woke up with a sore throat on Saturday morning. She noticed white spots at the back of her throat and increased difficulty swallowing Sunday morning. 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 She states "medications for sore throat hasn't worked in the past and we don't have any of that home." She receives [facility]; next appt is 02/28/2022.  Associated symptoms:  No fever and chills No abdominal pain No vaginal bleeding No vaginal discharge No urinary complaints No GI complaints  ROS: Review of Systems  Constitutional: Negative.   HENT:  Positive for sore throat (with white spots at back of throat).   Eyes: Negative.   Respiratory: Negative.    Cardiovascular: Negative.   Gastrointestinal: Negative.   Genitourinary: Negative.   Musculoskeletal: Negative.   Skin: Negative.   Neurological: Negative.   Endo/Heme/Allergies: Negative.   Psychiatric/Behavioral: Negative.        PE: 02/24/2022: BP 140/84, P 98 Temperature 98.4, Resp Rate 18, height 5'7" weight 102.2 kg   Gen: calm comfortable, NAD Resp: normal effort, no distress Heart: Regular rate Abd: Soft, NT, Pos BS x 4 Neuro: A&O x 4 Pelvic exam: deferred.  ROS, labs, PMH reviewed  Results for orders placed or performed during the hospital encounter of 02/24/22 (from the past 48 hour(s))  Group A Strep by PCR     Status: None   Collection Time: 02/24/22  5:23 AM   Specimen: Throat; Sterile Swab  Result Value Ref Range   Group A Strep by PCR NOT DETECTED NOT DETECTED    Comment: Performed at [facility], [address], [place], [place] [address]  Urinalysis, Routine w reflex microscopic     Status: Abnormal   Collection Time: 02/24/22  5:36 AM  Result Value Ref Range   Color, Urine STRAW (A) YELLOW   APPearance  HAZY (A) CLEAR   Specific Gravity, Urine 1.009 1.005 - 1.030   pH 6.0 5.0 - 8.0   Glucose, UA NEGATIVE NEGATIVE mg/dL   Hgb urine dipstick NEGATIVE NEGATIVE   Bilirubin Urine NEGATIVE NEGATIVE   Ketones, ur NEGATIVE NEGATIVE mg/dL   Protein, ur NEGATIVE NEGATIVE mg/dL   Nitrite NEGATIVE NEGATIVE   Leukocytes,Ua NEGATIVE NEGATIVE    Comment: Performed at [facility] [address] [place], [place] [address]    MDM CCUA Strep A swab  Assessment Sore Throat - Continue with Chloraseptic spray or throat lozenges prn - Wear mask at all times and practice good hand hygiene - Information provided on safe meds in pregnancy list   [redacted] weeks gestation of pregnancy  Plan: - Discharge home in stable condition - Discussed safe meds in pregnancy list given. - Follow-up as scheduled at [facility] on 02/28/2022 or sooner as needed if symptoms worsen. - Return to maternity admissions symptoms worsen  Laury Deep, CNM 02/24/2022 5:30 AM

## 2022-02-27 ENCOUNTER — Ambulatory Visit: Payer: Medicaid Other | Attending: Obstetrics | Admitting: *Deleted

## 2022-02-27 ENCOUNTER — Ambulatory Visit: Payer: Medicaid Other | Admitting: *Deleted

## 2022-02-27 VITALS — BP 139/74 | HR 91

## 2022-02-27 DIAGNOSIS — O99213 Obesity complicating pregnancy, third trimester: Secondary | ICD-10-CM | POA: Insufficient documentation

## 2022-02-27 DIAGNOSIS — O133 Gestational [pregnancy-induced] hypertension without significant proteinuria, third trimester: Secondary | ICD-10-CM | POA: Diagnosis not present

## 2022-02-27 DIAGNOSIS — Z3A00 Weeks of gestation of pregnancy not specified: Secondary | ICD-10-CM | POA: Diagnosis not present

## 2022-02-27 DIAGNOSIS — O09899 Supervision of other high risk pregnancies, unspecified trimester: Secondary | ICD-10-CM

## 2022-02-27 DIAGNOSIS — Z8759 Personal history of other complications of pregnancy, childbirth and the puerperium: Secondary | ICD-10-CM

## 2022-02-27 DIAGNOSIS — E669 Obesity, unspecified: Secondary | ICD-10-CM | POA: Diagnosis not present

## 2022-02-27 NOTE — Procedures (Signed)
Stephanie Frazier 06-13-2001 [redacted]w[redacted]d Fetus A Non-Stress Test Interpretation for 02/27/22  Indication:  GHTN, Obesity  Fetal Heart Rate A Mode: External Baseline Rate (A): 140 bpm Variability: Moderate Accelerations: 15 x 15 Decelerations: None Multiple birth?: No  Uterine Activity Mode: Palpation, Toco Contraction Frequency (min): none Resting Tone Palpated: Relaxed  Interpretation (Fetal Testing) Nonstress Test Interpretation: Reactive Overall Impression: Reassuring for gestational age Comments: Dr. BGertie Exonreviewed tracing

## 2022-02-28 ENCOUNTER — Encounter: Payer: Medicaid Other | Admitting: Family Medicine

## 2022-02-28 ENCOUNTER — Other Ambulatory Visit: Payer: Self-pay

## 2022-02-28 ENCOUNTER — Ambulatory Visit (INDEPENDENT_AMBULATORY_CARE_PROVIDER_SITE_OTHER): Payer: Medicaid Other | Admitting: Family Medicine

## 2022-02-28 ENCOUNTER — Other Ambulatory Visit (HOSPITAL_COMMUNITY)
Admission: RE | Admit: 2022-02-28 | Discharge: 2022-02-28 | Disposition: A | Discharge status: Home / Self Care | Payer: Medicaid Other | Source: Ambulatory Visit | Attending: Family Medicine | Admitting: Family Medicine

## 2022-02-28 VITALS — BP 143/105 | HR 107 | Wt 227.0 lb

## 2022-02-28 DIAGNOSIS — Z8759 Personal history of other complications of pregnancy, childbirth and the puerperium: Secondary | ICD-10-CM

## 2022-02-28 DIAGNOSIS — O09899 Supervision of other high risk pregnancies, unspecified trimester: Secondary | ICD-10-CM

## 2022-02-28 DIAGNOSIS — O133 Gestational [pregnancy-induced] hypertension without significant proteinuria, third trimester: Secondary | ICD-10-CM

## 2022-02-28 DIAGNOSIS — Z3A36 36 weeks gestation of pregnancy: Secondary | ICD-10-CM

## 2022-02-28 NOTE — Progress Notes (Signed)
   PRENATAL VISIT NOTE  Subjective:  Stephanie Frazier is a 21 y.o. G2P1001 at 64w3dbeing seen today for ongoing prenatal care.  She is currently monitored for the following issues for this high-risk pregnancy and has ADHD (attention deficit hyperactivity disorder); Migraine without aura and with status migrainosus, not intractable; Episodic tension-type headache, not intractable; Mood disorder ([place]; Insomnia; Flank pain, acute; Nephrolithiasis; Severe recurrent major depression with psychotic features ([place]; Gestational hypertension; Supervision of other high risk pregnancy, antepartum, unspecified trimester; History of gestational hypertension; Anxiety; Neuropathy; and History of seizures on their problem list.  Patient reports no complaints.  Contractions: Irritability. Vag. Bleeding: None.  Movement: Present. Denies leaking of fluid.   The following portions of the patient's history were reviewed and updated as appropriate: allergies, current medications, past family history, past medical history, past social history, past surgical history and problem list.   Objective:   Vitals:   02/28/22 1524  BP: (!) 143/105  Pulse: (!) 107  Weight: 227 lb (103 kg)    Fetal Status: Fetal Heart Rate (bpm): 163 Fundal Height: 34 cm Movement: Present  Presentation: Vertex  General:  Alert, oriented and cooperative. Patient is in no acute distress.  Skin: Skin is warm and dry. No rash noted.   Cardiovascular: Normal heart rate noted  Respiratory: Normal respiratory effort, no problems with respiration noted  Abdomen: Soft, gravid, appropriate for gestational age.  Pain/Pressure: Present     Pelvic: Cervical exam performed in the presence of a chaperone Dilation: 2 Effacement (%): 50 Station: -2, -1  Extremities: Normal range of motion.  Edema: Trace  Mental Status: Normal mood and affect. Normal behavior. Normal judgment and thought content.   Assessment and Plan:  Pregnancy: G2P1001 at 3103w3d.  Supervision of other high risk pregnancy, antepartum, unspecified trimester Cultures today - Strep Gp B NAA - Cervicovaginal ancillary only( [place])  2. History of gestational hypertension On ASA  3. Gestational hypertension, third trimester With 2 BPs up and today significant increase. Denies H/A, vision changes.  Labs today--warning signs reviewed IOL @ 37 wks. - CBC - Comprehensive metabolic panel - Protein / creatinine ratio, urine  Preterm labor symptoms and general obstetric precautions including but not limited to vaginal bleeding, contractions, leaking of fluid and fetal movement were reviewed in detail with the patient. Please refer to After Visit Summary for other counseling recommendations.   Return in 1 week (on 03/07/2022).  Future Appointments  Date Time Provider [place]8/16/2023  2:30 PM WMComprehensive Outpatient SurgeURSE [facility]8/16/2023  2:45 PM W[facility]-MFC US6 [facility]8/17/2023 11:15 AM ErChancy MilroyMD [facility]8/24/2023  3:35 PM PrDonnamae JudeMD [facility]MSan[facility]11/17/2023  3:40 PM PeJohney FrameHeGreer EeMD CVD-WMC None    TaDonnamae JudeMD

## 2022-03-01 ENCOUNTER — Telehealth (HOSPITAL_COMMUNITY): Payer: Self-pay | Admitting: *Deleted

## 2022-03-01 ENCOUNTER — Encounter (HOSPITAL_COMMUNITY): Payer: Self-pay

## 2022-03-01 LAB — COMPREHENSIVE METABOLIC PANEL
ALT: 14 IU/L (ref 0–32)
AST: 14 IU/L (ref 0–40)
Albumin/Globulin Ratio: 1.2 (ref 1.2–2.2)
Albumin: 3.7 g/dL — ABNORMAL LOW (ref 4.0–5.0)
Alkaline Phosphatase: 143 IU/L — ABNORMAL HIGH (ref 44–121)
BUN/Creatinine Ratio: 16 (ref 9–23)
BUN: 10 mg/dL (ref 6–20)
Bilirubin Total: <0.2 mg/dL (ref 0.0–1.2)
CO2: 19 mmol/L — ABNORMAL LOW (ref 20–29)
Calcium: 9.3 mg/dL (ref 8.7–10.2)
Chloride: 103 mmol/L (ref 96–106)
Creatinine, Ser: 0.62 mg/dL (ref 0.57–1.00)
Globulin, Total: 3.1 g/dL (ref 1.5–4.5)
Glucose: 69 mg/dL — ABNORMAL LOW (ref 70–99)
Potassium: 4.7 mmol/L (ref 3.5–5.2)
Sodium: 137 mmol/L (ref 134–144)
Total Protein: 6.8 g/dL (ref 6.0–8.5)
eGFR: 130 mL/min/{1.73_m2} (ref >59)

## 2022-03-01 LAB — CBC
Hematocrit: 35.5 % (ref 34.0–46.6)
Hemoglobin: 12.3 g/dL (ref 11.1–15.9)
MCH: 30.9 pg (ref 26.6–33.0)
MCHC: 34.6 g/dL (ref 31.5–35.7)
MCV: 89 fL (ref 79–97)
Platelets: 201 10*3/uL (ref 150–450)
RBC: 3.98 x10E6/uL (ref 3.77–5.28)
RDW: 13.1 % (ref 11.7–15.4)
WBC: 9.9 10*3/uL (ref 3.4–10.8)

## 2022-03-01 LAB — CERVICOVAGINAL ANCILLARY ONLY
Chlamydia: NEGATIVE
Comment: NEGATIVE
Comment: NORMAL
Neisseria Gonorrhea: NEGATIVE

## 2022-03-01 LAB — PROTEIN / CREATININE RATIO, URINE
Creatinine, Urine: 42.6 mg/dL
Protein, Ur: 5 mg/dL
Protein/Creat Ratio: 117 mg/g creat (ref 0–200)

## 2022-03-01 NOTE — Telephone Encounter (Signed)
Preadmission screen  

## 2022-03-02 ENCOUNTER — Other Ambulatory Visit: Payer: Self-pay

## 2022-03-02 ENCOUNTER — Inpatient Hospital Stay (HOSPITAL_COMMUNITY)
Admission: AD | Admit: 2022-03-02 | Discharge: 2022-03-02 | Disposition: A | Discharge status: Home / Self Care | Payer: Medicaid Other | Attending: Obstetrics and Gynecology | Admitting: Obstetrics and Gynecology

## 2022-03-02 ENCOUNTER — Encounter: Payer: Self-pay | Admitting: Family Medicine

## 2022-03-02 DIAGNOSIS — O133 Gestational [pregnancy-induced] hypertension without significant proteinuria, third trimester: Secondary | ICD-10-CM | POA: Diagnosis not present

## 2022-03-02 DIAGNOSIS — Z3A36 36 weeks gestation of pregnancy: Secondary | ICD-10-CM | POA: Insufficient documentation

## 2022-03-02 DIAGNOSIS — O09293 Supervision of pregnancy with other poor reproductive or obstetric history, third trimester: Secondary | ICD-10-CM | POA: Diagnosis present

## 2022-03-02 DIAGNOSIS — O09893 Supervision of other high risk pregnancies, third trimester: Secondary | ICD-10-CM | POA: Diagnosis not present

## 2022-03-02 DIAGNOSIS — O09899 Supervision of other high risk pregnancies, unspecified trimester: Secondary | ICD-10-CM

## 2022-03-02 DIAGNOSIS — O113 Pre-existing hypertension with pre-eclampsia, third trimester: Secondary | ICD-10-CM | POA: Diagnosis not present

## 2022-03-02 DIAGNOSIS — O10913 Unspecified pre-existing hypertension complicating pregnancy, third trimester: Secondary | ICD-10-CM | POA: Diagnosis not present

## 2022-03-02 DIAGNOSIS — Z3689 Encounter for other specified antenatal screening: Secondary | ICD-10-CM

## 2022-03-02 DIAGNOSIS — Z8759 Personal history of other complications of pregnancy, childbirth and the puerperium: Secondary | ICD-10-CM

## 2022-03-02 LAB — CBC WITH DIFFERENTIAL/PLATELET
Abs Immature Granulocytes: 0.03 10*3/uL (ref 0.00–0.07)
Basophils Absolute: 0 10*3/uL (ref 0.0–0.1)
Basophils Relative: 0 %
Eosinophils Absolute: 0.1 10*3/uL (ref 0.0–0.5)
Eosinophils Relative: 1 %
HCT: 35.2 % — ABNORMAL LOW (ref 36.0–46.0)
Hemoglobin: 12.2 g/dL (ref 12.0–15.0)
Immature Granulocytes: 0 %
Lymphocytes Relative: 22 %
Lymphs Abs: 2 10*3/uL (ref 0.7–4.0)
MCH: 31.2 pg (ref 26.0–34.0)
MCHC: 34.7 g/dL (ref 30.0–36.0)
MCV: 90 fL (ref 80.0–100.0)
Monocytes Absolute: 0.7 10*3/uL (ref 0.1–1.0)
Monocytes Relative: 7 %
Neutro Abs: 6.5 10*3/uL (ref 1.7–7.7)
Neutrophils Relative %: 70 %
Platelets: 200 10*3/uL (ref 150–400)
RBC: 3.91 MIL/uL (ref 3.87–5.11)
RDW: 13.8 % (ref 11.5–15.5)
WBC: 9.4 10*3/uL (ref 4.0–10.5)
nRBC: 0 % (ref 0.0–0.2)

## 2022-03-02 LAB — COMPREHENSIVE METABOLIC PANEL
ALT: 17 U/L (ref 0–44)
AST: 19 U/L (ref 15–41)
Albumin: 2.6 g/dL — ABNORMAL LOW (ref 3.5–5.0)
Alkaline Phosphatase: 115 U/L (ref 38–126)
Anion gap: 7 (ref 5–15)
BUN: 12 mg/dL (ref 6–20)
CO2: 23 mmol/L (ref 22–32)
Calcium: 9.3 mg/dL (ref 8.9–10.3)
Chloride: 106 mmol/L (ref 98–111)
Creatinine, Ser: 0.63 mg/dL (ref 0.44–1.00)
GFR, Estimated: >60 mL/min (ref >60)
Glucose, Bld: 97 mg/dL (ref 70–99)
Potassium: 3.9 mmol/L (ref 3.5–5.1)
Sodium: 136 mmol/L (ref 135–145)
Total Bilirubin: <0.1 mg/dL — ABNORMAL LOW (ref 0.3–1.2)
Total Protein: 6.3 g/dL — ABNORMAL LOW (ref 6.5–8.1)

## 2022-03-02 LAB — PROTEIN / CREATININE RATIO, URINE
Creatinine, Urine: 130 mg/dL
Protein Creatinine Ratio: 0.12 mg/mg{Cre} (ref 0.00–0.15)
Total Protein, Urine: 16 mg/dL

## 2022-03-02 LAB — URINALYSIS, ROUTINE W REFLEX MICROSCOPIC
Bilirubin Urine: NEGATIVE
Glucose, UA: NEGATIVE mg/dL
Hgb urine dipstick: NEGATIVE
Ketones, ur: NEGATIVE mg/dL
Nitrite: NEGATIVE
Protein, ur: NEGATIVE mg/dL
Specific Gravity, Urine: 1.018 (ref 1.005–1.030)
pH: 6 (ref 5.0–8.0)

## 2022-03-02 LAB — STREP GP B NAA: Strep Gp B NAA: NEGATIVE

## 2022-03-02 NOTE — MAU Provider Note (Signed)
History     CSN: [id number]  Arrival date and time: 03/02/22 1503   Event Date/Time   First Provider Initiated Contact with Patient 03/02/22 1623      Chief Complaint  Patient presents with   BP Evaluation   [name] is a 21 y.o. G2P1001 at 23w5dwho presents to MAU for preeclampsia evaluation after she was taking her blood pressure at home and it was found to be elevated. Patient has been diagnosed with gHTN and is schedule for induction on 8/15. Patient states that she currently has a 2/10 HA, but it is significantly less pain than she experiences with her migraines and tension headaches, so she has not yet taken anything for it because the pain is so mild. Patient also reported to the nurse that she has been seeing occasional white spots, but also reports that she has seen these same spots even prior to pregnancy, but that the frequency of seeing the spots has been increased since pregnancy started.  Pt denies blurry vision, N/V, epigastric pain, swelling in face and hands, sudden weight gain. Pt denies chest pain and SOB.  Pt denies constipation, diarrhea, or urinary problems. Pt denies fever, chills, fatigue, sweating or changes in appetite. Pt denies dizziness, light-headedness, weakness.  Pt denies VB, ctx, LOF and reports good FM.    OB History     Gravida  2   Para  1   Term  1   Preterm      AB      Living  1      SAB      IAB      Ectopic      Multiple  0   Live Births  1           Past Medical History:  Diagnosis Date   Allergy    Anxiety    Depression    Hypertension    Hypertension    Migraine    Migraines    Movement disorder    Seizures ([place]    last seizure in 2009    Past Surgical History:  Procedure Laterality Date   BUNIONECTOMY Right 07/23/2016   BUNIONECTOMY Left 02/2016   FOOT SURGERY     WISDOM TOOTH EXTRACTION      Family History  Problem Relation Age of Onset   Seizures Mother    Depression  Mother    Asthma Sister    ADD / ADHD Brother    Depression Brother    Migraines Maternal Aunt    Depression Maternal Aunt    Migraines Maternal Uncle    Hypertension Maternal Grandmother    Migraines Maternal Grandmother    Depression Maternal Grandmother    Heart disease Maternal Grandfather    ADD / ADHD Cousin        Many Maternal 1st Cousins have ABelle Vernon  Cancer Other    Stroke Neg Hx     Social History   Tobacco Use   Smoking status: Never    Passive exposure: Yes   Smokeless tobacco: Never   Tobacco comments:    Parents smoke outside  Vaping Use   Vaping Use: Never used  Substance Use Topics   Alcohol use: No    Comment: not while preg   Drug use: No    Allergies:  Allergies  Allergen Reactions   Morphine And Related Shortness Of Breath and Other (See Comments)    Headache and "trouble breathing"   Ondansetron  Hcl Rash   Zofran Rash   Ketorolac Rash    Medications Prior to Admission  Medication Sig Dispense Refill Last Dose   aspirin EC 81 MG tablet Take 1 tablet (81 mg total) by mouth daily. Take after 12 weeks for prevention of preeclampsia later in pregnancy 300 tablet 2    cyclobenzaprine (FLEXERIL) 10 MG tablet Take 1 tablet (10 mg total) by mouth 3 (three) times daily as needed (headache). 30 tablet 2    famotidine (PEPCID) 20 MG tablet Take 1 tablet (20 mg total) by mouth at bedtime. (Patient not taking: Reported on 02/27/2022) 30 tablet 0    Prenatal Vit-Fe Fumarate-FA (PRENATAL VITAMINS PO) Take by mouth.       Review of Systems  Constitutional:  Negative for chills, diaphoresis, fatigue and fever.  Eyes:  Positive for visual disturbance.  Respiratory:  Negative for shortness of breath.   Cardiovascular:  Negative for chest pain.  Gastrointestinal:  Negative for abdominal pain, constipation, diarrhea, nausea and vomiting.  Genitourinary:  Negative for dysuria, flank pain, frequency, pelvic pain, urgency, vaginal bleeding and vaginal discharge.   Neurological:  Positive for headaches. Negative for dizziness, weakness and light-headedness.   Physical Exam   Blood pressure 133/89, pulse (!) 103, temperature 97.9 F (36.6 C), temperature source Oral, resp. rate 18, height '5\' 7"'$  (1.702 m), weight 103.1 kg, last menstrual period 06/18/2021, SpO2 98 %, currently breastfeeding.  Patient Vitals for the past 24 hrs:  BP Temp Temp src Pulse Resp SpO2 Height Weight  03/02/22 1645 133/81 -- -- 97 -- -- -- --  03/02/22 1630 133/89 -- -- (!) 103 -- -- -- --  03/02/22 1615 135/82 -- -- (!) 102 18 -- -- --  03/02/22 1600 133/85 -- -- (!) 106 18 -- -- --  03/02/22 1544 139/82 -- -- 98 18 -- -- --  03/02/22 1538 (!) 143/80 -- -- (!) 102 18 -- -- --  03/02/22 1522 (!) 145/88 97.9 F (36.6 C) Oral (!) 116 18 98 % -- --  03/02/22 1517 -- -- -- -- -- -- '5\' 7"'$  (1.702 m) 103.1 kg   Physical Exam Vitals and nursing note reviewed.  Constitutional:      General: She is not in acute distress.    Appearance: Normal appearance. She is not ill-appearing, toxic-appearing or diaphoretic.  HENT:     Head: Normocephalic and atraumatic.  Pulmonary:     Effort: Pulmonary effort is normal.  Neurological:     Mental Status: She is alert and oriented to person, place, and time.  Psychiatric:        Mood and Affect: Mood normal.        Behavior: Behavior normal.        Thought Content: Thought content normal.        Judgment: Judgment normal.    Results for orders placed or performed during the hospital encounter of 03/02/22 (from the past 24 hour(s))  Urinalysis, Routine w reflex microscopic Urine, Clean Catch     Status: Abnormal   Collection Time: 03/02/22  3:33 PM  Result Value Ref Range   Color, Urine YELLOW YELLOW   APPearance HAZY (A) CLEAR   Specific Gravity, Urine 1.018 1.005 - 1.030   pH 6.0 5.0 - 8.0   Glucose, UA NEGATIVE NEGATIVE mg/dL   Hgb urine dipstick NEGATIVE NEGATIVE   Bilirubin Urine NEGATIVE NEGATIVE   Ketones, ur NEGATIVE  NEGATIVE mg/dL   Protein, ur NEGATIVE NEGATIVE mg/dL   Nitrite NEGATIVE  NEGATIVE   Leukocytes,Ua TRACE (A) NEGATIVE   RBC / HPF 0-5 0 - 5 RBC/hpf   WBC, UA 6-10 0 - 5 WBC/hpf   Bacteria, UA RARE (A) NONE SEEN   Squamous Epithelial / LPF 11-20 0 - 5   Mucus PRESENT   Protein / creatinine ratio, urine     Status: None   Collection Time: 03/02/22  3:33 PM  Result Value Ref Range   Creatinine, Urine 130 mg/dL   Total Protein, Urine 16 mg/dL   Protein Creatinine Ratio 0.12 0.00 - 0.15 mg/mg[Cre]  CBC with Differential/Platelet     Status: Abnormal   Collection Time: 03/02/22  3:41 PM  Result Value Ref Range   WBC 9.4 4.0 - 10.5 K/uL   RBC 3.91 3.87 - 5.11 MIL/uL   Hemoglobin 12.2 12.0 - 15.0 g/dL   HCT 35.2 (L) 36.0 - 46.0 %   MCV 90.0 80.0 - 100.0 fL   MCH 31.2 26.0 - 34.0 pg   MCHC 34.7 30.0 - 36.0 g/dL   RDW 13.8 11.5 - 15.5 %   Platelets 200 150 - 400 K/uL   nRBC 0.0 0.0 - 0.2 %   Neutrophils Relative % 70 %   Neutro Abs 6.5 1.7 - 7.7 K/uL   Lymphocytes Relative 22 %   Lymphs Abs 2.0 0.7 - 4.0 K/uL   Monocytes Relative 7 %   Monocytes Absolute 0.7 0.1 - 1.0 K/uL   Eosinophils Relative 1 %   Eosinophils Absolute 0.1 0.0 - 0.5 K/uL   Basophils Relative 0 %   Basophils Absolute 0.0 0.0 - 0.1 K/uL   Immature Granulocytes 0 %   Abs Immature Granulocytes 0.03 0.00 - 0.07 K/uL  Comprehensive metabolic panel     Status: Abnormal   Collection Time: 03/02/22  3:41 PM  Result Value Ref Range   Sodium 136 135 - 145 mmol/L   Potassium 3.9 3.5 - 5.1 mmol/L   Chloride 106 98 - 111 mmol/L   CO2 23 22 - 32 mmol/L   Glucose, Bld 97 70 - 99 mg/dL   BUN 12 6 - 20 mg/dL   Creatinine, Ser 0.63 0.44 - 1.00 mg/dL   Calcium 9.3 8.9 - 10.3 mg/dL   Total Protein 6.3 (L) 6.5 - 8.1 g/dL   Albumin 2.6 (L) 3.5 - 5.0 g/dL   AST 19 15 - 41 U/L   ALT 17 0 - 44 U/L   Alkaline Phosphatase 115 38 - 126 U/L   Total Bilirubin <0.1 (L) 0.3 - 1.2 mg/dL   GFR, Estimated >60 >60 mL/min   Anion gap 7 5  - 15   [place] MFM FETAL BPP WO NON STRESS  Result Date: 02/21/2022 ----------------------------------------------------------------------  OBSTETRICS REPORT                       (Signed Final 02/21/2022 01:35 pm) ---------------------------------------------------------------------- Patient Info  ID #:       [id number]                          D.O.B.:  [birth year] (20 yrs)  Name:       Asa Saunas                Visit Date: 02/21/2022 01:16 pm ---------------------------------------------------------------------- Performed By  Attending:        Johnell Comings MD         Ref. Address:     [address], [place]  Performed By:     Benson [place]          [facility]                                     [facility]                                                             [place] [facility]                                                             Women  Referred By:      Truett Mainland                    MD ---------------------------------------------------------------------- Orders  #  Description                           Code        Ordered By  1  [place] MFM FETAL BPP WO NON               76819.01    YU FANG     STRESS ----------------------------------------------------------------------  #  Order #                     Accession #                Episode #  1  [id number]                   [id number]                 [id number] ---------------------------------------------------------------------- Indications  Gestational hypertension without significant   O13.3  proteinuria, third trimester  Poor obstetric history: Previous               O09.299  preeclampsia / eclampsia/gestational HTN  [redacted] weeks gestation of pregnancy                Z3A.35  LR NIPS ---------------------------------------------------------------------- Vital Signs  BP:  133/68 ---------------------------------------------------------------------- Fetal Evaluation  Num Of Fetuses:         1  Fetal Heart Rate(bpm):  145  Cardiac Activity:       Observed  Presentation:           Cephalic  Placenta:               Posterior Fundal  P. Cord Insertion:      Previously Visualized  Amniotic Fluid  AFI FV:      Within normal limits  AFI Sum(cm)     %Tile       Largest Pocket(cm)  10.48           25          4.59  RUQ(cm)       RLQ(cm)       LUQ(cm)        LLQ(cm)  1.84          2.77          4.59           1.28 ---------------------------------------------------------------------- Biophysical Evaluation  Amniotic F.V:   Pocket => 2 cm             F. Tone:        Observed  F. Movement:    Observed                   Score:          8/8  F. Breathing:   Observed ---------------------------------------------------------------------- OB History  Gravidity:    2  Living:       1 ---------------------------------------------------------------------- Gestational Age  LMP:           35w 3d        Date:  06/18/21                 EDD:   03/25/22  Best:          Barbie Haggis 3d     Det. By:  LMP  (06/18/21)          EDD:   03/25/22 ---------------------------------------------------------------------- Comments  This patient was seen for a BPP due to gestational  hypertension.  The patient's blood pressures  today were  146/89 and 133/68.  She denies any signs or symptoms of  severe preeclampsia.  A BPP performed today was 8 out of 8.  There was normal amniotic fluid noted on today's exam.  Preeclampsia precautions were reviewed.  Due to gestational hypertension, delivery should probably  occur at between 37 to 38 weeks.  Another BPP was scheduled in 1 week. ----------------------------------------------------------------------                  Johnell Comings, MD Electronically Signed Final Report   02/21/2022 01:35 pm ----------------------------------------------------------------------  [place] MFM OB FOLLOW UP  Result  Date: 02/13/2022 ----------------------------------------------------------------------  OBSTETRICS REPORT                       (Signed Final 02/13/2022 04:12 pm) ---------------------------------------------------------------------- Patient Info  ID #:       [id number]                          D.O.B.:  Nov 22, 2000 (20 yrs)  Name:       BRIYONNA OMARA                Visit Date: 02/13/2022 03:20 pm ---------------------------------------------------------------------- Performed By  Attending:        Johnell Comings MD         Ref. Address:     [address]                                                             [place], [place]  Performed By:     Rolm Bookbinder RDMS     Location:         [facility]                                                             [facility] at                                                             [place] for                                                             Women  Referred By:      Truett Mainland                    MD ---------------------------------------------------------------------- Orders  #  Description                           Code        Ordered By  1  [place] MFM OB FOLLOW UP                   21308.65    Emeterio Reeve ----------------------------------------------------------------------  #  Order #                     Accession #                Episode #  1  [id number]                   [id number]                 [id number] ---------------------------------------------------------------------- Indications  Gestational hypertension without significant   O13.3  proteinuria, third trimester  Poor obstetric history: Previous               O09.299  preeclampsia / eclampsia/gestational HTN  [redacted] weeks gestation of pregnancy  Z3A.34  LR NIPS ---------------------------------------------------------------------- Vital Signs                            Pulse:  97  BP:          136/84  ---------------------------------------------------------------------- Fetal Evaluation  Num Of Fetuses:         1  Fetal Heart Rate(bpm):  153  Cardiac Activity:       Observed  Presentation:           Cephalic  Placenta:               Posterior Fundal  Amniotic Fluid  AFI FV:      Within normal limits  AFI Sum(cm)     %Tile       Largest Pocket(cm)  13.41           45          3.97  RUQ(cm)       RLQ(cm)       LUQ(cm)        LLQ(cm)  3.63          3.4           2.41           3.97 ---------------------------------------------------------------------- Biometry  BPD:     89.03  mm     G. Age:  36w 0d         90  %    CI:        78.37   %    70 - 86                                                          FL/HC:      20.7   %    19.4 - 21.8  HC:    318.13   mm     G. Age:  35w 6d         53  %    HC/AC:      1.01        0.96 - 1.11  AC:    314.59   mm     G. Age:  35w 3d         83  %    FL/BPD:     73.9   %    71 - 87  FL:      65.78  mm     G. Age:  33w 6d         30  %    FL/AC:      20.9   %    20 - 24  Est. FW:    2592  gm    5 lb 11 oz      69  % ---------------------------------------------------------------------- OB History  Gravidity:    2  Living:       1 ---------------------------------------------------------------------- Gestational Age  LMP:           34w 2d        Date:  06/18/21                 EDD:   03/25/22  U/S Today:     35w 2d  EDD:   03/18/22  Best:          34w 2d     Det. By:  LMP  (06/18/21)          EDD:   03/25/22 ---------------------------------------------------------------------- Anatomy  Cranium:               Appears normal         Aortic Arch:            Previously seen  Cavum:                 Appears normal         Ductal Arch:            Previously seen  Ventricles:            Appears normal         Diaphragm:              Appears normal  Choroid Plexus:        Previously seen        Stomach:                Appears normal, left                                                                         sided  Cerebellum:            Previously seen        Abdomen:                Appears normal  Posterior Fossa:       Previously seen        Abdominal Wall:         Previously seen  Nuchal Fold:           Previously seen        Cord Vessels:           Previously seen  Face:                  Profile nl; orbits     Kidneys:                Appear normal                         prev visualized  Lips:                  Previously seen        Bladder:                Appears normal  Thoracic:              Previously seen        Spine:                  Previously seen  Heart:                 Previously seen        Upper Extremities:      Previously seen  RVOT:                  Previously seen  Lower Extremities:      Previously seen  LVOT:                  Previously seen  Other:  Fetus prev appears to be female. Nasal bone, lenses, maxilla,          mandible and falx prev visualized. Hands and feet prev visualized. ---------------------------------------------------------------------- Comments  This patient was seen for a follow up growth scan due to  recently diagnosed gestational hypertension.  The patient's  blood pressure today was 136/84.  She was informed that the fetal growth and amniotic fluid  level appears appropriate for her gestational age.  Fetal movements and fetal breathing movements were noted  throughout today's exam.  Preeclampsia precautions were reviewed.  Due to gestational hypertension, we will continue to follow her  with weekly fetal testing until delivery.  Delivery should probably occur at between 37 to 38 weeks.  Another BPP was scheduled in 1 week. ----------------------------------------------------------------------                   Johnell Comings, MD Electronically Signed Final Report   02/13/2022 04:12 pm ----------------------------------------------------------------------   MAU Course  Procedures  MDM -preeclampsia evaluation  without severe range BP in MAU on admission -elevated BP of 145/88 at highest -symptoms include: HA, on-going white spots -UA: hazy/trace leuks/rare bacteria, sending urine for culture -CBC: H/H 12.2/35.2, platelets 200 -CMP: serum creatinine 0.63, AST/ALT 19/17 -PCr: 0.12 -EFM: reactive       -baseline: 140       -variability: moderate       -accels: present, 15x15       -decels: absent       -TOCO: few ctx, pt not feeling -per Dr. Damita Dunnings, pt OK to be discharged home to wait for induction on Tuesday -pt discharged to home in stable condition  Orders Placed This Encounter  Procedures   Culture, OB Urine    Standing Status:   Standing    Number of Occurrences:   1   Urinalysis, Routine w reflex microscopic Urine, Clean Catch    Standing Status:   Standing    Number of Occurrences:   1   CBC with Differential/Platelet    Standing Status:   Standing    Number of Occurrences:   1   Comprehensive metabolic panel    Standing Status:   Standing    Number of Occurrences:   1   Protein / creatinine ratio, urine    Standing Status:   Standing    Number of Occurrences:   1   No orders of the defined types were placed in this encounter.  Assessment and Plan   1. Gestational hypertension, third trimester   2. Supervision of other high risk pregnancy, antepartum, unspecified trimester   3. History of gestational hypertension   4. [redacted] weeks gestation of pregnancy   5. NST (non-stress test) reactive     Allergies as of 03/02/2022       Reactions   Morphine And Related Shortness Of Breath, Other (See Comments)   Headache and "trouble breathing"   Ondansetron Hcl Rash   Zofran Rash   Ketorolac Rash        Medication List     TAKE these medications    aspirin EC 81 MG tablet Take 1 tablet (81 mg total) by mouth daily. Take after 12 weeks for prevention of preeclampsia later in pregnancy   cyclobenzaprine 10 MG tablet Commonly known as: FLEXERIL Take  1 tablet (10 mg  total) by mouth 3 (three) times daily as needed (headache).   famotidine 20 MG tablet Commonly known as: PEPCID Take 1 tablet (20 mg total) by mouth at bedtime.   PRENATAL VITAMINS PO Take by mouth.        -preeclampsia precautions given -return MAU precautions discussed -pt discharged to home in stable condition  Gerrie Nordmann Tiant [place] 03/02/2022, 4:56 PM

## 2022-03-02 NOTE — MAU Note (Signed)
Stephanie Frazier is a 21 y.o. at 44w5dhere in MAU reporting: she's been evaluating BP's @ home and BP's have been above 140/90. Reports currently has slight H/A and occasional white spots.  Denies epigastric pain.  Denies VB or LOF.  Endorses +FM.   Onset of complaint: today Pain score: 2/10 Vitals:   03/02/22 1522  BP: (!) 145/88  Pulse: (!) 116  Resp: 18  Temp: 97.9 F (36.6 C)  SpO2: 98%     FGTX:MIWOEHOZsecondary maternal apparel Lab orders placed from triage:   UA

## 2022-03-04 ENCOUNTER — Other Ambulatory Visit: Payer: Self-pay

## 2022-03-04 LAB — CULTURE, OB URINE: Culture: 60000 — AB

## 2022-03-05 ENCOUNTER — Inpatient Hospital Stay (HOSPITAL_COMMUNITY): Payer: Medicaid Other

## 2022-03-05 ENCOUNTER — Other Ambulatory Visit: Payer: Self-pay

## 2022-03-05 ENCOUNTER — Inpatient Hospital Stay (HOSPITAL_COMMUNITY): Payer: Medicaid Other | Admitting: Anesthesiology

## 2022-03-05 ENCOUNTER — Encounter (HOSPITAL_COMMUNITY): Payer: Self-pay | Admitting: Family Medicine

## 2022-03-05 ENCOUNTER — Inpatient Hospital Stay (HOSPITAL_COMMUNITY)
Admission: AD | Admit: 2022-03-05 | Discharge: 2022-03-07 | DRG: 798 | Disposition: A | Discharge status: Home / Self Care | Payer: Medicaid Other | Attending: Family Medicine | Admitting: Family Medicine

## 2022-03-05 ENCOUNTER — Encounter (HOSPITAL_COMMUNITY)
Admission: AD | Disposition: A | Discharge status: Home / Self Care | Payer: Self-pay | Source: Home / Self Care | Attending: Family Medicine

## 2022-03-05 DIAGNOSIS — Z3A37 37 weeks gestation of pregnancy: Secondary | ICD-10-CM

## 2022-03-05 DIAGNOSIS — O9902 Anemia complicating childbirth: Secondary | ICD-10-CM | POA: Diagnosis present

## 2022-03-05 DIAGNOSIS — M25519 Pain in unspecified shoulder: Secondary | ICD-10-CM | POA: Diagnosis not present

## 2022-03-05 DIAGNOSIS — O99214 Obesity complicating childbirth: Secondary | ICD-10-CM | POA: Diagnosis present

## 2022-03-05 DIAGNOSIS — Z302 Encounter for sterilization: Secondary | ICD-10-CM

## 2022-03-05 DIAGNOSIS — D509 Iron deficiency anemia, unspecified: Secondary | ICD-10-CM | POA: Diagnosis present

## 2022-03-05 DIAGNOSIS — O134 Gestational [pregnancy-induced] hypertension without significant proteinuria, complicating childbirth: Secondary | ICD-10-CM | POA: Diagnosis present

## 2022-03-05 DIAGNOSIS — O99893 Other specified diseases and conditions complicating puerperium: Secondary | ICD-10-CM | POA: Diagnosis not present

## 2022-03-05 DIAGNOSIS — O133 Gestational [pregnancy-induced] hypertension without significant proteinuria, third trimester: Secondary | ICD-10-CM

## 2022-03-05 DIAGNOSIS — O139 Gestational [pregnancy-induced] hypertension without significant proteinuria, unspecified trimester: Secondary | ICD-10-CM | POA: Diagnosis present

## 2022-03-05 HISTORY — PX: TUBAL LIGATION: SHX77

## 2022-03-05 LAB — PROTEIN / CREATININE RATIO, URINE
Creatinine, Urine: 81 mg/dL
Protein Creatinine Ratio: 0.09 mg/mg{Cre} (ref 0.00–0.15)
Total Protein, Urine: 7 mg/dL

## 2022-03-05 LAB — COMPREHENSIVE METABOLIC PANEL WITH GFR
ALT: 16 U/L (ref 0–44)
AST: 18 U/L (ref 15–41)
Albumin: 2.4 g/dL — ABNORMAL LOW (ref 3.5–5.0)
Alkaline Phosphatase: 106 U/L (ref 38–126)
Anion gap: 6 (ref 5–15)
BUN: 15 mg/dL (ref 6–20)
CO2: 23 mmol/L (ref 22–32)
Calcium: 9.7 mg/dL (ref 8.9–10.3)
Chloride: 106 mmol/L (ref 98–111)
Creatinine, Ser: 0.72 mg/dL (ref 0.44–1.00)
GFR, Estimated: >60 mL/min
Glucose, Bld: 87 mg/dL (ref 70–99)
Potassium: 4.1 mmol/L (ref 3.5–5.1)
Sodium: 135 mmol/L (ref 135–145)
Total Bilirubin: <0.1 mg/dL — ABNORMAL LOW (ref 0.3–1.2)
Total Protein: 5.9 g/dL — ABNORMAL LOW (ref 6.5–8.1)

## 2022-03-05 LAB — CBC
HCT: 33.4 % — ABNORMAL LOW (ref 36.0–46.0)
HCT: 32.9 % — ABNORMAL LOW (ref 36.0–46.0)
Hemoglobin: 11.3 g/dL — ABNORMAL LOW (ref 12.0–15.0)
Hemoglobin: 11.2 g/dL — ABNORMAL LOW (ref 12.0–15.0)
MCH: 30.7 pg (ref 26.0–34.0)
MCH: 30.6 pg (ref 26.0–34.0)
MCHC: 33.8 g/dL (ref 30.0–36.0)
MCHC: 34 g/dL (ref 30.0–36.0)
MCV: 90.8 fL (ref 80.0–100.0)
MCV: 89.9 fL (ref 80.0–100.0)
Platelets: 184 10*3/uL (ref 150–400)
Platelets: 171 10*3/uL (ref 150–400)
RBC: 3.68 MIL/uL — ABNORMAL LOW (ref 3.87–5.11)
RBC: 3.66 MIL/uL — ABNORMAL LOW (ref 3.87–5.11)
RDW: 13.7 % (ref 11.5–15.5)
RDW: 13.7 % (ref 11.5–15.5)
WBC: 9.5 10*3/uL (ref 4.0–10.5)
WBC: 8.9 10*3/uL (ref 4.0–10.5)
nRBC: 0 % (ref 0.0–0.2)
nRBC: 0 % (ref 0.0–0.2)

## 2022-03-05 LAB — RPR: RPR Ser Ql: NONREACTIVE

## 2022-03-05 LAB — TYPE AND SCREEN
ABO/RH(D): O POS
Antibody Screen: NEGATIVE

## 2022-03-05 SURGERY — LIGATION, FALLOPIAN TUBE, POSTPARTUM
Anesthesia: Epidural | Wound class: Clean Contaminated

## 2022-03-05 SURGERY — LIGATION, FALLOPIAN TUBE, POSTPARTUM
Anesthesia: Choice | Laterality: Bilateral

## 2022-03-05 MED ORDER — TETANUS-DIPHTH-ACELL PERTUSSIS 5-2.5-18.5 LF-MCG/0.5 IM SUSY: 0.5000 mL | PREFILLED_SYRINGE | Freq: Once | INTRAMUSCULAR | Status: DC

## 2022-03-05 MED ORDER — SUCCINYLCHOLINE CHLORIDE 200 MG/10ML IV SOSY
PREFILLED_SYRINGE | INTRAVENOUS | Status: AC
  Filled 2022-03-05: qty 10

## 2022-03-05 MED ORDER — LACTATED RINGERS IV SOLN: INTRAVENOUS | Status: DC

## 2022-03-05 MED ORDER — ACETAMINOPHEN 325 MG PO TABS
650.0000 mg | ORAL_TABLET | ORAL | Status: DC | PRN
  Administered 2022-03-05: 650 mg via ORAL
  Filled 2022-03-05: qty 2

## 2022-03-05 MED ORDER — FENTANYL CITRATE (PF) 100 MCG/2ML IJ SOLN
INTRAMUSCULAR | Status: DC | PRN
Start: 2022-03-05 — End: 2022-03-05
  Administered 2022-03-05: 100 ug via EPIDURAL

## 2022-03-05 MED ORDER — COCONUT OIL OIL: 1.0000 | TOPICAL_OIL | Status: DC | PRN

## 2022-03-05 MED ORDER — PHENYLEPHRINE 80 MCG/ML (10ML) SYRINGE FOR IV PUSH (FOR BLOOD PRESSURE SUPPORT)
80.0000 ug | PREFILLED_SYRINGE | INTRAVENOUS | Status: DC | PRN
  Filled 2022-03-05: qty 10

## 2022-03-05 MED ORDER — FENTANYL-BUPIVACAINE-NACL 0.5-0.125-0.9 MG/250ML-% EP SOLN
EPIDURAL | Status: DC | PRN
  Administered 2022-03-05: 12 mL/h via EPIDURAL

## 2022-03-05 MED ORDER — BENZOCAINE-MENTHOL 20-0.5 % EX AERO
1.0000 | INHALATION_SPRAY | CUTANEOUS | Status: DC | PRN
Start: 2022-03-05 — End: 2022-03-07

## 2022-03-05 MED ORDER — LIDOCAINE-EPINEPHRINE (PF) 2 %-1:200000 IJ SOLN
INTRAMUSCULAR | Status: DC | PRN
  Administered 2022-03-05: 7 mL via EPIDURAL
  Administered 2022-03-05: 5 mL via EPIDURAL
  Administered 2022-03-05: 3 mL via EPIDURAL

## 2022-03-05 MED ORDER — OXYCODONE-ACETAMINOPHEN 5-325 MG PO TABS: 2.0000 | ORAL_TABLET | ORAL | Status: DC | PRN

## 2022-03-05 MED ORDER — WITCH HAZEL-GLYCERIN EX PADS: 1.0000 | MEDICATED_PAD | CUTANEOUS | Status: DC | PRN

## 2022-03-05 MED ORDER — OXYTOCIN-SODIUM CHLORIDE 30-0.9 UT/500ML-% IV SOLN
1.0000 m[IU]/min | INTRAVENOUS | Status: DC
  Administered 2022-03-05: 2 m[IU]/min via INTRAVENOUS
  Filled 2022-03-05: qty 500

## 2022-03-05 MED ORDER — SENNOSIDES-DOCUSATE SODIUM 8.6-50 MG PO TABS
2.0000 | ORAL_TABLET | Freq: Every day | ORAL | Status: DC
  Administered 2022-03-06 – 2022-03-07 (×2): 2 via ORAL
  Filled 2022-03-05 (×2): qty 2

## 2022-03-05 MED ORDER — EPHEDRINE 5 MG/ML INJ: 10.0000 mg | INTRAVENOUS | Status: DC | PRN

## 2022-03-05 MED ORDER — AMISULPRIDE (ANTIEMETIC) 5 MG/2ML IV SOLN: 10.0000 mg | Freq: Once | INTRAVENOUS | Status: DC | PRN

## 2022-03-05 MED ORDER — BUPIVACAINE HCL (PF) 0.25 % IJ SOLN
INTRAMUSCULAR | Status: AC
  Filled 2022-03-05: qty 30

## 2022-03-05 MED ORDER — SCOPOLAMINE 1 MG/3DAYS TD PT72
MEDICATED_PATCH | TRANSDERMAL | Status: DC | PRN
  Administered 2022-03-05: 1 via TRANSDERMAL

## 2022-03-05 MED ORDER — PHENYLEPHRINE 80 MCG/ML (10ML) SYRINGE FOR IV PUSH (FOR BLOOD PRESSURE SUPPORT): 80.0000 ug | PREFILLED_SYRINGE | INTRAVENOUS | Status: DC | PRN

## 2022-03-05 MED ORDER — OXYCODONE HCL 5 MG/5ML PO SOLN: 5.0000 mg | Freq: Once | ORAL | Status: DC | PRN

## 2022-03-05 MED ORDER — FLEET ENEMA 7-19 GM/118ML RE ENEM: 1.0000 | ENEMA | RECTAL | Status: DC | PRN

## 2022-03-05 MED ORDER — PRENATAL MULTIVITAMIN CH
1.0000 | ORAL_TABLET | Freq: Every day | ORAL | Status: DC
  Administered 2022-03-06: 1 via ORAL
  Filled 2022-03-05: qty 1

## 2022-03-05 MED ORDER — PROPOFOL 10 MG/ML IV BOLUS
INTRAVENOUS | Status: DC | PRN
  Administered 2022-03-05: 150 mg via INTRAVENOUS

## 2022-03-05 MED ORDER — NIFEDIPINE ER OSMOTIC RELEASE 30 MG PO TB24
30.0000 mg | ORAL_TABLET | Freq: Every day | ORAL | Status: DC
  Administered 2022-03-05 – 2022-03-07 (×3): 30 mg via ORAL
  Filled 2022-03-05 (×3): qty 1

## 2022-03-05 MED ORDER — OXYCODONE-ACETAMINOPHEN 5-325 MG PO TABS: 1.0000 | ORAL_TABLET | ORAL | Status: DC | PRN

## 2022-03-05 MED ORDER — LIDOCAINE-EPINEPHRINE (PF) 2 %-1:200000 IJ SOLN
INTRAMUSCULAR | Status: AC
  Filled 2022-03-05: qty 20

## 2022-03-05 MED ORDER — LIDOCAINE HCL (CARDIAC) PF 100 MG/5ML IV SOSY
PREFILLED_SYRINGE | INTRAVENOUS | Status: DC | PRN
  Administered 2022-03-05: 60 mg via INTRAVENOUS

## 2022-03-05 MED ORDER — DIPHENHYDRAMINE HCL 25 MG PO CAPS: 25.0000 mg | ORAL_CAPSULE | Freq: Four times a day (QID) | ORAL | Status: DC | PRN

## 2022-03-05 MED ORDER — OXYCODONE HCL 5 MG PO TABS: 5.0000 mg | ORAL_TABLET | Freq: Once | ORAL | Status: DC | PRN

## 2022-03-05 MED ORDER — DEXMEDETOMIDINE (PRECEDEX) IN NS 20 MCG/5ML (4 MCG/ML) IV SYRINGE
PREFILLED_SYRINGE | INTRAVENOUS | Status: DC | PRN
  Administered 2022-03-05: 12 ug via INTRAVENOUS

## 2022-03-05 MED ORDER — FENTANYL-BUPIVACAINE-NACL 0.5-0.125-0.9 MG/250ML-% EP SOLN
12.0000 mL/h | EPIDURAL | Status: DC | PRN
  Filled 2022-03-05: qty 250

## 2022-03-05 MED ORDER — SUCCINYLCHOLINE CHLORIDE 200 MG/10ML IV SOSY
PREFILLED_SYRINGE | INTRAVENOUS | Status: DC | PRN
  Administered 2022-03-05: 140 mg via INTRAVENOUS

## 2022-03-05 MED ORDER — PROMETHAZINE HCL 25 MG/ML IJ SOLN: 6.2500 mg | INTRAMUSCULAR | Status: DC | PRN

## 2022-03-05 MED ORDER — FENTANYL CITRATE (PF) 100 MCG/2ML IJ SOLN
INTRAMUSCULAR | Status: AC
  Filled 2022-03-05: qty 2

## 2022-03-05 MED ORDER — OXYTOCIN BOLUS FROM INFUSION
333.0000 mL | Freq: Once | INTRAVENOUS | Status: AC
  Administered 2022-03-05: 333 mL via INTRAVENOUS

## 2022-03-05 MED ORDER — BUPIVACAINE HCL (PF) 0.25 % IJ SOLN
INTRAMUSCULAR | Status: DC | PRN
  Administered 2022-03-05: 20 mL
  Administered 2022-03-05: 10 mL

## 2022-03-05 MED ORDER — MIDAZOLAM HCL 5 MG/5ML IJ SOLN
INTRAMUSCULAR | Status: DC | PRN
  Administered 2022-03-05: 2 mg via INTRAVENOUS

## 2022-03-05 MED ORDER — OXYTOCIN-SODIUM CHLORIDE 30-0.9 UT/500ML-% IV SOLN
2.5000 [IU]/h | INTRAVENOUS | Status: DC
  Administered 2022-03-05: 41.7 m[IU]/min via INTRAVENOUS

## 2022-03-05 MED ORDER — SIMETHICONE 80 MG PO CHEW: 80.0000 mg | CHEWABLE_TABLET | ORAL | Status: DC | PRN

## 2022-03-05 MED ORDER — FUROSEMIDE 20 MG PO TABS
20.0000 mg | ORAL_TABLET | Freq: Every day | ORAL | Status: DC
  Administered 2022-03-05 – 2022-03-07 (×3): 20 mg via ORAL
  Filled 2022-03-05 (×3): qty 1

## 2022-03-05 MED ORDER — DIPHENHYDRAMINE HCL 50 MG/ML IJ SOLN: 12.5000 mg | INTRAMUSCULAR | Status: DC | PRN

## 2022-03-05 MED ORDER — OXYCODONE-ACETAMINOPHEN 5-325 MG PO TABS
1.0000 | ORAL_TABLET | ORAL | Status: DC | PRN
  Administered 2022-03-06 – 2022-03-07 (×4): 1 via ORAL
  Filled 2022-03-05 (×4): qty 1

## 2022-03-05 MED ORDER — LIDOCAINE HCL (PF) 1 % IJ SOLN
30.0000 mL | INTRAMUSCULAR | Status: DC | PRN
Start: 2022-03-05 — End: 2022-03-05

## 2022-03-05 MED ORDER — MISOPROSTOL 25 MCG QUARTER TABLET
25.0000 ug | ORAL_TABLET | Freq: Once | ORAL | Status: DC
  Filled 2022-03-05: qty 1

## 2022-03-05 MED ORDER — LACTATED RINGERS IV SOLN: INTRAVENOUS | Status: DC | PRN

## 2022-03-05 MED ORDER — DEXAMETHASONE SODIUM PHOSPHATE 4 MG/ML IJ SOLN
INTRAMUSCULAR | Status: DC | PRN
  Administered 2022-03-05: 4 mg via INTRAVENOUS

## 2022-03-05 MED ORDER — SOD CITRATE-CITRIC ACID 500-334 MG/5ML PO SOLN
30.0000 mL | ORAL | Status: DC | PRN
  Administered 2022-03-05: 30 mL via ORAL
  Filled 2022-03-05: qty 30

## 2022-03-05 MED ORDER — SCOPOLAMINE 1 MG/3DAYS TD PT72
MEDICATED_PATCH | TRANSDERMAL | Status: AC
  Filled 2022-03-05: qty 1

## 2022-03-05 MED ORDER — DIBUCAINE (PERIANAL) 1 % EX OINT: 1.0000 | TOPICAL_OINTMENT | CUTANEOUS | Status: DC | PRN

## 2022-03-05 MED ORDER — LACTATED RINGERS IV SOLN: 500.0000 mL | Freq: Once | INTRAVENOUS | Status: DC

## 2022-03-05 MED ORDER — LACTATED RINGERS IV SOLN: 500.0000 mL | INTRAVENOUS | Status: DC | PRN

## 2022-03-05 MED ORDER — MIDAZOLAM HCL 2 MG/2ML IJ SOLN
INTRAMUSCULAR | Status: AC
  Filled 2022-03-05: qty 2

## 2022-03-05 MED ORDER — FENTANYL CITRATE (PF) 100 MCG/2ML IJ SOLN
INTRAMUSCULAR | Status: DC | PRN
  Administered 2022-03-05: 100 ug via INTRAVENOUS

## 2022-03-05 MED ORDER — PROPOFOL 10 MG/ML IV BOLUS
INTRAVENOUS | Status: AC
  Filled 2022-03-05: qty 20

## 2022-03-05 MED ORDER — HYDROMORPHONE HCL 1 MG/ML IJ SOLN: 0.2500 mg | INTRAMUSCULAR | Status: DC | PRN

## 2022-03-05 MED ORDER — IBUPROFEN 600 MG PO TABS
600.0000 mg | ORAL_TABLET | Freq: Four times a day (QID) | ORAL | Status: DC
  Administered 2022-03-05 – 2022-03-07 (×7): 600 mg via ORAL
  Filled 2022-03-05 (×7): qty 1

## 2022-03-05 MED ORDER — LIDOCAINE HCL (PF) 1 % IJ SOLN
INTRAMUSCULAR | Status: DC | PRN
  Administered 2022-03-05: 2 mL via EPIDURAL
  Administered 2022-03-05: 10 mL via EPIDURAL

## 2022-03-05 MED ORDER — MEPERIDINE HCL 25 MG/ML IJ SOLN: 6.2500 mg | INTRAMUSCULAR | Status: DC | PRN

## 2022-03-05 MED ORDER — TERBUTALINE SULFATE 1 MG/ML IJ SOLN: 0.2500 mg | Freq: Once | INTRAMUSCULAR | Status: DC | PRN

## 2022-03-05 MED ORDER — LIDOCAINE 2% (20 MG/ML) 5 ML SYRINGE
INTRAMUSCULAR | Status: AC
  Filled 2022-03-05: qty 5

## 2022-03-05 SURGICAL SUPPLY — 23 items
BLADE SURG 11 STRL SS (BLADE) ×2 IMPLANT
DERMABOND ADVANCED (GAUZE/BANDAGES/DRESSINGS) ×1
DERMABOND ADVANCED .7 DNX12 (GAUZE/BANDAGES/DRESSINGS) IMPLANT
DRSG OPSITE POSTOP 3X4 (GAUZE/BANDAGES/DRESSINGS) ×2 IMPLANT
DURAPREP 26ML APPLICATOR (WOUND CARE) ×2 IMPLANT
GLOVE BIOGEL PI IND STRL 7.0 (GLOVE) ×1 IMPLANT
GLOVE BIOGEL PI IND STRL 7.5 (GLOVE) ×1 IMPLANT
GLOVE BIOGEL PI INDICATOR 7.0 (GLOVE) ×1
GLOVE BIOGEL PI INDICATOR 7.5 (GLOVE) ×1
GLOVE ECLIPSE 7.5 STRL STRAW (GLOVE) ×2 IMPLANT
GOWN STRL REUS W/TWL LRG LVL3 (GOWN DISPOSABLE) ×4 IMPLANT
HIBICLENS CHG 4% 4OZ BTL (MISCELLANEOUS) ×2 IMPLANT
LIGASURE IMPACT 36 18CM CVD LR (INSTRUMENTS) ×1 IMPLANT
NEEDLE HYPO 22GX1.5 SAFETY (NEEDLE) ×2 IMPLANT
NS IRRIG 1000ML POUR BTL (IV SOLUTION) ×2 IMPLANT
PACK ABDOMINAL MINOR (CUSTOM PROCEDURE TRAY) ×2 IMPLANT
PROTECTOR NERVE ULNAR (MISCELLANEOUS) ×2 IMPLANT
SPONGE LAP 4X18 RFD (DISPOSABLE) IMPLANT
SUT VICRYL 0 UR6 27IN ABS (SUTURE) ×2 IMPLANT
SUT VICRYL 4-0 PS2 18IN ABS (SUTURE) ×2 IMPLANT
SYR CONTROL 10ML LL (SYRINGE) ×2 IMPLANT
TOWEL OR 17X24 6PK STRL BLUE (TOWEL DISPOSABLE) ×4 IMPLANT
TRAY FOLEY W/BAG SLVR 14FR (SET/KITS/TRAYS/PACK) ×2 IMPLANT

## 2022-03-05 NOTE — Progress Notes (Signed)
Labor Progress Note Stephanie Frazier is a 21 y.o. G2P1001 at 60w1dpresented for IOL gHTN  S: Feeling contractions. FB out at 0908  O:  BP 134/79   Pulse 86   Temp 99 F (37.2 C) (Axillary)   Resp 18   Ht '5\' 7"'$  (1.702 m)   Wt 104.9 kg   LMP 06/18/2021   SpO2 98%   BMI 36.23 kg/m  EFM: 150 bpm/Moderate variability/ 15x15 accels/ None decels  CVE: Dilation: 5 Effacement (%): 50, 60 Station: -3 Presentation: Vertex Exam by:: Dr. MClement Husbands  A&P: 89y.o. G2P1001 343w1dIOL gHTN #Labor: Progressing well. Start pitocin. #Pain: ordered epidural #FWB: CAT 1 #GBS negative #gHTN: needs P/C and CMP. BP remains above 130/80, no severe range BP. CTM BP  Tenee Wish Q Mercado-Ortiz, DO 10:12 AM

## 2022-03-05 NOTE — Discharge Summary (Cosign Needed Addendum)
Postpartum Discharge Summary     Patient Name: Stephanie Frazier DOB: 06-Dec-2000 MRN: [id number]  Date of admission: 03/05/2022 Delivery date:03/05/2022  Delivering provider: Wende Mott  Date of discharge: 03/07/2022  Admitting diagnosis: Gestational hypertension [O13.9] Intrauterine pregnancy: [redacted]w[redacted]d     Secondary diagnosis:  Principal Problem:   Gestational hypertension  Additional problems: Iron deficiency anemia    Discharge diagnosis: Term Pregnancy Delivered and Gestational Hypertension                                              Post partum procedures:postpartum tubal ligation Augmentation: AROM, Pitocin, and IP Foley Complications: None  Hospital course: Induction of Labor With Vaginal Delivery   21 y.o. yo G2P1001 at [redacted]w[redacted]d was admitted to the hospital 03/05/2022 for induction of labor.  Indication for induction: Gestational hypertension.  Patient had an uncomplicated labor course as follows: BTL performed PPD0 Membrane Rupture Time/Date: 10:18 AM ,03/05/2022   Delivery Method:Vaginal, Spontaneous  Episiotomy: None  Lacerations:  None  Details of delivery can be found in separate delivery note.  Patient had a routine postpartum course. Patient is discharged home 03/07/22.  Newborn Data: Birth date:03/05/2022  Birth time:2:18 PM  Gender:Female  Living status:Living  Apgars:8 ,9  Weight:3080 g   Magnesium Sulfate received: No BMZ received: No Rhophylac:N/A MMR:N/A T-DaP:Given prenatally Flu: No Transfusion:No  Physical exam  Vitals:   03/06/22 0524 03/06/22 1502 03/06/22 2300 03/07/22 0603  BP: 128/70 127/72 131/72 126/78  Pulse: 70 67 87 72  Resp: $Remo'18 20 18 18  'yvKOg$ Temp: 98 F (36.7 C) 98.3 F (36.8 C) 98.2 F (36.8 C) 97.6 F (36.4 C)  TempSrc: Oral Oral Oral Oral  SpO2: 95% 96% 98% 99%  Weight:      Height:        Labs: Lab Results  Component Value Date   WBC 12.8 (H) 03/06/2022   HGB 10.6 (L) 03/06/2022   HCT 31.1 (L) 03/06/2022   MCV  90.4 03/06/2022   PLT 168 03/06/2022      Latest Ref Rng & Units 03/05/2022    7:56 AM  CMP  Glucose 70 - 99 mg/dL 87   BUN 6 - 20 mg/dL 15   Creatinine 0.44 - 1.00 mg/dL 0.72   Sodium 135 - 145 mmol/L 135   Potassium 3.5 - 5.1 mmol/L 4.1   Chloride 98 - 111 mmol/L 106   CO2 22 - 32 mmol/L 23   Calcium 8.9 - 10.3 mg/dL 9.7   Total Protein 6.5 - 8.1 g/dL 5.9   Total Bilirubin 0.3 - 1.2 mg/dL <0.1   Alkaline Phos 38 - 126 U/L 106   AST 15 - 41 U/L 18   ALT 0 - 44 U/L 16    Edinburgh Score:    03/06/2022   11:53 PM  Edinburgh Postnatal Depression Scale Screening Tool  I have been able to laugh and see the funny side of things. 0  I have looked forward with enjoyment to things. 0  I have blamed myself unnecessarily when things went wrong. 2  I have been anxious or worried for no good reason. 2  I have felt scared or panicky for no good reason. 1  Things have been getting on top of me. 0  I have been so unhappy that I have had difficulty sleeping. 0  I have  felt sad or miserable. 1  I have been so unhappy that I have been crying. 0  The thought of harming myself has occurred to me. 0  Edinburgh Postnatal Depression Scale Total 6     After visit meds:  Allergies as of 03/07/2022       Reactions   Morphine And Related Shortness Of Breath, Nausea And Vomiting, Other (See Comments)   Headache and "trouble breathing". Headaches   Ondansetron Hcl Hives, Rash   Ketorolac Rash        Medication List     STOP taking these medications    aspirin EC 81 MG tablet   cyclobenzaprine 10 MG tablet Commonly known as: FLEXERIL   famotidine 20 MG tablet Commonly known as: PEPCID   TUMS CHEWY BITES PO       TAKE these medications    furosemide 20 MG tablet Commonly known as: LASIX Take 1 tablet (20 mg total) by mouth daily.   ibuprofen 600 MG tablet Commonly known as: ADVIL Take 1 tablet (600 mg total) by mouth every 6 (six) hours as needed.   NIFEdipine 30 MG 24  hr tablet Commonly known as: ADALAT CC Take 1 tablet (30 mg total) by mouth daily.   oxyCODONE-acetaminophen 5-325 MG tablet Commonly known as: PERCOCET/ROXICET Take 1 tablet by mouth every 4 (four) hours as needed for moderate pain.   PRENATAL VITAMINS PO Take 2 tablets by mouth daily.               Discharge Care Instructions  (From admission, onward)           Start     Ordered   03/07/22 0000  If the dressing is still on your incision site when you go home, remove it on the third day after your surgery date. Remove dressing if it begins to fall off, or if it is dirty or damaged before the third day.        03/07/22 0913             Discharge home in stable condition Infant Feeding: Breast Infant Disposition:home with mother Discharge instruction: per After Visit Summary and Postpartum booklet. Activity: Advance as tolerated. Pelvic rest for 6 weeks.  Diet: routine diet Future Appointments: Future Appointments  Date Time Provider [place]  03/12/2022  2:30 PM [facility] NURSE [facility]  04/12/2022 10:55 AM Griffin Basil, MD [facility]  06/07/2022  3:40 PM Freada Bergeron, MD [facility] None   Follow up Visit:  Please schedule this patient for Postpartum visit in: 1 week for BP check and 4 weeks with the following provider: Any provider In-Person For C/S patients schedule nurse incision check in weeks 2 weeks: no Low risk pregnancy complicated by: gHTN Delivery mode:  SVD Anticipated Birth Control:  BTL done PP PP Procedures needed: BP check and consider recheck of Hgb  03/07/2022 Alain Marion, MD  CNM attestation I have seen and examined this patient and agree with above documentation in the resident's note.   Stephanie Frazier is a 21 y.o. [id number] s/p vag del and ppBTL.   Pain is well controlled.  Plan for birth control is bilateral tubal ligation.  Method of Feeding: both  PE:  BP 126/78 (BP Location: Left Arm)   Pulse 72   Temp  97.6 F (36.4 C) (Oral)   Resp 18   Ht $R'5\' 7"'Bt$  (1.702 m)   Wt 104.9 kg   LMP 06/18/2021   SpO2 99%  Breastfeeding Unknown   BMI 36.23 kg/m  Fundus firm  Recent Labs    03/06/22 0417  HGB 10.6*  HCT 31.1*     Plan: discharge today with Lasix and Procardia - postpartum care discussed - f/u clinic in 1wk for BP check then in 4-6 weeks for postpartum visit  Myrtis Ser, CNM 9:27 PM

## 2022-03-05 NOTE — H&P (Signed)
OBSTETRIC ADMISSION HISTORY AND PHYSICAL  Stephanie Frazier is a 21 y.o. female G2P1001 with IUP at 5w1dby LMP presenting for IOL for gHTN. She reports +FMs, No LOF, no VB, no blurry vision, headaches or peripheral edema, and RUQ pain.  She plans on breast feeding. She request tubal ligation for birth control. [facility]  Dating: By LMP --->  Estimated Date of Delivery: 03/25/22  Sono:    '@[redacted]w[redacted]d'$ , CWD, normal anatomy, cephalic presentation, 28413g, 69% EFW   Prenatal History/Complications:  -Gestational hypertension -Depression/anxiety -Hx of seizures  Past Medical History: Past Medical History:  Diagnosis Date   Allergy    Anxiety    Depression    Hypertension    Hypertension    Migraine    Migraines    Movement disorder    Seizures ([place]    last seizure in 2009    Past Surgical History: Past Surgical History:  Procedure Laterality Date   BUNIONECTOMY Right 07/23/2016   BUNIONECTOMY Left 02/2016   FOOT SURGERY     WISDOM TOOTH EXTRACTION      Obstetrical History: OB History     Gravida  2   Para  1   Term  1   Preterm      AB      Living  1      SAB      IAB      Ectopic      Multiple  0   Live Births  1           Social History Social History   Socioeconomic History   Marital status: DSoil scientist   Spouse name: Not on file   Number of children: Not on file   Years of education: Not on file   Highest education level: Not on file  Occupational History   Not on file  Tobacco Use   Smoking status: Never    Passive exposure: Yes   Smokeless tobacco: Never   Tobacco comments:    Parents smoke outside  Vaping Use   Vaping Use: Never used  Substance and Sexual Activity   Alcohol use: No    Comment: not while preg   Drug use: No   Sexual activity: Yes  Other Topics Concern   Not on file  Social History Narrative   Not on file   Social Determinants of Health   Financial Resource Strain: Not  on file  Food Insecurity: No Food Insecurity (02/28/2022)   Hunger Vital Sign    Worried About Running Out of Food in the Last Year: Never true    Ran Out of Food in the Last Year: Never true  Transportation Needs: No Transportation Needs (02/28/2022)   PRAPARE - THydrologist(Medical): No    Lack of Transportation (Non-Medical): No  Physical Activity: Not on file  Stress: Not on file  Social Connections: Not on file    Family History: Family History  Problem Relation Age of Onset   Seizures Mother    Depression Mother    Asthma Sister    ADD / ADHD Brother    Depression Brother    Migraines Maternal Aunt    Depression Maternal Aunt    Migraines Maternal Uncle    Hypertension Maternal Grandmother    Migraines Maternal Grandmother    Depression Maternal Grandmother    Heart disease Maternal Grandfather    ADD / ADHD Cousin  Many Maternal 1st Cousins have [facility]   Cancer Other    Stroke Neg Hx     Allergies: Allergies  Allergen Reactions   Morphine And Related Shortness Of Breath and Other (See Comments)    Headache and "trouble breathing"   Ondansetron Hcl Rash   Ketorolac Rash    Medications Prior to Admission  Medication Sig Dispense Refill Last Dose   aspirin EC 81 MG tablet Take 1 tablet (81 mg total) by mouth daily. Take after 12 weeks for prevention of preeclampsia later in pregnancy 300 tablet 2 03/04/2022   cyclobenzaprine (FLEXERIL) 10 MG tablet Take 1 tablet (10 mg total) by mouth 3 (three) times daily as needed (headache). 30 tablet 2 Past Week   Prenatal Vit-Fe Fumarate-FA (PRENATAL VITAMINS PO) Take by mouth.   03/04/2022   famotidine (PEPCID) 20 MG tablet Take 1 tablet (20 mg total) by mouth at bedtime. (Patient not taking: Reported on 02/27/2022) 30 tablet 0      Review of Systems   All systems reviewed and negative except as stated in HPI  Blood pressure (!) 149/91, pulse 79, temperature 98.2 F (36.8 C), temperature  source Oral, resp. rate 18, height '5\' 7"'$  (1.702 m), weight 104.9 kg, last menstrual period 06/18/2021, SpO2 97 %, currently breastfeeding. General appearance: alert and no distress Lungs: normal effort Heart: regular rate noted Abdomen: soft, non-tender; bowel sounds normal Pelvic: 2/60/-2 Extremities: No LE edema Presentation: cephalic Fetal monitoringBaseline: 160 bpm, Variability: Good {> 6 bpm), Accelerations: Reactive, and Decelerations: Absent Uterine activityNone     Prenatal labs: ABO, Rh: --/--/PENDING (08/15 0011) Antibody: PENDING (08/15 0011) Rubella: 2.11 (03/09 0000) RPR: Non Reactive (06/08 0847)  HBsAg: Negative (03/09 0000)  HIV: Non Reactive (06/08 0847)  GBS: Negative/-- (08/10 1617)  1 hr Glucola 130 Genetic screening normal Anatomy US normal  Prenatal Transfer Tool  Maternal Diabetes: No Genetic Screening: Normal Maternal Ultrasounds/Referrals: Normal Fetal Ultrasounds or other Referrals:  None Maternal Substance Abuse:  No Significant Maternal Medications:  None Significant Maternal Lab Results: None  Results for orders placed or performed during the hospital encounter of 03/05/22 (from the past 24 hour(s))  Type and screen   Collection Time: 03/05/22 12:11 AM  Result Value Ref Range   ABO/RH(D) PENDING    Antibody Screen PENDING    Sample Expiration      03/08/2022,2359 Performed at [facility], [address] [place], [place] [address]     Patient Active Problem List   Diagnosis Date Noted   Anxiety 02/14/2022   Neuropathy 02/14/2022   History of seizures 02/14/2022   History of gestational hypertension 11/01/2021   Supervision of other high risk pregnancy, antepartum, unspecified trimester 09/27/2021   Gestational hypertension 02/29/2020   Severe recurrent major depression with psychotic features ([place]) 08/02/2017   Nephrolithiasis    Flank pain, acute 02/12/2017   Migraine without aura and with status migrainosus, not  intractable 07/20/2015   Episodic tension-type headache, not intractable 07/20/2015   Mood disorder ([place]) 07/20/2015   Insomnia 07/20/2015   ADHD (attention deficit hyperactivity disorder) 04/09/2013    Assessment/Plan:  Stephanie Frazier is a 67 y.o. G2P1001 at 64w1dhere for IOL for gHTN.  #Labor: FB placed. Consider pitocin when FB out.  #Pain: Planning for epidural #FWB: Cat I #ID: GBS negative #MOF: Breast #MOC: Tubal ligation? #Circ:  N/A, girl  AElane Fritz Medical Student  03/05/2022, 12:57 AM  I was personally present and re-performed the exam and MDM and  verified the service and findings are accurately documented in the student's note. Tyffani Foglesong Autry-Lott, DO 03/05/22 6:16 AM

## 2022-03-05 NOTE — Anesthesia Preprocedure Evaluation (Signed)
Anesthesia Evaluation  Patient identified by MRN, date of birth, ID band Patient awake    Reviewed: Allergy & Precautions, Patient's Chart, lab work & pertinent test results  Airway Mallampati: II  TM Distance: >3 FB Neck ROM: Full    Dental no notable dental hx.    Pulmonary neg pulmonary ROS,    Pulmonary exam normal breath sounds clear to auscultation       Cardiovascular hypertension, Normal cardiovascular exam Rhythm:Regular Rate:Normal     Neuro/Psych  Headaches, Seizures -, Well Controlled,  PSYCHIATRIC DISORDERS Anxiety Depression    GI/Hepatic Neg liver ROS, GERD  Medicated and Controlled,  Endo/Other  Obesity BMI 36  Renal/GU negative Renal ROS  negative genitourinary   Musculoskeletal negative musculoskeletal ROS (+)   Abdominal (+) + obese,   Peds negative pediatric ROS (+)  Hematology negative hematology ROS (+)   Anesthesia Other Findings   Reproductive/Obstetrics (+) Pregnancy                             Anesthesia Physical Anesthesia Plan  ASA: 2  Anesthesia Plan: Epidural   Post-op Pain Management: Ofirmev IV (intra-op)*   Induction:   PONV Risk Score and Plan: 2  Airway Management Planned: Natural Airway  Additional Equipment: None  Intra-op Plan:   Post-operative Plan:   Informed Consent: I have reviewed the patients History and Physical, chart, labs and discussed the procedure including the risks, benefits and alternatives for the proposed anesthesia with the patient or authorized representative who has indicated his/her understanding and acceptance.       Plan Discussed with: CRNA  Anesthesia Plan Comments: (Allergies to zofran and toradol- hives Epidural still in from recent labor)        Anesthesia Quick Evaluation

## 2022-03-05 NOTE — Progress Notes (Signed)
Labor Progress Note Stephanie Frazier is a 21 y.o. G2P1001 at 57w1dpresented for IOL due to gHTN S: Doing well. Comfortable with epidural  O:  BP 134/79   Pulse 86   Temp 99 F (37.2 C) (Axillary)   Resp 18   Ht '5\' 7"'$  (1.702 m)   Wt 104.9 kg   LMP 06/18/2021   SpO2 98%   BMI 36.23 kg/m  EFM: 130bpm/Moderate variability/ 15x15 accels/ None decels   CVE: Dilation: 5 Effacement (%): 60 Station: -2 Presentation: Vertex Exam by:: Dr. JJenny Reichmann  A&P: 21y.o. G2P1001 339w1dOL for gHTN. #Labor: Progressing well. Foley out at 90Beazer HomesEpidural placed. AROM of blood tinged fluid at 10:15 #Pain: Epidural #FWB: Cat 1 #GBS negative #gHTN: Asx. Plt normal. Pending CMP and UPC.  LuAlain MarionMD 10:24 AM

## 2022-03-05 NOTE — Anesthesia Preprocedure Evaluation (Signed)
Anesthesia Evaluation  Patient identified by MRN, date of birth, ID band Patient awake    Reviewed: Allergy & Precautions, Patient's Chart, lab work & pertinent test results  Airway Mallampati: II  TM Distance: >3 FB Neck ROM: Full    Dental no notable dental hx.    Pulmonary neg pulmonary ROS,    Pulmonary exam normal breath sounds clear to auscultation       Cardiovascular hypertension (gest HTN no home meds), Normal cardiovascular exam Rhythm:Regular Rate:Normal     Neuro/Psych  Headaches, Seizures - (no meds), Well Controlled,  PSYCHIATRIC DISORDERS Anxiety Depression    GI/Hepatic negative GI ROS, Neg liver ROS,   Endo/Other  BMI 36  Renal/GU negative Renal ROS  negative genitourinary   Musculoskeletal negative musculoskeletal ROS (+)   Abdominal (+) + obese,   Peds negative pediatric ROS (+)  Hematology  (+) Blood dyscrasia, anemia , Hb 11.2, plt 171   Anesthesia Other Findings   Reproductive/Obstetrics (+) Pregnancy                             Anesthesia Physical Anesthesia Plan  ASA: 3  Anesthesia Plan: Epidural   Post-op Pain Management:    Induction:   PONV Risk Score and Plan: 2  Airway Management Planned: Natural Airway  Additional Equipment: None  Intra-op Plan:   Post-operative Plan:   Informed Consent: I have reviewed the patients History and Physical, chart, labs and discussed the procedure including the risks, benefits and alternatives for the proposed anesthesia with the patient or authorized representative who has indicated his/her understanding and acceptance.       Plan Discussed with:   Anesthesia Plan Comments: (Allergies to toradol and zofran- hives )        Anesthesia Quick Evaluation

## 2022-03-05 NOTE — Anesthesia Postprocedure Evaluation (Signed)
Anesthesia Post Note  Patient: Stephanie Frazier  Procedure(s) Performed: POST PARTUM TUBAL LIGATION     Patient location during evaluation: PACU Anesthesia Type: General Level of consciousness: awake and alert, oriented and patient cooperative Pain management: pain level controlled Vital Signs Assessment: post-procedure vital signs reviewed and stable Respiratory status: spontaneous breathing, nonlabored ventilation and respiratory function stable Cardiovascular status: blood pressure returned to baseline and stable Postop Assessment: no apparent nausea or vomiting Anesthetic complications: no Comments: Attempted to use epidural, but having umbilical pain with test- converted to GA/ETT   No notable events documented.  Last Vitals:  Vitals:   03/05/22 1730 03/05/22 1755  BP: (!) 135/94 (!) 137/91  Pulse: 72 80  Resp: (!) 21   Temp:  36.7 C  SpO2: 97% 99%    Last Pain:  Vitals:   03/05/22 1755  TempSrc: Oral  PainSc:    Pain Goal:    LLE Motor Response: Non-purposeful movement (03/05/22 1745) LLE Sensation: Decreased (03/05/22 1745) RLE Motor Response: Purposeful movement (03/05/22 1745) RLE Sensation: Decreased (03/05/22 1745) L Sensory Level: L5-Outer lower leg, top of foot, great toe (03/05/22 1745) R Sensory Level: L5-Outer lower leg, top of foot, great toe (03/05/22 1745) Epidural/Spinal Function Cutaneous sensation: Able to Wiggle Toes (03/05/22 1745), Patient able to flex knees: No (03/05/22 1745), Patient able to lift hips off bed: No (03/05/22 1745), Back pain beyond tenderness at insertion site: No (03/05/22 1745), Progressively worsening motor and/or sensory loss: No (03/05/22 1745), Bowel and/or bladder incontinence post epidural: No (03/05/22 1745)  Pervis Hocking

## 2022-03-05 NOTE — Transfer of Care (Signed)
Immediate Anesthesia Transfer of Care Note  Patient: Stephanie Frazier  Procedure(s) Performed: POST PARTUM TUBAL LIGATION  Patient Location: PACU  Anesthesia Type:General and Epidural  Level of Consciousness: drowsy  Airway & Oxygen Therapy: Patient Spontanous Breathing  Post-op Assessment: Report given to RN and Post -op Vital signs reviewed and stable  Post vital signs: Reviewed and stable  Last Vitals:  Vitals Value Taken Time  BP 132/79 03/05/22 1641  Temp    Pulse 96 03/05/22 1644  Resp 13 03/05/22 1644  SpO2 96 % 03/05/22 1644  Vitals shown include unvalidated device data.  Last Pain:  Vitals:   03/05/22 1431  TempSrc:   PainSc: 0-No pain         Complications: No notable events documented.

## 2022-03-05 NOTE — Lactation Note (Signed)
This note was copied from a baby's chart. Lactation Consultation Note  Patient Name: Stephanie Frazier [id number]'[id number] Date: 03/05/2022 Reason for consult: L&D Initial assessment;Early term 37-38.6wks Age:22 hours  P2, Baby cueing.  Placed baby in football hold and baby latched.  Lactation to follow up on MBU.   Maternal Data Has patient been taught Hand Expression?: Yes Does the patient have breastfeeding experience prior to this delivery?: Yes  Feeding Mother's Current Feeding Choice: Breast Milk  LATCH Score Latch: Repeated attempts needed to sustain latch, nipple held in mouth throughout feeding, stimulation needed to elicit sucking reflex.  Audible Swallowing: A few with stimulation  Type of Nipple: Everted at rest and after stimulation  Comfort (Breast/Nipple): Soft / non-tender  Hold (Positioning): Assistance needed to correctly position infant at breast and maintain latch.  LATCH Score: 7   Interventions Interventions: Assisted with latch;Skin to skin;Education  Consult Status Consult Status: Follow-up from L&D    Vivianne Master [facility] 03/05/2022, 3:07 PM

## 2022-03-05 NOTE — Anesthesia Procedure Notes (Signed)
Epidural Patient location during procedure: OB Start time: 03/05/2022 9:17 AM End time: 03/05/2022 9:25 AM  Staffing Anesthesiologist: Pervis Hocking, DO Performed: anesthesiologist   Preanesthetic Checklist Completed: patient identified, IV checked, risks and benefits discussed, monitors and equipment checked, pre-op evaluation and timeout performed  Epidural Patient position: sitting Prep: DuraPrep and site prepped and draped Patient monitoring: continuous pulse ox, blood pressure, heart rate and cardiac monitor Approach: midline Location: L3-L4 Injection technique: LOR air  Needle:  Needle type: Tuohy  Needle gauge: 17 G Needle length: 9 cm Needle insertion depth: 5.5 cm Catheter type: closed end flexible Catheter size: 19 Gauge Catheter at skin depth: 11 cm Test dose: negative  Assessment Sensory level: T8 Events: blood not aspirated, injection not painful, no injection resistance, no paresthesia and negative IV test  Additional Notes Patient identified. Risks/Benefits/Options discussed with patient including but not limited to bleeding, infection, nerve damage, paralysis, failed block, incomplete pain control, headache, blood pressure changes, nausea, vomiting, reactions to medication both or allergic, itching and postpartum back pain. Confirmed with bedside nurse the patient's most recent platelet count. Confirmed with patient that they are not currently taking any anticoagulation, have any bleeding history or any family history of bleeding disorders. Patient expressed understanding and wished to proceed. All questions were answered. Sterile technique was used throughout the entire procedure. Please see nursing notes for vital signs. Test dose was given through epidural catheter and negative prior to continuing to dose epidural or start infusion. Warning signs of high block given to the patient including shortness of breath, tingling/numbness in hands, complete motor  block, or any concerning symptoms with instructions to call for help. Patient was given instructions on fall risk and not to get out of bed. All questions and concerns addressed with instructions to call with any issues or inadequate analgesia.  Reason for block:procedure for pain

## 2022-03-05 NOTE — Anesthesia Procedure Notes (Signed)
Procedure Name: Intubation Date/Time: 03/05/2022 3:44 PM  Performed by: Ignacia Bayley, CRNAPre-anesthesia Checklist: Patient identified, Patient being monitored, Timeout performed, Emergency Drugs available and Suction available Patient Re-evaluated:Patient Re-evaluated prior to induction Oxygen Delivery Method: Circle System Utilized Preoxygenation: Pre-oxygenation with 100% oxygen Induction Type: IV induction Laryngoscope Size: Miller and 2 Grade View: Grade I Tube type: Oral Tube size: 7.0 mm Number of attempts: 1 Airway Equipment and Method: stylet and Stylet Placement Confirmation: ETT inserted through vocal cords under direct vision, positive ETCO2 and breath sounds checked- equal and bilateral Secured at: 21 cm Tube secured with: Tape Dental Injury: Teeth and Oropharynx as per pre-operative assessment

## 2022-03-05 NOTE — Op Note (Signed)
Stephanie Frazier 03/05/2022  PREOPERATIVE DIAGNOSIS:  Undesired fertility  POSTOPERATIVE DIAGNOSIS:  Undesired fertility  PROCEDURE:  Postpartum Bilateral Tubal Sterilization using Ligasure   SURGEON:  Dr Loma Boston  ANESTHESIA:  Epidural  COMPLICATIONS:  None immediate.  ESTIMATED BLOOD LOSS:  Less than 20cc.  FLUIDS: 500 mL LR.  URINE OUTPUT:  200 mL of clear urine.  INDICATIONS: 21 y.o. yo G2P2002  with undesired fertility,status post vaginal delivery, desires permanent sterilization. Risks and benefits of procedure discussed with patient including permanence of method, bleeding, infection, injury to surrounding organs and need for additional procedures. Risk failure of 0.5-1% with increased risk of ectopic gestation if pregnancy occurs was also discussed with patient.   FINDINGS:  Normal uterus, tubes, and ovaries.  TECHNIQUE:  The patient was taken to the operating room where her epidural anesthesia was dosed up to surgical level and found to be adequate.  She was then placed in the dorsal supine position and prepped and draped in sterile fashion.  After an adequate timeout was performed, attention was turned to the patient's abdomen where a small transverse skin incision was made under the umbilical fold. The incision was taken down to the layer of fascia using the scalpel, and fascia was incised, and extended bilaterally using Mayo scissors. The peritoneum was entered in a sharp fashion.   Attention was then turned to the patient's uterus, and left fallopian tube was identified and followed out to the fimbriated end.  Ligasure device was used to cauterize and cut the mesosalpinx to proximal end of the fallopian tube, removing 6cm of tube. A similar process was carried out on the right side allowing for bilateral tubal sterilization.    Good hemostasis was noted overall.  Local analgesia was drizzled on both operative sites.The instruments were then removed from the patient's  abdomen and the fascial incision was repaired with 0 Vicryl, and the skin was closed with a 3-0 Monocryl subcuticular stitch. The patient tolerated the procedure well.  Sponge, lap, and needle counts were correct times two.  The patient was then taken to the recovery room awake, extubated and in stable condition.   Truett Mainland, DO 03/05/2022 4:44 PM

## 2022-03-05 NOTE — Progress Notes (Addendum)
Labor Progress Note Stephanie Frazier is a 21 y.o. G2P1001 at 44w1dpresented for IOL due to gHTN S: Sleeping comfortably  O:  BP (!) 149/81   Pulse 82   Temp 98.7 F (37.1 C) (Axillary)   Resp 18   Ht '5\' 7"'$  (1.702 m)   Wt 104.9 kg   LMP 06/18/2021   SpO2 98%   BMI 36.23 kg/m  EFM: 140bpm/Moderate variability/ 15x15 accels/ Variable and early decelerations   CVE: Dilation: 7 Effacement (%): 80 Station: -1 Presentation: Vertex Exam by:: Dr. JMelanee Leftvia bedside [place] A&P: 21y.o. G2P1001 365w1dOL for gHTN. #Labor: Progressing well. Foley out at 90Beazer HomesEpidural placed. AROM of blood tinged fluid at 10:15. Repeat check as above #Pain: Epidural #FWB: Cat 2, reassuring #GBS negative #gHTN: Asx. Labs WNL.  LuAlain MarionMD 12:53 PM

## 2022-03-06 ENCOUNTER — Ambulatory Visit: Payer: Medicaid Other

## 2022-03-06 ENCOUNTER — Encounter (HOSPITAL_COMMUNITY): Payer: Self-pay | Admitting: Family Medicine

## 2022-03-06 LAB — CBC
HCT: 31.1 % — ABNORMAL LOW (ref 36.0–46.0)
Hemoglobin: 10.6 g/dL — ABNORMAL LOW (ref 12.0–15.0)
MCH: 30.8 pg (ref 26.0–34.0)
MCHC: 34.1 g/dL (ref 30.0–36.0)
MCV: 90.4 fL (ref 80.0–100.0)
Platelets: 168 10*3/uL (ref 150–400)
RBC: 3.44 MIL/uL — ABNORMAL LOW (ref 3.87–5.11)
RDW: 13.5 % (ref 11.5–15.5)
WBC: 12.8 10*3/uL — ABNORMAL HIGH (ref 4.0–10.5)
nRBC: 0 % (ref 0.0–0.2)

## 2022-03-06 NOTE — Lactation Note (Signed)
This note was copied from a baby's chart. Lactation Consultation Note  Patient Name: Stephanie Frazier XMIWO'E Date: 03/06/2022 Reason for consult: Initial assessment;Early term 37-38.6wks Age:22 hours   P2: Early term infant at 37+1 weeks Feeding preference: Breast  Arrived to find birth parent preparing to breast feed "Stephanie Frazier."  She reported that she has been trying, however, "Stephanie Frazier" remains sleepy.  Discussed characteristics of early term infants and offered to assist with waking.  Birth parent receptive.  Discussed feeding STS always and to continue practicing breast massage and hand expression.  Asked birth parent to demonstrate and finger fed colostrum to "Stephanie Frazier."  Assisted to latch in the cross cradle hold easily and, with gentle stimulation, "Stephanie Frazier" began actively sucking.  Observed her for 14 minutes before she became sleepy.  Suggested birth parent begin pumping after feedings to help ensure a good milk supply and to provide supplementation if colostrum is obtained.  Birth parent willing, however, family arrived as I was preparing the pump.  Birth parent prefers to visit with family now (84 year old daughter present).  Asked her to call for pump set up when it is convenient.  Birth parent verbalized understanding.  RN updated.  Grandmother present and a good support for birth parent.   Maternal Data Has patient been taught Hand Expression?: Yes Does the patient have breastfeeding experience prior to this delivery?: Yes How long did the patient breastfeed?: 4 months  Feeding Mother's Current Feeding Choice: Breast Milk  LATCH Score Latch: Grasps breast easily, tongue down, lips flanged, rhythmical sucking.  Audible Swallowing: A few with stimulation  Type of Nipple: Everted at rest and after stimulation  Comfort (Breast/Nipple): Soft / non-tender  Hold (Positioning): Assistance needed to correctly position infant at breast and maintain latch.  LATCH Score: 8   Lactation  Tools Discussed/Used Breast pump type: Manual  Interventions Interventions: Breast feeding basics reviewed;Assisted with latch;Skin to skin;Breast massage;Hand express;Breast compression;Hand pump;Expressed milk;Position options;Support pillows;Adjust position;Education  Discharge Pump: Personal  Consult Status Consult Status: Follow-up Date: 03/07/22 Follow-up type: In-patient    Kili Gracy R Akhil Piscopo 03/06/2022, 10:45 AM

## 2022-03-06 NOTE — Anesthesia Postprocedure Evaluation (Signed)
Anesthesia Post Note  Patient: Stephanie Frazier  Procedure(s) Performed: AN AD HOC LABOR EPIDURAL     Patient location during evaluation: Mother Baby Anesthesia Type: Epidural Level of consciousness: awake, awake and alert and oriented Pain management: pain level controlled Vital Signs Assessment: post-procedure vital signs reviewed and stable Respiratory status: spontaneous breathing, nonlabored ventilation and respiratory function stable Cardiovascular status: stable Postop Assessment: no headache, patient able to bend at knees, no apparent nausea or vomiting, adequate PO intake, able to ambulate and no backache Anesthetic complications: no   No notable events documented.  Last Vitals:  Vitals:   03/06/22 0205 03/06/22 0524  BP: 128/82 128/70  Pulse: 84 70  Resp: 18 18  Temp: 36.6 C 36.7 C  SpO2: 95% 95%    Last Pain:  Vitals:   03/06/22 1115  TempSrc:   PainSc: 5    Pain Goal:                   Stephanie Frazier

## 2022-03-06 NOTE — Lactation Note (Signed)
This note was copied from a baby's chart. Lactation Consultation Note  Patient Name: Stephanie Frazier MOLMB'E Date: 03/06/2022 Reason for consult: Early term 37-38.6wks;Follow-up assessment Age:22 hours   P2: Early term infant at 43 hours old Feeding preference: Breast  Birth parent had not yet called me back to initiate pumping after her family visit this morning.  Arrived at feeding time and offered to assist with latching; birth parent receptive.  "[name]" latched easily and breast fed for 16 minutes; few swallows noted.  Initiated the pump after feeding.  Reviewed pump, pump parts and cleaning.  #24 flange size is appropriate.  No colostrum drops obtained.  Reassurance provided and encouraged continued pumping after every feeding.  Due to gestational age, birth parent will feed at least every three hours or sooner if baby shows cues.  She will call for assistance as needed.  Maternal grandmother arrived at the end of my visit.   Maternal Data Has patient been taught Hand Expression?: Yes Does the patient have breastfeeding experience prior to this delivery?: Yes How long did the patient breastfeed?: 4 months  Feeding Mother's Current Feeding Choice: Breast Milk  LATCH Score Latch: Grasps breast easily, tongue down, lips flanged, rhythmical sucking.  Audible Swallowing: A few with stimulation  Type of Nipple: Everted at rest and after stimulation  Comfort (Breast/Nipple): Soft / non-tender  Hold (Positioning): Assistance needed to correctly position infant at breast and maintain latch.  LATCH Score: 8   Lactation Tools Discussed/Used Tools: Pump;Flanges Flange Size: 24 Breast pump type: Double-Electric Breast Pump;Manual Pump Education: Setup, frequency, and cleaning;Milk Storage Reason for Pumping: Breast stimulation for supplementation Pumping frequency: Every three hours  Interventions Interventions: Breast feeding basics reviewed;Assisted with latch;Skin to  skin;Breast massage;Hand express;Breast compression;Hand pump;Expressed milk;Position options;Support pillows;Adjust position;DEBP;Education  Discharge Pump: Personal  Consult Status Consult Status: Follow-up Date: 03/07/22 Follow-up type: In-patient    [name] [name] 03/06/2022, 1:47 PM

## 2022-03-06 NOTE — Progress Notes (Signed)
POSTPARTUM PROGRESS NOTE  Post Partum Day 1  Subjective:  Stephanie Frazier is a 21 y.o. [id number] s/p SVD at [redacted]w[redacted]d  No acute events overnight.  Pt denies problems with ambulating, voiding or po intake.  She denies nausea or vomiting.  Pain is moderately controlled.  She has had flatus. She has had bowel movement.  Lochia Small.   Objective: Blood pressure 128/70, pulse 70, temperature 98 F (36.7 C), temperature source Oral, resp. rate 18, height '5\' 7"'$  (1.702 m), weight 104.9 kg, last menstrual period 06/18/2021, SpO2 95 %, unknown if currently breastfeeding.  Physical Exam:  General: alert, cooperative and no distress Chest: no respiratory distress Heart:regular rate, distal pulses intact Abdomen: soft, nontender,  Uterine Fundus: firm, appropriately tender Skin: warm, dry; incision clean/dry/intact with dressing over top  Recent Labs    03/05/22 0756 03/06/22 0417  HGB 11.2* 10.6*  HCT 32.9* 31.1*    Assessment/Plan: Stephanie CRISTis a 89y.o. [id number]/p SVD at 367w1d PPD#1/POD1 - Doing well. Moderate pain from surgery. Recommended PRN oxy as needed Contraception: BTL Feeding: Breast Dispo: Plan for discharge tomorrow gHTN: on Lasix for 5 days. Procardia 30 mg and can increase if pressures >140/90.   LOS: 1 day   JoAlain MarionMD, 03/06/2022, 7:10 AM

## 2022-03-07 ENCOUNTER — Encounter: Payer: Medicaid Other | Admitting: Family Medicine

## 2022-03-07 ENCOUNTER — Other Ambulatory Visit (HOSPITAL_COMMUNITY): Payer: Self-pay

## 2022-03-07 ENCOUNTER — Encounter: Payer: Medicaid Other | Admitting: Obstetrics and Gynecology

## 2022-03-07 LAB — SURGICAL PATHOLOGY

## 2022-03-07 MED ORDER — FUROSEMIDE 20 MG PO TABS
20.0000 mg | ORAL_TABLET | Freq: Every day | ORAL | 0 refills | Status: DC
  Filled 2022-03-07: qty 2, 2d supply, fill #0

## 2022-03-07 MED ORDER — IBUPROFEN 600 MG PO TABS
600.0000 mg | ORAL_TABLET | Freq: Four times a day (QID) | ORAL | 0 refills | Status: DC | PRN
  Filled 2022-03-07: qty 30, 8d supply, fill #0

## 2022-03-07 MED ORDER — NIFEDIPINE ER 30 MG PO TB24
30.0000 mg | ORAL_TABLET | Freq: Every day | ORAL | 2 refills | Status: DC
  Filled 2022-03-07 – 2022-04-01 (×2): qty 30, 30d supply, fill #0

## 2022-03-07 MED ORDER — OXYCODONE-ACETAMINOPHEN 5-325 MG PO TABS
1.0000 | ORAL_TABLET | ORAL | 0 refills | Status: DC | PRN
  Filled 2022-03-07: qty 10, 2d supply, fill #0

## 2022-03-07 NOTE — Progress Notes (Signed)
POSTPARTUM PROGRESS NOTE  Post Partum Day 2  Subjective:  Stephanie Frazier is a 21 y.o. [id number] s/p SVD at 109w1d  Having irritation in her throat. Endorses shoulder pain.  Pt denies problems with ambulating, voiding or po intake.  She denies nausea or vomiting.  Pain is moderately controlled.  She has had flatus. She has had bowel movement.  Lochia Small.   Objective: Blood pressure 126/78, pulse 72, temperature 97.6 F (36.4 C), temperature source Oral, resp. rate 18, height '5\' 7"'$  (1.702 m), weight 104.9 kg, last menstrual period 06/18/2021, SpO2 99 %, unknown if currently breastfeeding.  Physical Exam:  General: alert, cooperative and no distress HEENT: oropharynx without erythema or edema Neck: supple without LAD Chest: no respiratory distress Heart:regular rate, distal pulses intact Abdomen: soft, nontender,  Uterine Fundus: firm, appropriately tender Skin: warm, dry; incision clean/dry/intact with dressing over top  Recent Labs    03/05/22 0756 03/06/22 0417  HGB 11.2* 10.6*  HCT 32.9* 31.1*     Assessment/Plan: Stephanie LEAMANis a 89y.o. [id number]/p SVD at 362w1d PPD#2/POD2 - Doing well. Moderate pain from surgery. Recommended PRN oxy as needed Contraception: BTL Feeding: Breast Dispo: Plan for discharge tomorrow gHTN: on Lasix for 5 days. Procardia 30 mg and can increase if pressures >140/90. Iron deficiency anemia: DC on oral iron   LOS: 2 days   JoAlain MarionMD, 03/07/2022, 7:00 AM

## 2022-03-07 NOTE — Lactation Note (Signed)
This note was copied from a baby's chart. Lactation Consultation Note  Patient Name: Stephanie Frazier NTIRW'E Date: 03/07/2022 Reason for consult: Follow-up assessment;Early term 37-38.6wks;Infant weight loss;Difficult latch;Nipple pain/trauma Age:22 hours  LC met with P2 birth parent of ET infant on day of discharge.  Infant is at a 7% weight loss.  Nipple pain and difficult latch resulted in supplementation by bottle and double pumping.  No pump at home.  [facility] referral sent, hand pump provided.  Encouraged birth parent to call [facility] office.  Encouraged STS with baby as much as possible.   Encouraged double pumping and hand expression every time baby is supplemented and not latching on the breast.  Engorgement prevention and treatment reviewed. OP lactation support reviewed.  Lactation Tools Discussed/Used Tools: Pump;Flanges;Bottle Breast pump type: Double-Electric Breast Pump;Manual Pump Education: Setup, frequency, and cleaning;Milk Storage Reason for Pumping: Support milk supply/DL/nipple pain Pumping frequency: Encouraged to pump both breasts for 15-20 mins  Interventions Interventions: Breast feeding basics reviewed;Skin to skin;Breast massage;Hand express;DEBP;Hand pump;Education;Pace feeding  Discharge Discharge Education: Engorgement and breast care;Warning signs for feeding baby;Outpatient recommendation (Dyad will see IBCLC at [facility] office) Pump: Manual WIC Program: Yes  Consult Status Consult Status: Complete Date: 03/07/22 Follow-up type: Call as needed    Broadus John 03/07/2022, 12:42 PM

## 2022-03-12 ENCOUNTER — Ambulatory Visit: Payer: Medicaid Other

## 2022-03-13 ENCOUNTER — Other Ambulatory Visit: Payer: Self-pay

## 2022-03-13 ENCOUNTER — Ambulatory Visit (INDEPENDENT_AMBULATORY_CARE_PROVIDER_SITE_OTHER): Payer: Medicaid Other | Admitting: *Deleted

## 2022-03-13 VITALS — BP 137/77 | HR 100 | Ht 67.0 in | Wt 212.9 lb

## 2022-03-13 DIAGNOSIS — Z0130 Encounter for examination of blood pressure without abnormal findings: Secondary | ICD-10-CM

## 2022-03-13 NOTE — Progress Notes (Signed)
Here for nurse visit for bp check . Was induced for GHTN at 75w1dand delivered 03/05/22. Was discharged on 03/07/22 on procardia 30 mg /24 hr daily. Denies headaches or edema. BP today 137/79 , and repeat 137/77. Advised to continue taking Procardia as ordered and keep postpartum appointment 04/12/22. Also reviewed Pre-eclampsia precautions and to call [place] needed or go to hospital if severe symptoms. She voices understanding. LStaci Acosta

## 2022-03-14 ENCOUNTER — Encounter: Payer: Medicaid Other | Admitting: Family Medicine

## 2022-03-14 NOTE — Progress Notes (Signed)
Patient was assessed and managed by nursing staff during this encounter. I have reviewed the chart and agree with the documentation and plan. I have also made any necessary editorial changes.  Aletha Halim, MD 03/14/2022 10:53 AM

## 2022-03-17 ENCOUNTER — Encounter: Payer: Self-pay | Admitting: Family Medicine

## 2022-03-17 ENCOUNTER — Emergency Department (HOSPITAL_COMMUNITY)
Admission: EM | Admit: 2022-03-17 | Discharge: 2022-03-17 | Discharge status: Against Medical Advice | Payer: Medicaid Other | Attending: Emergency Medicine | Admitting: Emergency Medicine

## 2022-03-17 DIAGNOSIS — Z5321 Procedure and treatment not carried out due to patient leaving prior to being seen by health care provider: Secondary | ICD-10-CM | POA: Diagnosis present

## 2022-03-17 NOTE — ED Notes (Signed)
Pt stated that she is leaving and is going to call her OB

## 2022-03-18 ENCOUNTER — Telehealth: Payer: Self-pay | Admitting: *Deleted

## 2022-03-18 NOTE — Telephone Encounter (Signed)
Called pt to discuss her concerns as stated in a Mychart message.  She reports that she had some sort of allergic reaction on 8/26 wherein she had a rash, became dizzy, her throat swelled and it was hard to swallow. Her mother had an epipen so she used that and the symptoms went away over about an hour. She had similar symptoms yesterday of feeling dizzy, throat swelling and looked like she had a sunburn. She took diphenhydramine and the symptoms went away slowly. When this occurred, her BP was 117/70. Pt denies eating any unusual or new foods. She also denies changing any soaps, detergents, body wash or body lotion. She states that nothing is different. I advised that if the symptoms occur again, she should take the diphenhydramine, check her BP and then go to [facility] for evaluation. She voiced understanding.

## 2022-03-25 ENCOUNTER — Inpatient Hospital Stay (HOSPITAL_COMMUNITY): Admit: 2022-03-25 | Payer: Self-pay

## 2022-03-27 NOTE — BH Specialist Note (Deleted)
Integrated Behavioral Health via Telemedicine Visit  03/27/2022 CLEVA CAMERO [id number]  Number of Integrated Behavioral Health Clinician visits: No data recorded Session Start time: No data recorded  Session End time: No data recorded Total time in minutes: No data recorded  Referring Provider: *** Patient/Family location: *** [facility] Provider location: *** All persons participating in visit: *** Types of Service: {CHL AMB TYPE OF [phone]}  I connected with Otila Kluver Dible and/or Vyla C Delisa's {family members:20773} via  Telephone or Video Enabled Telemedicine Application  (Video is Caregility application) and verified that I am speaking with the correct person using two identifiers. Discussed confidentiality: {YES/NO:21197}  I discussed the limitations of telemedicine and the availability of in person appointments.  Discussed there is a possibility of technology failure and discussed alternative modes of communication if that failure occurs.  I discussed that engaging in this telemedicine visit, they consent to the provision of behavioral healthcare and the services will be billed under their insurance.  Patient and/or legal guardian expressed understanding and consented to Telemedicine visit: {YES/NO:21197}  Presenting Concerns: Patient and/or family reports the following symptoms/concerns: *** Duration of problem: ***; Severity of problem: {Mild/Moderate/Severe:20260}  Patient and/or Family's Strengths/Protective Factors: {CHL AMB BH PROTECTIVE FACTORS:[id number]}  Goals Addressed: Patient will:  Reduce symptoms of: {IBH Symptoms:21014056}   Increase knowledge and/or ability of: {IBH Patient Tools:21014057}   Demonstrate ability to: {IBH Goals:21014053}  Progress towards Goals: {CHL AMB BH PROGRESS TOWARDS GOALS:3322569081}  Interventions: Interventions utilized:  {IBH Interventions:21014054} Standardized Assessments completed: {IBH Screening  Tools:21014051}  Patient and/or Family Response: ***  Assessment: Patient currently experiencing ***.   Patient may benefit from ***.  Plan: Follow up with behavioral health clinician on : *** Behavioral recommendations: *** Referral(s): {IBH Referrals:21014055}  I discussed the assessment and treatment plan with the patient and/or parent/guardian. They were provided an opportunity to ask questions and all were answered. They agreed with the plan and demonstrated an understanding of the instructions.   They were advised to call back or seek an in-person evaluation if the symptoms worsen or if the condition fails to improve as anticipated.  Caroleen Hamman Brendaliz Kuk, LCSW

## 2022-04-02 NOTE — BH Specialist Note (Unsigned)
Integrated Behavioral Health via Telemedicine Visit  04/09/2022 Stephanie Frazier [id number]  Number of [place] Clinician visits: 1- Initial Visit  Session Start time: 1324   Session End time: 4010  Total time in minutes: 21   Referring Provider: Darron Doom, MD Patient/Family location: [facility] Provider location: [facility] [place] [facility]  All persons participating in visit: [name] and [place]   Types of Service: Individual psychotherapy and Telephone visit  I connected with Stephanie Frazier and/or Stephanie Frazier's mother via  Telephone or Geologist, engineering  (Video is Caregility application) and verified that I am speaking with the correct person using two identifiers. Discussed confidentiality: Yes   I discussed the limitations of telemedicine and the availability of in person appointments.  Discussed there is a possibility of technology failure and discussed alternative modes of communication if that failure occurs.  I discussed that engaging in this telemedicine visit, they consent to the provision of behavioral healthcare and the services will be billed under their insurance.  Patient and/or legal guardian expressed understanding and consented to Telemedicine visit: Yes   Presenting Concerns: Patient and/or family reports the following symptoms/concerns: Increased depression and anxiety symptoms, most recent increase attributed to CPS involvement after baby scratched herself and caused bruising; pt is coping with good support from her parents and self-coping strategies (music, drawing, etc.). Duration of problem: Increase postpartum; Severity of problem:  moderately severe  Patient and/or Family's Strengths/Protective Factors: Social connections and Sense of purpose  Goals Addressed: Patient will:  Reduce symptoms of: anxiety, depression, and stress    Increase knowledge and/or ability of: stress reduction   Demonstrate ability to: Increase healthy adjustment to current life circumstances and Increase adequate support systems for patient/family  Progress towards Goals: Ongoing  Interventions: Interventions utilized:  Functional Assessment of ADLs, Psychoeducation and/or Health Education, Link to [employer], and Supportive Reflection Standardized Assessments completed: GAD-7 and PHQ 9  Patient and/or Family Response: Patient agrees with treatment plan.   Assessment: Patient currently experiencing Major depressive disorder, recurrent, with unspecified severity   Patient may benefit from psychoeducation and brief therapeutic interventions regarding coping with symptoms of depression, anxiety, life stress .  Plan: Follow up with behavioral health clinician on : One week; Call Kyannah Climer at [phone], as needed. Behavioral recommendations:  -Continue prioritizing healthy self-care (regular meals, adequate rest; allowing practical help from supportive friends and family) until at least postpartum medical appointment -Consider new mom support group as needed at either [email or web address] or [email or web address]   Referral(s): [facility] (In Clinic) and [employer]:  new mom support  I discussed the assessment and treatment plan with the patient and/or parent/guardian. They were provided an opportunity to ask questions and all were answered. They agreed with the plan and demonstrated an understanding of the instructions.   They were advised to call back or seek an in-person evaluation if the symptoms worsen or if the condition fails to improve as anticipated.  [place], LCSW     04/09/2022    2:30 PM 02/28/2022    3:25 PM 02/14/2022    3:23 PM 01/31/2022    4:11 PM 12/27/2021    8:30 AM  Depression screen PHQ 2/9  Decreased Interest 1 0 '1 1 1  '$ Down, Depressed, Hopeless 3 0 0 1 1  PHQ  - 2 Score 4 0 '1 2 2  '$ Altered sleeping '1 1 1 1 '$ 1  Tired, decreased energy '2 1 1 1 2  '$ Change in appetite 2 0 0 0 0  Feeling bad or failure about yourself  3 0 0 1 0  Trouble concentrating 1 0 0 0 0  Moving slowly or fidgety/restless 1 0 1 0 0  Suicidal thoughts 1 0 0 0 0  PHQ-9 Score '15 2 4 5 5      '$ 04/09/2022    2:34 PM 02/28/2022    3:25 PM 02/14/2022    3:23 PM 01/31/2022    4:11 PM  GAD 7 : Generalized Anxiety Score  Nervous, Anxious, on Edge '3 1 1 1  '$ Control/stop worrying 3 0 1 1  Worry too much - different things 1 0 0 0  Trouble relaxing '1 1 1 1  '$ Restless '1 1 1 '$ 0  Easily annoyed or irritable '1 1 1 2  '$ Afraid - awful might happen 3 0 0 0  Total GAD 7 Score '13 4 5 '$ 5

## 2022-04-08 ENCOUNTER — Other Ambulatory Visit (HOSPITAL_COMMUNITY): Payer: Self-pay

## 2022-04-09 ENCOUNTER — Other Ambulatory Visit (HOSPITAL_COMMUNITY): Payer: Self-pay

## 2022-04-09 ENCOUNTER — Ambulatory Visit (INDEPENDENT_AMBULATORY_CARE_PROVIDER_SITE_OTHER): Payer: Medicaid Other | Admitting: Clinical

## 2022-04-09 DIAGNOSIS — F339 Major depressive disorder, recurrent, unspecified: Secondary | ICD-10-CM

## 2022-04-09 NOTE — Patient Instructions (Signed)
[facility] [address] [place], [place] [address] [phone] (main office) [phone] (Cassie Shedlock's office)  New Parent Support Groups [email or web address]   

## 2022-04-11 ENCOUNTER — Other Ambulatory Visit (HOSPITAL_COMMUNITY): Payer: Self-pay

## 2022-04-12 ENCOUNTER — Ambulatory Visit (INDEPENDENT_AMBULATORY_CARE_PROVIDER_SITE_OTHER): Payer: Medicaid Other | Admitting: Obstetrics and Gynecology

## 2022-04-12 ENCOUNTER — Other Ambulatory Visit (HOSPITAL_COMMUNITY): Payer: Self-pay

## 2022-04-12 ENCOUNTER — Other Ambulatory Visit: Payer: Self-pay
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
## 2022-04-15 NOTE — BH Specialist Note (Unsigned)
Integrated Behavioral Health via Telemedicine Visit  04/18/2022 Stephanie Frazier [id number]  Number of [place] Clinician visits: 2- Second Visit  Session Start time: 0623   Session End time: 7628  Total time in minutes: 21   Referring Provider: Darron Doom, MD Patient/Family location: Home [facility] Provider location: [facility] [place] [facility]  All persons participating in visit: [name] and Stephanie Frazier   Types of Service: Individual psychotherapy and Video visit  I connected with Stephanie Frazier and/or Stephanie Frazier's  n/a  via  Telephone or Video Enabled Telemedicine Application  (Video is Caregility application) and verified that I am speaking with the correct person using two identifiers. Discussed confidentiality: Yes   I discussed the limitations of telemedicine and the availability of in person appointments.  Discussed there is a possibility of technology failure and discussed alternative modes of communication if that failure occurs.  I discussed that engaging in this telemedicine visit, they consent to the provision of behavioral healthcare and the services will be billed under their insurance.  Patient and/or legal guardian expressed understanding and consented to Telemedicine visit: Yes   Presenting Concerns: Patient and/or family reports the following symptoms/concerns: Ongoing stress regarding CPS case, along with concern for baby with colic and acid reflux; open to implementing self-coping strategy. Duration of problem: Postpartum; Severity of problem: moderate  Patient and/or Family's Strengths/Protective Factors: Social connections, Concrete supports in place (healthy food, safe environments, etc.), Sense of purpose, and Physical Health (exercise, healthy diet, medication compliance, etc.)  Goals Addressed: Patient will:  Reduce symptoms of: anxiety, depression, and stress    Increase knowledge and/or ability of: self-management skills   Demonstrate ability to: Increase healthy adjustment to current life circumstances  Progress towards Goals: Ongoing  Interventions: Interventions utilized:  Mindfulness or Relaxation Training Standardized Assessments completed: Not Needed  Patient and/or Family Response: Patient agrees with treatment plan.   Assessment: Patient currently experiencing Major depressive disorder, moderate.   Patient may benefit from continued therapeutic interventions.  Plan: Follow up with behavioral health clinician on : Two weeks Behavioral recommendations:  -Continue using self-coping strategies that have helped manage stress and depression in the past -CALM relaxation breathing exercise twice daily (morning; at bedtime); as needed throughout the day.   Referral(s): [place] (In Clinic)  I discussed the assessment and treatment plan with the patient and/or parent/guardian. They were provided an opportunity to ask questions and all were answered. They agreed with the plan and demonstrated an understanding of the instructions.   They were advised to call back or seek an in-person evaluation if the symptoms worsen or if the condition fails to improve as anticipated.  [place], LCSW    04/09/2022    2:30 PM 02/28/2022    3:25 PM 02/14/2022    3:23 PM 01/31/2022    4:11 PM 12/27/2021    8:30 AM  Depression screen PHQ 2/9  Decreased Interest 1 0 '1 1 1  '$ Down, Depressed, Hopeless 3 0 0 1 1  PHQ - 2 Score 4 0 '1 2 2  '$ Altered sleeping '1 1 1 1 1  '$ Tired, decreased energy '2 1 1 1 2  '$ Change in appetite 2 0 0 0 0  Feeling bad or failure about yourself  3 0 0 1 0  Trouble concentrating 1 0 0 0 0  Moving slowly or fidgety/restless 1 0 1 0 0  Suicidal thoughts 1 0  0 0 0  PHQ-9 Score '15 2 4 5 5      '$ 04/09/2022    2:34 PM 02/28/2022    3:25 PM 02/14/2022    3:23 PM 01/31/2022    4:11 PM  GAD 7 : Generalized  Anxiety Score  Nervous, Anxious, on Edge '3 1 1 1  '$ Control/stop worrying 3 0 1 1  Worry too much - different things 1 0 0 0  Trouble relaxing '1 1 1 1  '$ Restless '1 1 1 '$ 0  Easily annoyed or irritable '1 1 1 2  '$ Afraid - awful might happen 3 0 0 0  Total GAD 7 Score '13 4 5 '$ 5

## 2022-04-18 ENCOUNTER — Ambulatory Visit (INDEPENDENT_AMBULATORY_CARE_PROVIDER_SITE_OTHER): Payer: Medicaid Other | Admitting: Clinical

## 2022-04-18 DIAGNOSIS — Z658 Other specified problems related to psychosocial circumstances: Secondary | ICD-10-CM | POA: Diagnosis not present

## 2022-04-18 DIAGNOSIS — F331 Major depressive disorder, recurrent, moderate: Secondary | ICD-10-CM

## 2022-04-18 NOTE — Patient Instructions (Signed)
[facility] at  [facility] [address] [place], [place] [address] [phone] (main office) [phone] (Boyde Grieco's office)   

## 2022-04-23 ENCOUNTER — Encounter: Payer: Self-pay | Admitting: Family Medicine

## 2022-04-26 ENCOUNTER — Other Ambulatory Visit: Payer: Self-pay

## 2022-04-26 ENCOUNTER — Ambulatory Visit (INDEPENDENT_AMBULATORY_CARE_PROVIDER_SITE_OTHER): Payer: Medicaid Other | Admitting: *Deleted

## 2022-04-26 VITALS — BP 123/91 | HR 101 | Ht 67.0 in | Wt 215.7 lb

## 2022-04-26 DIAGNOSIS — Z0130 Encounter for examination of blood pressure without abnormal findings: Secondary | ICD-10-CM

## 2022-04-26 DIAGNOSIS — F53 Postpartum depression: Secondary | ICD-10-CM

## 2022-04-26 DIAGNOSIS — F419 Anxiety disorder, unspecified: Secondary | ICD-10-CM

## 2022-04-26 MED ORDER — SERTRALINE HCL 50 MG PO TABS: 50.0000 mg | ORAL_TABLET | Freq: Every day | ORAL | 1 refills | Status: DC

## 2022-04-26 NOTE — Progress Notes (Signed)
Pt presents for blood pressure check following vaginal delivery on 8/15. Pt had gHTN w/pregnancy and had PP exam on 9/22. She is not taking any antihypertensive medication. BP today = 123/91, P - 101. She denies H/A or visual disturbances. Pt reports recent social situations regarding her family and children which is causing increased stress and anxiety. She also states that she has history of PP depression. Pt has a scheduled appt w/Jamie in our office on 10/16.  I advised that I will send a message to Roselyn Reef and ask that she reach out to pt next week as she is not in the office today. Pt was advised that she may present to [facility] across the street 24/7 if she has urgent need for mental health care. She requests Rx for anxiety/depression medication. Per consult with Dr. Damita Dunnings, Rx for Zoloft was sent to her pharmacy. Pt was also referred to [place] to establish PCP care for ongoing depression and hypertension management. Pt voiced understanding of all information and instruction given.
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
## 2022-05-01 NOTE — Addendum Note (Signed)
Addended by: Radene Gunning A on: 05/01/2022 10:01 AM   Modules accepted: Orders

## 2022-05-06 ENCOUNTER — Ambulatory Visit (INDEPENDENT_AMBULATORY_CARE_PROVIDER_SITE_OTHER): Payer: Medicaid Other | Admitting: Clinical

## 2022-05-06 ENCOUNTER — Encounter: Payer: Self-pay | Admitting: Obstetrics and Gynecology

## 2022-05-06 DIAGNOSIS — F331 Major depressive disorder, recurrent, moderate: Secondary | ICD-10-CM | POA: Diagnosis not present

## 2022-05-06 DIAGNOSIS — Z658 Other specified problems related to psychosocial circumstances: Secondary | ICD-10-CM

## 2022-05-08 ENCOUNTER — Ambulatory Visit: Payer: Medicaid Other | Admitting: Family Medicine
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
## 2022-05-20 ENCOUNTER — Ambulatory Visit (INDEPENDENT_AMBULATORY_CARE_PROVIDER_SITE_OTHER): Payer: Medicaid Other | Admitting: Clinical

## 2022-05-20 DIAGNOSIS — Z658 Other specified problems related to psychosocial circumstances: Secondary | ICD-10-CM

## 2022-05-20 DIAGNOSIS — F331 Major depressive disorder, recurrent, moderate: Secondary | ICD-10-CM

## 2022-05-21 NOTE — BH Specialist Note (Unsigned)
Integrated Behavioral Health via Telemedicine Visit  06/03/2022 Stephanie Frazier [id number]  Number of [place] Clinician visits: 5-Fifth Visit  Session Start time: 2426   Session End time: 8341  Total time in minutes: 29   Referring Provider: Darron Doom, MD Patient/Family location: Home [facility] Provider location: [facility] [place] [facility]  All persons participating in visit: [name] and [facility]   Types of Service: Individual psychotherapy and Video visit  I connected with Stephanie and/or Stephanie Frazier's  n/a  via  Telephone or Video Enabled Telemedicine Application  (Video is Caregility application) and verified that I am speaking with the correct person using two identifiers. Discussed confidentiality: Yes   I discussed the limitations of telemedicine and the availability of in person appointments.  Discussed there is a possibility of technology failure and discussed alternative modes of communication if that failure occurs.  I discussed that engaging in this telemedicine visit, they consent to the provision of behavioral healthcare and the services will be billed under their insurance.  Patient and/or legal guardian expressed understanding and consented to Telemedicine visit: Yes   Presenting Concerns: Patient and/or family reports the following symptoms/concerns: Worry about daughter's mental health, as well as saddened by missing out on son's early milestones, due to this lengthy separation. Pt is processing feelings regarding having to hide her true self from family of origin to fit narrow expectations(clothing choices,etc.); felt relief after blocking immediate family and reconnecting to more supportive extended family; looking forward to children coming home in the next two weeks to stay.  Duration of problem: Postpartum; Severity of problem: moderate  Patient and/or Family's  Strengths/Protective Factors: Social connections, Concrete supports in place (healthy food, safe environments, etc.), Sense of purpose, and Physical Health (exercise, healthy diet, medication compliance, etc.)  Goals Addressed: Patient will:  Reduce symptoms of: anxiety, depression, and stress   Increase knowledge and/or ability of: stress reduction   Demonstrate ability to: Increase healthy adjustment to current life circumstances and Increase adequate support systems for patient/family  Progress towards Goals: Ongoing  Interventions: Interventions utilized:  Supportive Reflection Standardized Assessments completed: Not Needed  Patient and/or Family Response: Patient agrees with treatment plan.   Assessment: Patient currently experiencing Major depressive disorder, recurrent, moderate and Psychosocial stress. Pt would benefit from ADHD assessment via psychiatry..   Patient may benefit from continued therapeutic interventions .  Plan: Follow up with behavioral health clinician on : Three weeks Behavioral recommendations:  -Continue taking BH medication as prescribed -Continue plan to attend initial psychiatry appointment on 06/05/22 -Continue setting healthy boundaries with unsupportive family members; continue spending quality time with partner and supportive friends and family daily -Continue preparing home for children to return -Continue plan to utilize in-home services provided via CPS Referral(s): [facility]ist (In Clinic) and [place] (LME/Outside Clinic)  I discussed the assessment and treatment plan with the patient and/or parent/guardian. They were provided an opportunity to ask questions and all were answered. They agreed with the plan and demonstrated an understanding of the instructions.   They were advised to call back or seek an in-person evaluation if the symptoms worsen or if the condition fails to improve as  anticipated.  [place], LCSW     05/20/2022    3:24 PM 04/09/2022    2:30 PM 02/28/2022    3:25 PM 02/14/2022    3:23 PM 01/31/2022    4:11  PM  Depression screen PHQ 2/9  Decreased Interest 1 1 0 1 1  Down, Depressed, Hopeless 1 3 0 0 1  PHQ - 2 Score 2 4 0 1 2  Altered sleeping '3 1 1 1 1  '$ Tired, decreased energy '1 2 1 1 1  '$ Change in appetite 0 2 0 0 0  Feeling bad or failure about yourself  2 3 0 0 1  Trouble concentrating 1 1 0 0 0  Moving slowly or fidgety/restless 3 1 0 1 0  Suicidal thoughts 0 1 0 0 0  PHQ-9 Score '12 15 2 4 5      '$ 05/20/2022    3:28 PM 04/09/2022    2:34 PM 02/28/2022    3:25 PM 02/14/2022    3:23 PM  GAD 7 : Generalized Anxiety Score  Nervous, Anxious, on Edge '3 3 1 1  '$ Control/stop worrying 3 3 0 1  Worry too much - different things 3 1 0 0  Trouble relaxing '1 1 1 1  '$ Restless '3 1 1 1  '$ Easily annoyed or irritable '1 1 1 1  '$ Afraid - awful might happen 3 3 0 0  Total GAD 7 Score '17 13 4 '$ 5

## 2022-06-03 ENCOUNTER — Ambulatory Visit (INDEPENDENT_AMBULATORY_CARE_PROVIDER_SITE_OTHER): Payer: Medicaid Other | Admitting: Clinical

## 2022-06-03 DIAGNOSIS — F331 Major depressive disorder, recurrent, moderate: Secondary | ICD-10-CM

## 2022-06-03 DIAGNOSIS — Z658 Other specified problems related to psychosocial circumstances: Secondary | ICD-10-CM

## 2022-06-05 ENCOUNTER — Ambulatory Visit (INDEPENDENT_AMBULATORY_CARE_PROVIDER_SITE_OTHER): Payer: Medicaid Other | Admitting: Psychiatry

## 2022-06-05 ENCOUNTER — Encounter (HOSPITAL_COMMUNITY): Payer: Self-pay | Admitting: Psychiatry

## 2022-06-05 DIAGNOSIS — F411 Generalized anxiety disorder: Secondary | ICD-10-CM | POA: Diagnosis not present

## 2022-06-05 DIAGNOSIS — F333 Major depressive disorder, recurrent, severe with psychotic symptoms: Secondary | ICD-10-CM

## 2022-06-05 DIAGNOSIS — F4310 Post-traumatic stress disorder, unspecified: Secondary | ICD-10-CM | POA: Diagnosis not present

## 2022-06-05 DIAGNOSIS — F410 Panic disorder [episodic paroxysmal anxiety] without agoraphobia: Secondary | ICD-10-CM

## 2022-06-05 DIAGNOSIS — F5089 Other specified eating disorder: Secondary | ICD-10-CM

## 2022-06-05 DIAGNOSIS — F53 Postpartum depression: Secondary | ICD-10-CM

## 2022-06-05 DIAGNOSIS — F5104 Psychophysiologic insomnia: Secondary | ICD-10-CM | POA: Diagnosis not present

## 2022-06-05 HISTORY — DX: Other specified eating disorder: F50.89

## 2022-06-05 MED ORDER — SERTRALINE HCL 100 MG PO TABS: 100.0000 mg | ORAL_TABLET | Freq: Every day | ORAL | 0 refills | Status: DC
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
## 2022-06-05 NOTE — Patient Instructions (Signed)
We increased your zoloft to '100mg'$  daily today. Please coordinate with PCP or your cardiologist to obtain orthostatic vital signs, institute blind weights, ecg, vitamin d, vitamin b12, phosphorus, magnesium, CMP, CBC, iron panel, TSH, lipid panel, lipase.

## 2022-06-07 ENCOUNTER — Ambulatory Visit: Payer: Medicaid Other | Admitting: Cardiology

## 2022-06-10 NOTE — BH Specialist Note (Signed)
Integrated Behavioral Health via Telemedicine Visit  06/10/2022 Stephanie Frazier [id number]  Number of [place] Clinician visits: 5-Fifth Visit  Session Start time: 6301   Session End time: 6010  Total time in minutes: 29   Referring Provider: Darron Doom, MD Patient/Family location: Home [facility] Provider location: [facility] for [place] [facility]  All persons participating in visit: [name] and [place]   Types of Service: Individual psychotherapy and Video visit  I connected with [place] and/or Shiesha C Beed's  n/a  via  Telephone or Video Enabled Telemedicine Application  (Video is Caregility application) and verified that I am speaking with the correct person using two identifiers. Discussed confidentiality: Yes   I discussed the limitations of telemedicine and the availability of in person appointments.  Discussed there is a possibility of technology failure and discussed alternative modes of communication if that failure occurs.  I discussed that engaging in this telemedicine visit, they consent to the provision of behavioral healthcare and the services will be billed under their insurance.  Patient and/or legal guardian expressed understanding and consented to Telemedicine visit: Yes   Presenting Concerns: Patient and/or family reports the following symptoms/concerns: Depression increasing (no SI, no intent, no plan), noticing less interest in preparing food to eat, so eating once or twice daily only. Wednesday "threw up after eating" spontaneously, not forced; migraine began last night. Slept from 2am-6am this morning with "fuzziness" in eyes. Pt uncertain if increase in Zoloft, or thinking about past trauma, has contributed to increase in depression. Pt looking forward to: seeing children this afternoon, time with newly-reconnected supportive family members, final CPS meeting in 1-2 weeks,  and starting ongoing therapy with [facility] on Saturday.  Duration of problem: Postpartum increase; ongoing [place] concerns; Severity of problem:  moderately severe  Patient and/or Family's Strengths/Protective Factors: Social connections, Concrete supports in place (healthy food, safe environments, etc.), Sense of purpose, and Physical Health (exercise, healthy diet, medication compliance, etc.)  Goals Addressed: Patient will:  Reduce symptoms of: anxiety, depression, insomnia, and stress   Increase knowledge and/or ability of: stress reduction   Demonstrate ability to: Increase healthy adjustment to current life circumstances and Increase motivation to adhere to plan of care  Progress towards Goals: Ongoing  Interventions: Interventions utilized:  Medication Monitoring and Supportive Reflection Standardized Assessments completed: Not Needed  Patient and/or Family Response: Patient agrees with treatment plan.   Assessment: Patient currently experiencing Major depressive disorder, recurrent, severe, without psychotic features, Generalized anxiety disorder with panic, Post-traumatic stress disorder, Other specified eating disorder, bulimia type  Patient may benefit from continued therapeutic intervention today, as well as continued psychiatry and ongoing therapy.  Plan: Follow up with behavioral health clinician on : Call Crissa Sowder at [phone], as needed. Behavioral recommendations:  -Continue taking Zoloft as prescribed -Continue plan to attend upcoming cardiology appointment; follow psychiatry recommendations for lab work with cardiology or PCP -Consider prioritizing healthy self-care (regular meals and adequate sleep) for overall wellbeing and to reduce migraine frequency -Continue spending time with supportive people in life at least weekly -Continue plan to see children daily until they are home permanently -Continue plan to attend initial therapy appointment with  [facility] on Saturday, 06/29/22 -Continue plan to follow up with psychiatry after labwork is completed with cardiology or PCP Referral(s): Integrated Orthoptist (In Clinic) and [facility] (LME/Outside Clinic)  I discussed the assessment and treatment plan with the patient  and/or parent/guardian. They were provided an opportunity to ask questions and all were answered. They agreed with the plan and demonstrated an understanding of the instructions.   They were advised to call back or seek an in-person evaluation if the symptoms worsen or if the condition fails to improve as anticipated.  Caroleen Hamman Alysah Carton, LCSW

## 2022-06-17 ENCOUNTER — Ambulatory Visit: Payer: Medicaid Other | Admitting: Obstetrics and Gynecology

## 2022-06-24 ENCOUNTER — Ambulatory Visit (INDEPENDENT_AMBULATORY_CARE_PROVIDER_SITE_OTHER): Payer: Medicaid Other | Admitting: Clinical

## 2022-06-24 DIAGNOSIS — F332 Major depressive disorder, recurrent severe without psychotic features: Secondary | ICD-10-CM

## 2022-06-24 DIAGNOSIS — F4310 Post-traumatic stress disorder, unspecified: Secondary | ICD-10-CM

## 2022-06-24 DIAGNOSIS — F5089 Other specified eating disorder: Secondary | ICD-10-CM

## 2022-06-24 DIAGNOSIS — F410 Panic disorder [episodic paroxysmal anxiety] without agoraphobia: Secondary | ICD-10-CM

## 2022-06-24 NOTE — Patient Instructions (Signed)
Center for [facility] at [facility] [place], [place] [address] [phone] (main office) [phone] [facility] office)  [facility]  [address], [place], [place] [address] [phone] or [phone] [facility] 24/7 FOR ANYONE [address], [place], [place] Fax: [phone] [email or web address] *Interpreters available *Accepts all insurance and uninsured for Urgent Care needs *Accepts Medicaid and uninsured for outpatient treatment (below)    ONLY FOR [facility]  Below:   Outpatient New Patient Assessment/Therapy Walk-ins:        Monday -Thursday 8am until slots are full.        Every Friday 1pm-4pm  (first come, first served)                   New Patient Psychiatry/Medication Management        Monday-Friday 8am-11am (first come, first served)              For all walk-ins we ask that you arrive by 7:15am, because patients will be seen in the order of arrival.

## 2022-06-26 NOTE — Progress Notes (Deleted)
Cardio-Obstetrics Clinic  Follow-up Evaluation  Date:  06/26/2022   ID:  Stephanie Frazier, DOB [birth year], MRN [id number]  PCP:  Pcp, No   [facility] Providers Cardiologist:  None  Electrophysiologist:  None       Referring MD: No ref. provider found   Chief Complaint: Palpitations  History of Present Illness:    Stephanie Frazier is a 21 y.o. female [G2P2002] who presents to clinic for follow-up for palpitations.  Patient has history of anxiety, depression, and gestational HTN.   During initial visit on 11/2021,  the patient was seen for palpitations that had been ongoing for years. Zio monitor 11/2021 which showed NSR, one 5 beat run of NSVT, rare SVE (<1%), rare VE (<1%). No arrhythmias. TTE 12/2021 with LVEF 60-65%, GLS -21.6%, normal RV, mild LAE, no significant valve disease.   Was last seen in clinic on 01/2022 where she was doing well. Palpitations had improved.   Today, ***   Prior CV Studies Reviewed: The following studies were reviewed today: Zio monitor 11/2021: Patch wear time was 6 days and 21 hours Predominant rhythm is sinus with average heart rate of 96bpm One brief run of nonsustained VT lastinc 5 beats Rare SVE (<1%), rare VE (<1%) Patient triggered events correlate mainly with sinus tachycardia No sustained arrhythmias or significant pauses.     Patch Wear Time:  6 days and 21 hours (2023-05-15T16:04:24-0400 to 2023-05-22T14:04:09-398)   Patient had a min HR of 60 bpm, max HR of 200 bpm, and avg HR of 96 bpm. Predominant underlying rhythm was Sinus Rhythm. Slight P wave morphology changes were noted. 1 run of Ventricular Tachycardia occurred lasting 5 beats with a max rate of 200 bpm  (avg 158 bpm). Isolated SVEs were rare (<1.0%), and no SVE Couplets or SVE Triplets were present. Isolated VEs were rare (<1.0%), VE Couplets were rare (<1.0%), and no VE Triplets were present.  TTE 12/2021: IMPRESSIONS     1. Left ventricular ejection fraction,  by estimation, is 60 to 65%. Left  ventricular ejection fraction by 3D volume is 64 %. The left ventricle has  normal function. The left ventricle has no regional wall motion  abnormalities. Left ventricular diastolic   parameters were normal. The average left ventricular global longitudinal  strain is 21.6 %. The global longitudinal strain is normal.   2. Right ventricular systolic function is normal. The right ventricular  size is mildly enlarged. There is normal pulmonary artery systolic  pressure.   3. Left atrial size was mildly dilated.   4. The mitral valve is normal in structure. Trivial mitral valve  regurgitation. No evidence of mitral stenosis.   5. The aortic valve is normal in structure. Aortic valve regurgitation is  trivial. No aortic stenosis is present.   6. The inferior vena cava is normal in size with greater than 50%  respiratory variability, suggesting right atrial pressure of 3 mmHg.   Past Medical History:  Diagnosis Date   Allergy    Anxiety    Depression    Hypertension    Hypertension    Migraine    Migraines    Mood disorder ([place]) 07/20/2015   Movement disorder    Seizures ([place])    last seizure in 2009   Supervision of other high risk pregnancy, antepartum, unspecified trimester 09/27/2021          Nursing Staff  Provider  Office Location  [facility]  Dating   LMP  [facility] Model  [x ]  Traditional  '[ ]'$  Centering  '[ ]'$  Mom-Baby Dyad        Language   English   Anatomy US   normal  Flu Vaccine      Genetic/Carrier Screen   NIPS:   LR female  AFP: negative    Horizon:  TDaP Vaccine    12/27/21  Hgb A1C or   GTT  Early   Third trimester - normal 2hr  COVID Vaccine        LAB RESULTS   Rhogam   NA  B    Past Surgical History:  Procedure Laterality Date   BUNIONECTOMY Right 07/23/2016   BUNIONECTOMY Left 02/2016   FOOT SURGERY     TUBAL LIGATION N/A 03/05/2022   Procedure: POST PARTUM TUBAL LIGATION;  Surgeon: Truett Mainland, DO;  Location: [facility] LD [facility]S;  Service:  Gynecology;  Laterality: N/A;   WISDOM TOOTH EXTRACTION        OB History     Gravida  2   Para  2   Term  2   Preterm      AB      Living  2      SAB      IAB      Ectopic      Multiple  0   Live Births  2               Current Medications: No outpatient medications have been marked as taking for the 06/28/22 encounter (Appointment) with Freada Bergeron, MD.     Allergies:   Morphine and related, Ondansetron hcl, Nifedipine er, Ketorolac, and Watermelon flavor   Social History   Socioeconomic History   Marital status: Soil scientist    Spouse name: Not on file   Number of children: Not on file   Years of education: Not on file   Highest education level: Not on file  Occupational History   Not on file  Tobacco Use   Smoking status: Never    Passive exposure: Past   Smokeless tobacco: Never  Vaping Use   Vaping Use: Never used  Substance and Sexual Activity   Alcohol use: No    Comment: not while preg   Drug use: Never   Sexual activity: Yes  Other Topics Concern   Not on file  Social History Narrative   Not on file   Social Determinants of Health   Financial Resource Strain: Not on file  Food Insecurity: No Food Insecurity (02/28/2022)   Hunger Vital Sign    Worried About Running Out of Food in the Last Year: Never true    Ran Out of Food in the Last Year: Never true  Transportation Needs: No Transportation Needs (02/28/2022)   PRAPARE - Hydrologist (Medical): No    Lack of Transportation (Non-Medical): No  Physical Activity: Not on file  Stress: Not on file  Social Connections: Not on file      Family History  Problem Relation Age of Onset   Seizures Mother    Depression Mother    Asthma Sister    ADD / ADHD Brother    Depression Brother    Migraines Maternal Aunt    Depression Maternal Aunt    Migraines Maternal Uncle    Hypertension Maternal Grandmother    Migraines Maternal Grandmother     Depression Maternal Grandmother    Heart disease Maternal Grandfather    ADD / ADHD Cousin  Many Maternal 1st Cousins have [facility]   Cancer Other    Stroke Neg Hx       ROS:   Please see the history of present illness.    Denies: vision changes, headaches  All other systems reviewed and are negative.   Labs/EKG Reviewed:    EKG:   EKG ordered today.  The ekg ordered today demonstrates sinus tachycardia at 103 BMP, largely unchanged from prior ECGs.  Recent Labs: 03/05/2022: ALT 16; BUN 15; Creatinine, Ser 0.72; Potassium 4.1; Sodium 135 03/06/2022: Hemoglobin 10.6; Platelets 168   Recent Lipid Panel Lab Results  Component Value Date/Time   CHOL 118 08/06/2017 06:28 AM   TRIG 91 08/06/2017 06:28 AM   HDL 37 (L) 08/06/2017 06:28 AM   CHOLHDL 3.2 08/06/2017 06:28 AM   LDLCALC 63 08/06/2017 06:28 AM    Physical Exam:    VS:  There were no vitals taken for this visit.    Wt Readings from Last 3 Encounters:  04/26/22 215 lb 11.2 oz (97.8 kg)  04/12/22 214 lb (97.1 kg)  03/13/22 212 lb 14.4 oz (96.6 kg)     GEN:  Comfortable, NAD HEENT: Normal NECK: No JVD; No carotid bruits CARDIAC: RR, 2/6 systolic murmur LUSB RESPIRATORY:  CTAB ABDOMEN: Gravid MUSCULOSKELETAL:  No edema; No deformity  SKIN: Warm and dry NEUROLOGIC:  Alert and oriented x 3 PSYCHIATRIC:  Normal affect      ASSESSMENT & PLAN:    #Palpitations: Reassuring work-up. Zio monitor with NSR, rare SVE, rare VE, no arrhythmias. TTE with LVEF 60-65%, mild LAE, no significant valve disease. Likely will improve post-partum.  #History of Gestational HTN: #Elevated blood pressure: Higher risk of pre-eclampsia. ***  #Murmur, Hx of abnormal echo TTE with LVEF 60-65%, no significant valve disease.   #Lightheadedness: #Orthostasis of pregnancy: Had episodes with prior pregnancy and continues to have lightheadedness with position changes and prolonged standing with current pregnancy. No episodes of  syncope recently. Has been trying to remain hydrated. States that electrolyte drinks and compression socks do not work for her. Will continue with conservative management.  There are no Patient Instructions on file for this visit.   Dispo:  No follow-ups on file.   Medication Adjustments/Labs and Tests Ordered: Current medicines are reviewed at length with the patient today.  Concerns regarding medicines are outlined above.  Tests Ordered: No orders of the defined types were placed in this encounter.  Medication Changes: No orders of the defined types were placed in this encounter.

## 2022-06-28 ENCOUNTER — Ambulatory Visit: Payer: Medicaid Other | Attending: Cardiology

## 2022-06-28 ENCOUNTER — Ambulatory Visit (INDEPENDENT_AMBULATORY_CARE_PROVIDER_SITE_OTHER): Payer: Medicaid Other | Admitting: Cardiology

## 2022-06-28 ENCOUNTER — Encounter: Payer: Self-pay | Admitting: Cardiology

## 2022-06-28 VITALS — BP 140/85 | HR 85 | Ht 67.0 in | Wt 216.0 lb

## 2022-06-28 DIAGNOSIS — R42 Dizziness and giddiness: Secondary | ICD-10-CM

## 2022-06-28 DIAGNOSIS — R002 Palpitations: Secondary | ICD-10-CM | POA: Diagnosis not present

## 2022-06-28 DIAGNOSIS — F333 Major depressive disorder, recurrent, severe with psychotic symptoms: Secondary | ICD-10-CM

## 2022-06-28 DIAGNOSIS — I10 Essential (primary) hypertension: Secondary | ICD-10-CM

## 2022-06-28 DIAGNOSIS — O133 Gestational [pregnancy-induced] hypertension without significant proteinuria, third trimester: Secondary | ICD-10-CM

## 2022-06-28 DIAGNOSIS — F5089 Other specified eating disorder: Secondary | ICD-10-CM | POA: Diagnosis not present

## 2022-06-28 NOTE — Progress Notes (Signed)
Cardio-Obstetrics Clinic  Follow-up Evaluation  Date:  06/28/2022   ID:  Stephanie Frazier, DOB [birth year], MRN [id number]  PCP:  Pcp, No   [facility] Providers Cardiologist:  None  Electrophysiologist:  None       Referring MD: No ref. provider found   Chief Complaint: Palpitations  History of Present Illness:    Stephanie Frazier is a 21 y.o. female [G2P2002] who presents to clinic for follow-up for palpitations.  Patient has history of anxiety, depression, and gestational HTN.   During initial visit on 11/2021,  the patient was seen for palpitations that had been ongoing for years. Zio monitor 11/2021 which showed NSR, one 5 beat run of NSVT, rare SVE (<1%), rare VE (<1%). No arrhythmias. TTE 12/2021 with LVEF 60-65%, GLS -21.6%, normal RV, mild LAE, no significant valve disease.   Was last seen in clinic on 01/2022 where she was doing well. Palpitations had improved.   Today, she presents for follow-up of palpitations.  They have worsened a little bit lately, she notices a "sluggish" feeling and fluttering feeling.  She says that she has been struggling with mental health (depression, anxiety) and a lot of troubles at home lately.  She is only eating 1 or 2 times a day.  Denies any shortness of breath or dizziness. She is following very closely with behavioral health.  She is in therapy twice weekly, taking Zoloft 100 mg daily. Does not yet have PCP.   Prior CV Studies Reviewed: The following studies were reviewed today: Zio monitor 11/2021: Patch wear time was 6 days and 21 hours Predominant rhythm is sinus with average heart rate of 96bpm One brief run of nonsustained VT lastinc 5 beats Rare SVE (<1%), rare VE (<1%) Patient triggered events correlate mainly with sinus tachycardia No sustained arrhythmias or significant pauses.     Patch Wear Time:  6 days and 21 hours (2023-05-15T16:04:24-0400 to 2023-05-22T14:04:09-398)   Patient had a min HR of 60 bpm, max HR  of 200 bpm, and avg HR of 96 bpm. Predominant underlying rhythm was Sinus Rhythm. Slight P wave morphology changes were noted. 1 run of Ventricular Tachycardia occurred lasting 5 beats with a max rate of 200 bpm  (avg 158 bpm). Isolated SVEs were rare (<1.0%), and no SVE Couplets or SVE Triplets were present. Isolated VEs were rare (<1.0%), VE Couplets were rare (<1.0%), and no VE Triplets were present.  TTE 12/2021: IMPRESSIONS     1. Left ventricular ejection fraction, by estimation, is 60 to 65%. Left  ventricular ejection fraction by 3D volume is 64 %. The left ventricle has  normal function. The left ventricle has no regional wall motion  abnormalities. Left ventricular diastolic   parameters were normal. The average left ventricular global longitudinal  strain is 21.6 %. The global longitudinal strain is normal.   2. Right ventricular systolic function is normal. The right ventricular  size is mildly enlarged. There is normal pulmonary artery systolic  pressure.   3. Left atrial size was mildly dilated.   4. The mitral valve is normal in structure. Trivial mitral valve  regurgitation. No evidence of mitral stenosis.   5. The aortic valve is normal in structure. Aortic valve regurgitation is  trivial. No aortic stenosis is present.   6. The inferior vena cava is normal in size with greater than 50%  respiratory variability, suggesting right atrial pressure of 3 mmHg.   Past Medical History:  Diagnosis Date   Allergy  Anxiety    Depression    Hypertension    Hypertension    Migraine    Migraines    Mood disorder ([place]) 07/20/2015   Movement disorder    Seizures ([place])    last seizure in 2009   Supervision of other high risk pregnancy, antepartum, unspecified trimester 09/27/2021          Nursing Staff  Provider  Office Location  [facility]  Dating   LMP  [facility] Model  [x ] Traditional  _0  Centering  _1  Mom-Baby Dyad        Language   English   Anatomy US   normal  Flu Vaccine       Genetic/Carrier Screen   NIPS:   LR female  AFP: negative    Horizon:  TDaP Vaccine    12/27/21  Hgb A1C or   GTT  Early   Third trimester - normal 2hr  COVID Vaccine        LAB RESULTS   Rhogam   NA  B    Past Surgical History:  Procedure Laterality Date   BUNIONECTOMY Right 07/23/2016   BUNIONECTOMY Left 02/2016   FOOT SURGERY     TUBAL LIGATION N/A 03/05/2022   Procedure: POST PARTUM TUBAL LIGATION;  Surgeon: Truett Mainland, DO;  Location: [facility] LD [facility]S;  Service: Gynecology;  Laterality: N/A;   WISDOM TOOTH EXTRACTION        OB History     Gravida  2   Para  2   Term  2   Preterm      AB      Living  2      SAB      IAB      Ectopic      Multiple  0   Live Births  2               Current Medications: Current Meds  Medication Sig   sertraline (ZOLOFT) 100 MG tablet Take 1 tablet (100 mg total) by mouth daily.     Allergies:   Morphine and related, Ondansetron hcl, Nifedipine er, Ketorolac, and Watermelon flavor   Social History   Socioeconomic History   Marital status: Soil scientist    Spouse name: Not on file   Number of children: Not on file   Years of education: Not on file   Highest education level: Not on file  Occupational History   Not on file  Tobacco Use   Smoking status: Never    Passive exposure: Past   Smokeless tobacco: Never  Vaping Use   Vaping Use: Never used  Substance and Sexual Activity   Alcohol use: No    Comment: not while preg   Drug use: Never   Sexual activity: Yes  Other Topics Concern   Not on file  Social History Narrative   Not on file   Social Determinants of Health   Financial Resource Strain: Not on file  Food Insecurity: No Food Insecurity (02/28/2022)   Hunger Vital Sign    Worried About Running Out of Food in the Last Year: Never true    Ran Out of Food in the Last Year: Never true  Transportation Needs: No Transportation Needs (02/28/2022)   PRAPARE - Hydrologist  (Medical): No    Lack of Transportation (Non-Medical): No  Physical Activity: Not on file  Stress: Not on file  Social Connections: Not on file  Family History  Problem Relation Age of Onset   Seizures Mother    Depression Mother    Asthma Sister    ADD / ADHD Brother    Depression Brother    Migraines Maternal Aunt    Depression Maternal Aunt    Migraines Maternal Uncle    Hypertension Maternal Grandmother    Migraines Maternal Grandmother    Depression Maternal Grandmother    Heart disease Maternal Grandfather    ADD / ADHD Cousin        Many Maternal 1st Cousins have [place]   Cancer Other    Stroke Neg Hx       ROS:   Please see the history of present illness.    Denies: vision changes, headaches  All other systems reviewed and are negative.   Labs/EKG Reviewed:    EKG:   EKG ordered today.  The ekg ordered today demonstrates sinus tachycardia at 103 BMP, largely unchanged from prior ECGs.  Recent Labs: 03/05/2022: ALT 16; BUN 15; Creatinine, Ser 0.72; Potassium 4.1; Sodium 135 03/06/2022: Hemoglobin 10.6; Platelets 168   Recent Lipid Panel Lab Results  Component Value Date/Time   CHOL 118 08/06/2017 06:28 AM   TRIG 91 08/06/2017 06:28 AM   HDL 37 (L) 08/06/2017 06:28 AM   CHOLHDL 3.2 08/06/2017 06:28 AM   LDLCALC 63 08/06/2017 06:28 AM    Physical Exam:    VS:  BP (!) 140/85   Pulse 85   Ht _0  (1.702 m)   Wt 216 lb (98 kg)   LMP 06/23/2022 (Approximate)   SpO2 98%   BMI 33.83 kg/m     Wt Readings from Last 3 Encounters:  06/28/22 216 lb (98 kg)  04/26/22 215 lb 11.2 oz (97.8 kg)  04/12/22 214 lb (97.1 kg)     GEN:  Comfortable, NAD HEENT: Normal NECK: No JVD; No carotid bruits CARDIAC: RRR, 2/6 systolic murmur LUSB RESPIRATORY:  Clear bilaterally ABDOMEN: Soft, ND MUSCULOSKELETAL:  No edema; No deformity  SKIN: Warm and dry NEUROLOGIC:  Alert and oriented x 3 PSYCHIATRIC:  Normal affect      ASSESSMENT & PLAN:     #Palpitations: Reassuring work-up. Zio monitor with NSR, rare SVE, rare VE, no arrhythmias. TTE with LVEF 60-65%, mild LAE, no significant valve disease.Continued palpitations, likely in lieu of eating disorder and increased stresses in the home. Arranged for close PCP follow-up today -Obtain CBC, CMP, Vit B12, Phos, Mag, Vitamin D, lipase, lipid panel -Plan to establish care with [facility]vis [facility]ill at [facility]  #Murmur, Hx of abnormal echo TTE with LVEF 60-65%, no significant valve disease.   #Depression/Anxiety/History of PTSD/eating disorder: -Following closely with behavioral health -Will obtain requested labs today as detailed above   Patient Instructions  Medication Instructions:   Your physician recommends that you continue on your current medications as directed. Please refer to the Current Medication list given to you today.  *If you need a refill on your cardiac medications before your next appointment, please call your pharmacy*   Lab Work:  TODAY AT [name] d, vitamin b12, phosphorus, magnesium, CMP, CBC, iron panel, TSH, lipid panel, lipase  If you have labs (blood work) drawn today and your tests are completely normal, you will receive your results only by: [place] (if you have MyChart) OR A paper copy in the mail If you have any lab test that is abnormal or we need to change your treatment, we will  call you to review the results.    Follow-Up:  AS NEEDED WITH DR. Johney Frame    Important Information About Sugar         Dispo:  No follow-ups on file.   Medication Adjustments/Labs and Tests Ordered: Current medicines are reviewed at length with the patient today.  Concerns regarding medicines are outlined above.  Tests Ordered: Orders Placed This Encounter  Procedures   Lipase   Lipid Profile   Vitamin D 1,25 dihydroxy   B12   Phosphorus   Magnesium   CBC   Comprehensive metabolic  panel   Vitamin D 1,25 dihydroxy   B12   Phosphorus   Magnesium   Comp Met (CMET)   CBC   Iron, TIBC and Ferritin Panel   TSH   Lipid Profile   Lipase   Medication Changes: No orders of the defined types were placed in this encounter.

## 2022-06-28 NOTE — Patient Instructions (Addendum)
Medication Instructions:   Your physician recommends that you continue on your current medications as directed. Please refer to the Current Medication list given to you today.  *If you need a refill on your cardiac medications before your next appointment, please call your pharmacy*   Lab Work:  TODAY AT [place] d, vitamin b12, phosphorus, magnesium, CMP, CBC, iron panel, TSH, lipid panel, lipase  If you have labs (blood work) drawn today and your tests are completely normal, you will receive your results only by: [place] (if you have MyChart) OR A paper copy in the mail If you have any lab test that is abnormal or we need to change your treatment, we will call you to review the results.    Follow-Up:  AS NEEDED WITH DR. Johney Frame    Important Information About Sugar

## 2022-06-29 ENCOUNTER — Other Ambulatory Visit (HOSPITAL_COMMUNITY): Payer: Self-pay | Admitting: Psychiatry

## 2022-06-29 DIAGNOSIS — F53 Postpartum depression: Secondary | ICD-10-CM

## 2022-07-06 LAB — COMPREHENSIVE METABOLIC PANEL
ALT: 39 IU/L — ABNORMAL HIGH (ref 0–32)
AST: 27 IU/L (ref 0–40)
Albumin/Globulin Ratio: 1.4 (ref 1.2–2.2)
Albumin: 4.3 g/dL (ref 4.0–5.0)
Alkaline Phosphatase: 142 IU/L — ABNORMAL HIGH (ref 44–121)
BUN/Creatinine Ratio: 15 (ref 9–23)
BUN: 12 mg/dL (ref 6–20)
Bilirubin Total: <0.2 mg/dL (ref 0.0–1.2)
CO2: 23 mmol/L (ref 20–29)
Calcium: 9.8 mg/dL (ref 8.7–10.2)
Chloride: 102 mmol/L (ref 96–106)
Creatinine, Ser: 0.81 mg/dL (ref 0.57–1.00)
Globulin, Total: 3 g/dL (ref 1.5–4.5)
Glucose: 91 mg/dL (ref 70–99)
Potassium: 4.1 mmol/L (ref 3.5–5.2)
Sodium: 139 mmol/L (ref 134–144)
Total Protein: 7.3 g/dL (ref 6.0–8.5)
eGFR: 105 mL/min/{1.73_m2} (ref >59)

## 2022-07-06 LAB — VITAMIN B12: Vitamin B-12: 1174 pg/mL (ref 232–1245)

## 2022-07-06 LAB — LIPID PANEL
Chol/HDL Ratio: 4 ratio (ref 0.0–4.4)
Cholesterol, Total: 148 mg/dL (ref 100–199)
HDL: 37 mg/dL — ABNORMAL LOW (ref >39)
LDL Chol Calc (NIH): 87 mg/dL (ref 0–99)
Triglycerides: 133 mg/dL (ref 0–149)
VLDL Cholesterol Cal: 24 mg/dL (ref 5–40)

## 2022-07-06 LAB — CBC
Hematocrit: 41.5 % (ref 34.0–46.6)
Hemoglobin: 13.6 g/dL (ref 11.1–15.9)
MCH: 28.6 pg (ref 26.6–33.0)
MCHC: 32.8 g/dL (ref 31.5–35.7)
MCV: 87 fL (ref 79–97)
Platelets: 322 10*3/uL (ref 150–450)
RBC: 4.75 x10E6/uL (ref 3.77–5.28)
RDW: 12.9 % (ref 11.7–15.4)
WBC: 8.1 10*3/uL (ref 3.4–10.8)

## 2022-07-06 LAB — IRON,TIBC AND FERRITIN PANEL
Ferritin: 29 ng/mL (ref 15–150)
Iron Saturation: 13 % — ABNORMAL LOW (ref 15–55)
Iron: 47 ug/dL (ref 27–159)
Total Iron Binding Capacity: 357 ug/dL (ref 250–450)
UIBC: 310 ug/dL (ref 131–425)

## 2022-07-06 LAB — TSH: TSH: 0.715 u[IU]/mL (ref 0.450–4.500)

## 2022-07-06 LAB — PHOSPHORUS: Phosphorus: 3.4 mg/dL (ref 3.0–4.3)

## 2022-07-06 LAB — MAGNESIUM: Magnesium: 2.2 mg/dL (ref 1.6–2.3)

## 2022-07-06 LAB — VITAMIN D 1,25 DIHYDROXY
Vitamin D 1, 25 (OH)2 Total: 125 pg/mL — ABNORMAL HIGH
Vitamin D2 1, 25 (OH)2: <10 pg/mL
Vitamin D3 1, 25 (OH)2: 123 pg/mL

## 2022-07-06 LAB — LIPASE: Lipase: 23 U/L (ref 14–72)

## 2022-07-08 ENCOUNTER — Encounter: Payer: Self-pay | Admitting: Cardiology

## 2022-07-08 DIAGNOSIS — R002 Palpitations: Secondary | ICD-10-CM

## 2022-07-09 ENCOUNTER — Ambulatory Visit: Payer: Medicaid Other | Attending: Cardiology

## 2022-07-09 DIAGNOSIS — R002 Palpitations: Secondary | ICD-10-CM

## 2022-07-09 NOTE — Progress Notes (Unsigned)
Enrolled for [employer] to mail a ZIO XT long term holter monitor to the patients address on file.  

## 2022-07-10 NOTE — Telephone Encounter (Signed)
-----   Message from Jennefer Bravo sent at 07/10/2022 10:03 AM EST ----- Regarding: RE: 3 day zio per Dr. Johney Frame done ----- Message ----- From: Nuala Alpha, LPN Sent: 01/65/5374   9:30 AM EST To: Jennefer Bravo; Katrina Cloyd Stagers Subject: 3 day zio per Dr. Johney Frame                    Dr. Johney Frame ordered for this pt to have another 3 day zio for acute complaints of palpitations.  She is one of our Cardio OB pts.  Order is in.  Can you please enroll the pt and let me know when you do?  Thanks  [facility]

## 2022-07-11 DIAGNOSIS — R002 Palpitations: Secondary | ICD-10-CM | POA: Diagnosis not present

## 2022-07-12 ENCOUNTER — Encounter (HOSPITAL_COMMUNITY): Payer: Self-pay | Admitting: Psychiatry

## 2022-07-25 ENCOUNTER — Ambulatory Visit: Payer: Medicaid Other | Admitting: Student

## 2022-07-25 ENCOUNTER — Encounter: Payer: Self-pay | Admitting: Student

## 2022-07-25 VITALS — BP 122/82 | HR 89 | Ht 68.0 in | Wt 212.0 lb

## 2022-07-25 DIAGNOSIS — Z23 Encounter for immunization: Secondary | ICD-10-CM | POA: Diagnosis present

## 2022-07-25 DIAGNOSIS — F411 Generalized anxiety disorder: Secondary | ICD-10-CM

## 2022-07-25 DIAGNOSIS — F410 Panic disorder [episodic paroxysmal anxiety] without agoraphobia: Secondary | ICD-10-CM | POA: Diagnosis not present

## 2022-07-25 MED ORDER — BUSPIRONE HCL 7.5 MG PO TABS: 7.5000 mg | ORAL_TABLET | Freq: Two times a day (BID) | ORAL | 1 refills | Status: DC

## 2022-07-25 NOTE — Progress Notes (Signed)
    SUBJECTIVE:   CHIEF COMPLAINT / HPI:   Stephanie Frazier is a very pleasant 22 year old female here to establish care with new PCP.  She follows with Dr. Gwyndolyn Kaufman with cardiology for heart palpitations and gestational hypertension.  She was recently having a difficult CPS custody battle, but she now has her children returned to the home which she is very happy about.  Will use 23 years old and wound is 48 months old.    Recently increased to 100 mg daily from 50 mg daily.  She feels like this is helpful.  She does have a history of bulimia and other restrictive binging/purging eating habits, for which she follows with psychiatry. She has a lot of insecurity especially in light of childhood traumas.  Occupation: Stay at home Mom Lives with: Daughters and Building services engineer, finacee Smoking/Vaping:  None Alcohol: None Marijuana/ilicit substances:  None   Allergies: Morphine, Toradol and Zofran Daily medications: Zoloft 100 mg daily Medical diagnoses: Palpitations, Depression, Seizures (last seizure at 22 years old) Surgical hx: Foot surgeries (bunion removal) x2, Tubal ligation Family hx:  Heart disease maternal side (HTN, CAD). Both aunts born with "hole in heart." Mom with HLD, T2DM, seizure disorder. Great grandmother breast and uterine cancer   Will get COVID and Flu vaccines.  PERTINENT  PMH / PSH: Reviewed  OBJECTIVE:   BP 122/82   Pulse 89   Ht '5\' 8"'$  (1.727 m)   Wt 212 lb (96.2 kg)   LMP 06/23/2022 (Approximate)   SpO2 98%   BMI 32.23 kg/m   General: Alert and cooperative and appears to be in no acute distress Cardio: Normal S1 and S2, no S3 or S4. Rhythm is regular. No murmurs or rubs.   Pulm: Clear to auscultation bilaterally, no crackles, wheezing, or diminished breath sounds. Normal respiratory effort Abdomen: Bowel sounds normal. Abdomen soft and non-tender.  Extremities: No peripheral edema. Warm/ well perfused.  Strong radial pulses. Neuro: Cranial nerves grossly  intact ASSESSMENT/PLAN:   Generalized anxiety disorder with panic attacks Understandably has high degree of anxiety, especially recent life stressors.  Denies any SI or HI. We made a joint decision to add BuSpar 7.5 mg twice daily to see if this will have any improvement in her anxiety. Continue Zoloft 100 mg daily. Also encouraged her to make an appointment with the therapist and discussed that medication plus CBT is most beneficial. Follow-up in 1 month.   We reviewed all of the labs that were collected in December 2020 per recommendation from psychiatrist including CBC, CMP, TSH, lipase, lipid profile, iron studies, magnesium, phosphorus and vitamin D. Everything was overall within normal limits.  Orvis Brill, [place]

## 2022-07-25 NOTE — Patient Instructions (Addendum)
So wonderful seeing you today Lexie.  You received your COVID and flu shots today. Your arm may be sore and you may have a low grade fever- you can take Tylenol or Advil for this.  I sent in Buspar for anxiety. Take this twice daily.  Please schedule an appointment to return in 4-6 weeks so I can see how you are doing!   If you have any questions or concerns, please feel free to call the clinic.   Have a wonderful day,  Dr. Orvis Brill [facility] [phone]      Therapy and Counseling Resources Most providers on this list will take Medicaid. Patients with commercial insurance or Medicare should contact their insurance company to get a list of in network providers.  [employer] (takes children) Location 1: [address], [place], [place] [address] Location 2: [place], [place] [address] [place] ([place] speaking therapist available)(habla espanol)(take medicare and medicaid)  [place], [place], [place] [address], [place] [email or web address] [phone]  [facility]:Psychiatry and Counseling [address] [place], [place] [address] [place], [place], [place] [address]      [phone]  [place] (spanish available) [place], [place] [address] [place] (take [employer] and medicare) [address], [place],  [address]       [phone]     [place] (virtual only) [phone]  Jinny Blossom [facility] [address] [place], [place]  Family Solutions:  [place]. [place] [phone]  Journeys Counseling:  [place] [address] Fate [phone]  [facility] (under & uninsured) [address], [place] [phone]    [email or web address]     [place] [address]  [place]    [phone]  [facility] [place]     Phone:  [phone]     [place]  [place] [address]      [place], [place] ext [place]: [place], [place], [place]   [place] ([place] therapist) [email or web address]  [place] [address]   [place] [address]    [phone]    [facility]   [address],  [place], [place]   [place]  [place]  [facility]  [address] [place], [place]        [phone]  Open Access/Walk In Clinic under & uninsured  [facility]  [address] [place], [place] Crisis [phone]  Family Service of the [place],  ([place])   [place], [place]: [phone]) 8:30 - 12; 1 - 2:30  Family Service of the [place],  [place], [place]    [phone]):8:30 - 12; 2 - 3PM  [facility],  [address]  [place]; [phone]):   Mon - Fri 8 AM - 5 PM  [facility] [place]  MWF 12:30 to 3:00 or call to schedule an appointment  [phone]  Specific Provider  options Psychology Today  [email or web address] click on find a therapist  enter your zip code left side and select or tailor a therapist for your specific need.   [facility] Provider Directory [email or web address]  (Medicaid)   Follow all drop down to find a provider  [place] or [email or web address] [address], [place], [place] Recovery support and educational   24- Hour Availability:   [facility]  [address] [place], [place] Crisis [phone]  Family Service of the [employer] [phone]  [place]  [phone]   [place]  [phone] (after hours)  Therapeutic Alternative/Mobile Crisis   [phone]  [place] National Suicide Hotline  873-694-3217 Diamantina Monks)  Call 911 or go to emergency room  [facility]  [phone]);  [place] and [employer]  [phone]); [place], [place], [place], [place], [place], [place], [place]

## 2022-07-28 NOTE — Assessment & Plan Note (Addendum)
Understandably has high degree of anxiety, especially recent life stressors.  Denies any SI or HI. We made a joint decision to add BuSpar 7.5 mg twice daily to see if this will have any improvement in her anxiety. Continue Zoloft 100 mg daily. Also encouraged her to make an appointment with the therapist and discussed that medication plus CBT is most beneficial. Follow-up in 1 month.

## 2022-07-30 ENCOUNTER — Telehealth (HOSPITAL_COMMUNITY): Payer: Medicaid Other | Admitting: Psychiatry

## 2022-08-22 ENCOUNTER — Other Ambulatory Visit: Payer: Self-pay | Admitting: Student

## 2022-08-23 ENCOUNTER — Ambulatory Visit: Payer: Medicaid Other | Admitting: Family Medicine

## 2022-08-23 ENCOUNTER — Other Ambulatory Visit: Payer: Self-pay

## 2022-08-23 VITALS — BP 147/95 | HR 91 | Ht 68.0 in | Wt 218.4 lb

## 2022-08-23 DIAGNOSIS — R070 Pain in throat: Secondary | ICD-10-CM | POA: Diagnosis present

## 2022-08-23 LAB — POCT RAPID STREP A (OFFICE): Rapid Strep A Screen: NEGATIVE

## 2022-08-23 NOTE — Assessment & Plan Note (Signed)
Patient presents with sore throat and cough for the past 3 days. Afebrile. Physical exam unremarkable. Symptoms likely due to a viral infection.Opted for strep testing per patient request which was negative. Recommended supportive care with tea, honey, throat lozenges, Tylenol/Ibuprofen as needed for pain. Return precautions discussed.

## 2022-08-23 NOTE — Patient Instructions (Addendum)
It was great seeing you today!  Im sorry you haven't been feeling well. We checked for strep Infection which was negative.   I recommend tea, honey to coat the throat, throat lozenges. Tylenol or Ibuprofen as needed for pain.   Follow up with your PCP in the next couple of weeks to discuss your blood pressure   Feel free to call with any questions or concerns at any time, at [phone].   Take care,  Dr. Shary Key [facility]

## 2022-08-23 NOTE — Progress Notes (Unsigned)
    SUBJECTIVE:   CHIEF COMPLAINT / HPI:   Patient presents for sore throat, cough. Tuesday night felt she couldn't breathe because of her throat. Symptoms started Sunday. Denies emesis, body aches, diarrhea or constipation. States she is able to swallow but sometimes it hurts. Able to eat and drink with occasional pain. No sick contacts.    PERTINENT  PMH / PSH: Reviewed   OBJECTIVE:   BP (!) 147/95   Pulse 91   Ht '5\' 8"'$  (1.727 m)   Wt 218 lb 6.4 oz (99.1 kg)   SpO2 96%   BMI 33.21 kg/m    Physical exam General: well appearing, NAD HEENT: MMM. Oropharynx non erythematous and without exudates.  Cardiovascular: RRR, no murmurs Lungs: CTAB. Normal WOB Abdomen: soft, non-distended, non-tender Skin: warm, dry. No edema  ASSESSMENT/PLAN:   Throat pain Patient presents with sore throat and cough for the past 3 days. Afebrile. Physical exam unremarkable. Symptoms likely due to a viral infection.Opted for strep testing per patient request which was negative. Recommended supportive care with tea, honey, throat lozenges, Tylenol/Ibuprofen as needed for pain. Return precautions discussed.    BP elevated today, recommended following up with PCP to discuss potential treatment.   [place]

## 2022-09-13 ENCOUNTER — Ambulatory Visit (INDEPENDENT_AMBULATORY_CARE_PROVIDER_SITE_OTHER): Payer: Medicaid Other | Admitting: Student

## 2022-09-13 ENCOUNTER — Encounter: Payer: Self-pay | Admitting: Student

## 2022-09-13 VITALS — BP 142/94 | HR 82 | Ht 68.0 in | Wt 217.0 lb

## 2022-09-13 DIAGNOSIS — L989 Disorder of the skin and subcutaneous tissue, unspecified: Secondary | ICD-10-CM

## 2022-09-13 DIAGNOSIS — F333 Major depressive disorder, recurrent, severe with psychotic symptoms: Secondary | ICD-10-CM

## 2022-09-13 MED ORDER — BUPROPION HCL ER (XL) 150 MG PO TB24: 150.0000 mg | ORAL_TABLET | Freq: Every day | ORAL | 3 refills | Status: DC

## 2022-09-13 NOTE — Patient Instructions (Addendum)
It was great seeing you today.  We took a skin biopsy. I will call you with the results.  START taking Wellbutrin 150 mg daily.  Schedule a 1-2 month follow-up with me.   If you have any questions or concerns, please feel free to call the clinic.   Have a wonderful day,  Dr. Orvis Brill [facility] [phone]     Therapy and Counseling Resources Most providers on this list will take Medicaid. Patients with commercial insurance or Medicare should contact their insurance company to get a list of in network providers.  [facility] (takes children) Location 1: [address] [place], [place] [address] Location 2: [place], [place] [address] [place] ([place] speaking therapist available)(habla espanol)(take medicare and medicaid)  [place], [place], [place] [address], [place] [email or web address] [phone]  [facility]:Psychiatry and Counseling [address] [place], [place] [address] [place], [place], [place] [address]      [phone]  [place] (spanish available) [place], [place] [address] [place] (take [facility] and medicare) [address], [place], [place] [address]       [phone]     [place] (virtual only) [phone]  Jinny Blossom Total Access [facility] [address], [place], [place]  Family Solutions:  [place]. [place] [phone]  Journeys Counseling:  [place] [address] Fate [phone]  [facility] (under & uninsured) [address] [place] [phone]    [email or web address]    [address]  [place]    [phone]  [facility] [place]     Phone:  [phone]     [place]  [place] [address]      [place], [place] ext [place]: [place], [place], [place]   [place] ([place] therapist) [email or web address]  [place] [address]   [place] [address]    [phone]    [facility]   [address],  [place], [place]   [place]  [place]  [facility]  [address] [place], [place]        [phone]  Open Access/Walk In Clinic under & uninsured  [facility]  [address] [place] [address] [place], [place] Crisis [phone]  Family Service of the [place],  ([place])   [place], [place]: [phone]) 8:30 - 12; 1 - 2:30  Family Service of the [place],  [place], [place]    [phone]):8:30 - 12; 2 - 3PM  [facility],  [address],  [place]; [phone]):   Mon - Fri 8 AM - 5 PM  Alcohol & Drug Services [place]  MWF 12:30 to 3:00 or call to schedule an appointment  [phone]  Specific Provider options Psychology Today  [email or web address] click on find a therapist  enter your zip code left side and select or tailor a therapist for your specific need.   [facility] Provider  Directory [email or web address]  (Medicaid)   Follow all drop down to find a provider  [place] or [email or web address] [address], [place], [place] Recovery support and educational   24- Hour Availability:   [facility]  [address] [place], [place] Crisis [phone]  Family Service of the [employer]  [phone]  [place]  [phone]   [place]  [phone] (after hours)  Therapeutic Alternative/Mobile Crisis   [phone]  [place] National Suicide Hotline  [phone] Diamantina Monks)  Call 911 or go to emergency room  [facility]  [phone]);  [place] and [employer]  [phone]); [place], [place], [place], [place], [place], [place], [place]

## 2022-09-13 NOTE — Progress Notes (Signed)
    SUBJECTIVE:   CHIEF COMPLAINT / HPI:   Stephanie Frazier is a 22 year old female here for follow-up.  Mood changes Still endorsing low energy with depression and anxiety Taking Zoloft 100 mg daily without much benefit  She was going to therapy which is very beneficial for her, but apparently her insurance is not covered through this provider therefore she received a large bill and would not be able to follow-up with them.  She has both of her children living with her which brings her a lot of joy She and her fianc are getting along well  Skin changes: Says that she has some red itchy bumps on her lower abdomen that extend into her mons pubis that have been there for around 6 months No burning or stinging or purulent discharge. Has not used anything over-the-counter or topical No fevers or chills No changes in detergents, perfumes, etc.  PERTINENT  PMH / PSH: Reviewed  OBJECTIVE:   BP (!) 142/94   Pulse 82   Ht 5' 8"$  (1.727 m)   Wt 217 lb (98.4 kg)   LMP 09/04/2022   SpO2 98%   BMI 32.99 kg/m   General: Well-appearing, pleasant, no distress CV: Regular rate and rhythm Respiratory: Normal work of breathing on room air Skin: Lower abdomen with multiple round, raised, erythematous well-defined lesions measuring 0.25-1 cm in diameter. GU: Dr. Ardelia Mems present as chaperone.  Bilateral labia without lesions.  Mons pubis does have these erythematous well-defined circular lesions as well Extremities: Warm and well-perfused   ASSESSMENT/PLAN:   Severe recurrent major depression with psychotic features (HCC) Chronic.  Generally stable, but patient is most bothered by low energy and depressive symptoms.  No SI/HI. -Continue Zoloft 100 mg daily -Start Wellbutrin 150 mg extended release daily.  Did discuss with patient that this lower seizure threshold and she has a documented history of seizures in childhood, has not had any since then.  She is not on any antiepileptic drugs.  She wishes  to proceed with starting medication -Provided with list of therapy resources that accepts Medicaid -Plan to follow-up in 1 to 2 months  Skin lesions Multiple round, well-defined lesions over the lower abdomen and mons pubis present for 6 months Unclear diagnosis, but consider lichen planus versus psoriasis or other contact irritant dermatitis Punch biopsy completed today by Dr. Ardelia Mems Encouraged to use topical hydrocortisone 1% with emollient       Orvis Brill, [place]

## 2022-09-16 ENCOUNTER — Encounter: Payer: Self-pay | Admitting: Student

## 2022-09-16 DIAGNOSIS — L989 Disorder of the skin and subcutaneous tissue, unspecified: Secondary | ICD-10-CM | POA: Insufficient documentation

## 2022-09-16 NOTE — Assessment & Plan Note (Addendum)
Multiple round, well-defined lesions over the lower abdomen and mons pubis present for 6 months Unclear diagnosis, but consider lichen planus versus psoriasis or other contact irritant dermatitis Punch biopsy completed today by Dr. Ardelia Mems Encouraged to use topical hydrocortisone 1% with emollient

## 2022-09-16 NOTE — Assessment & Plan Note (Signed)
Chronic.  Generally stable, but patient is most bothered by low energy and depressive symptoms.  No SI/HI. -Continue Zoloft 100 mg daily -Start Wellbutrin 150 mg extended release daily.  Did discuss with patient that this lower seizure threshold and she has a documented history of seizures in childhood, has not had any since then.  She is not on any antiepileptic drugs.  She wishes to proceed with starting medication -Provided with list of therapy resources that accepts Medicaid -Plan to follow-up in 1 to 2 months

## 2022-10-07 ENCOUNTER — Other Ambulatory Visit: Payer: Self-pay | Admitting: Student

## 2022-10-23 ENCOUNTER — Ambulatory Visit (INDEPENDENT_AMBULATORY_CARE_PROVIDER_SITE_OTHER): Payer: Medicaid Other | Admitting: Family Medicine

## 2022-10-23 ENCOUNTER — Encounter: Payer: Self-pay | Admitting: Student

## 2022-10-23 VITALS — BP 128/84 | HR 92 | Temp 98.4°F | Ht 68.0 in | Wt 218.2 lb

## 2022-10-23 DIAGNOSIS — J301 Allergic rhinitis due to pollen: Secondary | ICD-10-CM

## 2022-10-23 DIAGNOSIS — H65191 Other acute nonsuppurative otitis media, right ear: Secondary | ICD-10-CM | POA: Diagnosis not present

## 2022-10-23 DIAGNOSIS — B349 Viral infection, unspecified: Secondary | ICD-10-CM

## 2022-10-23 DIAGNOSIS — F333 Major depressive disorder, recurrent, severe with psychotic symptoms: Secondary | ICD-10-CM | POA: Diagnosis not present

## 2022-10-23 MED ORDER — AMOXICILLIN-POT CLAVULANATE 875-125 MG PO TABS: 1.0000 | ORAL_TABLET | Freq: Two times a day (BID) | ORAL | 0 refills | Status: DC

## 2022-10-23 MED ORDER — FLUTICASONE PROPIONATE 50 MCG/ACT NA SUSP: 2.0000 | Freq: Every day | NASAL | 6 refills | Status: AC

## 2022-10-23 NOTE — Assessment & Plan Note (Signed)
PHQ improved, taking medications and tolerating them well

## 2022-10-23 NOTE — Patient Instructions (Signed)
It was wonderful to see you today.  Please bring ALL of your medications with you to every visit.   Today we talked about:  --You have an ear infection  I recommend augmentin twice a day for a week  I sent in flonase for your nose  At nighttime for the next three nights you can use Afrin  Do not use more than 3 days a row  I hope you feel better soon! Push plenty of fluids and be sure to remember to eat   Get as much rest as you can    Thank you for choosing [facility].   Please call [phone] with any questions about today's appointment.  Please be sure to schedule follow up at the front  desk before you leave today.   Dorris Singh, MD  Family Medicine

## 2022-10-23 NOTE — Progress Notes (Signed)
    SUBJECTIVE:   CHIEF COMPLAINT: viral symptoms HPI:   Stephanie Frazier is a 22 y.o.  with history notable for PTSD presenting for cough, cold, and ear pain.   The patient reports 5 days of cold symptoms.  Reports R ear pain, congestion, headache, sore throat, coughing, mild nausea, and lightheadedness. She reports baseline allergies with congestion at times. She has 2 children and a fiance at home. Her two kids are sick and so is fiance. She has tried cough and allergy medication. She is not breastfeeding. She has BTL. Denies shortness of breath or fevers. Reports R ear pain is severe.   She is taking her buspar and zoloft. Has follow up with Dr. Owens Shark.  She had HTN after pregnancy. She had an allergy to nifedipine. No dyspnea  or chest pains. BP improved on repeat    PERTINENT  PMH / PSH/Family/Social History :  Mood disorder BTL Elevated blood pressure readings, gestational HTN   OBJECTIVE:   BP 128/84   Pulse 92   Temp 98.4 F (36.9 C) (Oral)   Ht 5\' 8"  (1.727 m)   Wt 218 lb 3.2 oz (99 kg)   SpO2 96%   BMI 33.18 kg/m   Today's weight:  Last Weight  Most recent update: 10/23/2022  1:48 PM    Weight  99 kg (218 lb 3.2 oz)            Review of prior weights: Filed Weights   10/23/22 1346  Weight: 218 lb 3.2 oz (99 kg)    HEENT: EOMI. Sclera without injection or icterus. MMM. External auditory canal examined and WNL. TM normal appearance, no erythema or bulging on L. R TM bulging with purulence and redness  Throat + postnasal drip Neck: Supple. No LAD  Cardiac: Regular rate and rhythm. Normal S1/S2. No murmurs, rubs, or gallops appreciated. Lungs: Clear bilaterally to ascultation.  Psych: Pleasant and appropriate     10/23/2022    1:50 PM 09/13/2022    3:21 PM 08/23/2022    4:23 PM 07/25/2022    2:36 PM 06/05/2022    1:44 PM  Depression screen PHQ 2/9  Decreased Interest 1 2 2 1    Down, Depressed, Hopeless 1 2 2 1    PHQ - 2 Score 2 4 4 2    Altered sleeping  2 3 2 2    Tired, decreased energy 1 3 3 2    Change in appetite 2 2 1 3    Feeling bad or failure about yourself  1 2 3 2    Trouble concentrating 1 1 1 1    Moving slowly or fidgety/restless 2 2 2 2    Suicidal thoughts 0 0 0 0   PHQ-9 Score 11 17 16 14    Difficult doing work/chores Somewhat difficult Somewhat difficult Very difficult       Information is confidential and restricted. Go to Review Flowsheets to unlock data.     ASSESSMENT/PLAN:   Severe recurrent major depression with psychotic features (HCC) PHQ improved, taking medications and tolerating them well    Acute Otitis Media - Rx Augmentin for 7 days - Flonase for allergies - Reviewed return precautions - Discontinue OTC medications given intermittent BP elevations   Elevated BP Has history of gestational HTN Would monitor closely, improved on repeat Avoid cough medications that elevate BP     Dorris Singh, MD  [place]

## 2022-10-24 ENCOUNTER — Ambulatory Visit: Payer: Medicaid Other

## 2022-10-25 ENCOUNTER — Other Ambulatory Visit: Payer: Self-pay | Admitting: Obstetrics and Gynecology

## 2022-10-25 DIAGNOSIS — F53 Postpartum depression: Secondary | ICD-10-CM

## 2022-11-05 ENCOUNTER — Encounter: Payer: Self-pay | Admitting: Student

## 2022-11-06 ENCOUNTER — Other Ambulatory Visit: Payer: Self-pay

## 2022-11-06 ENCOUNTER — Ambulatory Visit (INDEPENDENT_AMBULATORY_CARE_PROVIDER_SITE_OTHER): Payer: Medicaid Other | Admitting: Family Medicine

## 2022-11-06 ENCOUNTER — Ambulatory Visit (HOSPITAL_COMMUNITY)
Admission: RE | Admit: 2022-11-06 | Discharge: 2022-11-06 | Disposition: A | Discharge status: Home / Self Care | Payer: Medicaid Other | Source: Ambulatory Visit | Attending: Family Medicine | Admitting: Family Medicine

## 2022-11-06 VITALS — BP 138/82 | HR 91 | Wt 217.0 lb

## 2022-11-06 DIAGNOSIS — R002 Palpitations: Secondary | ICD-10-CM

## 2022-11-06 MED ORDER — METOPROLOL SUCCINATE ER 25 MG PO TB24
25.0000 mg | ORAL_TABLET | Freq: Every day | ORAL | 0 refills | Status: DC
Start: 2022-11-06 — End: 2022-11-29

## 2022-11-06 NOTE — Patient Instructions (Signed)
Take one of the metoprolol 25 mg tablets once a day.  This is a trial to see if it can help decrease how often you experience palpitations.    You are starting at a low dose.  If you tolerate the medication, but you are not getting quite enough benefit, you may increase to two tablets once a day.    If it is helping, let Dr Melissa Noon or myself know.  We will refill the prescription.    A referral has been made to Cardiologists.

## 2022-11-06 NOTE — Assessment & Plan Note (Signed)
Recurrent, chronic condition  No red flag symptoms or signs of life-limiting dysrhythmias The only symptoms that could indicate a specific dysrhythmia is the acute onset and cessation of palpitation sensation of something like PSVT.    Stephanie Frazier does not appear convinced that her palpitation sensations are related to mood or stress.  She wants to see cardiology again to see if a longer time of ambulatory telemetry would be more likely to capture a dysrythmia. A referral back to cardiology was made at her request.   Stephanie Frazier agreed to trial of metoprolol succinate 25 mg tablet, one tablet daily to see if it can suppress or diminish the sensation of palpitations.  If she tolerates the metoprolol 25 but has not had significant improviement in palpitation symptoms, she may try trial of 50 mg daily.  If the metoprolol is helpful, we will refill it.

## 2022-11-06 NOTE — Telephone Encounter (Signed)
Spoke with patient. She reports having intermittent issues with palpitations for the last few years. She was seen by Cardiology in 06/2022 for palpitations, however, no abnormal findings.   She reports that palpitations have been worse over the last two days. She also reports lightheadedness since this morning. Due to increased frequency of palpitations and lightheadedness, scheduled for evaluation this afternoon.   ED precautions discussed.   Veronda Prude, RN
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
## 2022-11-15 ENCOUNTER — Ambulatory Visit (INDEPENDENT_AMBULATORY_CARE_PROVIDER_SITE_OTHER): Payer: Medicaid Other | Admitting: Student

## 2022-11-15 ENCOUNTER — Encounter: Payer: Self-pay | Admitting: Student

## 2022-11-15 VITALS — BP 132/73 | HR 79 | Ht 68.0 in | Wt 219.0 lb

## 2022-11-15 DIAGNOSIS — R002 Palpitations: Secondary | ICD-10-CM | POA: Diagnosis not present

## 2022-11-15 DIAGNOSIS — F333 Major depressive disorder, recurrent, severe with psychotic symptoms: Secondary | ICD-10-CM | POA: Diagnosis not present

## 2022-11-15 DIAGNOSIS — F53 Postpartum depression: Secondary | ICD-10-CM

## 2022-11-15 MED ORDER — SERTRALINE HCL 50 MG PO TABS
50.0000 mg | ORAL_TABLET | Freq: Every day | ORAL | 0 refills | Status: DC
Start: 2022-11-15 — End: 2022-12-05

## 2022-11-15 MED ORDER — SERTRALINE HCL 100 MG PO TABS: 100.0000 mg | ORAL_TABLET | Freq: Every day | ORAL | 3 refills | Status: DC

## 2022-11-15 NOTE — Assessment & Plan Note (Signed)
Improved. Continue metoprolol 25 mg daily, which has quite significantly reduced palpitations. Will continue to fill this for her. Has follow-up with cardiology scheduled in June.

## 2022-11-15 NOTE — Patient Instructions (Signed)
It was great to see you! Thank you for allowing me to participate in your care!  Our plans for today:  - Increase your sertraline (Zoloft) to 150 mg daily - Please follow up in 4 weeks for an appointment - Continue your metoprolol 25 mg daily. Go to your cardiologist appointment as scheduled.  Take care and seek immediate care sooner if you develop any concerns.   Dr. Darral Dash, DO [facility]    Therapy and Counseling Resources Most providers on this list will take Medicaid. Patients with commercial insurance or Medicare should contact their insurance company to get a list of in network providers.  [employer] (takes children) Location 1: [address] [place], [place] [address] Location 2: [address] [place], [place] [address] [phone]   [facility] (spanish speaking therapist available)(habla espanol)(take medicare and medicaid)  [address], [place], [place] [address], [email or web address] [email or web address] [phone]  [facility]:Psychiatry and Counseling [address] [facility] [address] [place], [place] [address] [phone]  [facility]   [address], Suite [place], [place] [address]      [phone]  [facility] (spanish available) [address]  [place], [place] [address] [phone]  [facility] (take [facility] and medicare) [address]  [place], [place] [address]       [phone]     [facility] (virtual only) [phone]  Jovita Kussmaul [facility] [address] [place], [place] [phone]  [facility]:  [address] [place] [phone]  [facility]:  [address] [place]ien[place] [phone]  [facility] (under & uninsured) [address]   [place] [phone]    [email or web address]    [facility] [address]  [place]     [phone]  [facility] -[address]     Phone:  [phone]     [facility]-  [address]  [phone]   [facility] [address]      [place], [place] [phone]  [facility]: [address] [place], [place], [place]  [phone]   [facility] (Spanish therapist) [email or web address]  [address] [place]  [address]   [place] [address]    [phone]    [facility]   [address],  [place], [place]  [phone]   [facility]  [address] [place]  [phone]  [facility]  [address] [place], [place]        [phone]  Open Access/Walk In Clinic under & uninsured  [facility]  [address] [place], [place] [phone] Crisis [phone]  [facility] [place],  (Spanish)   [address], [place]: [phone]) 8:30 - 12; 1 - 2:30  [facility] [employer],  [address] [place], [place]    [phone]):8:30 - 12; 2 - 3PM  [facility] [employer],  [address]  [place]; [phone]):   Mon - Fri 8 AM - 5 PM  [facility] [address] [place]  MWF 12:30 to 3:00 or call to schedule an appointment  [phone]  Specific Provider options Psychology Today  [email or web address]/us click on find a therapist  enter your zip code left side and select or tailor  a therapist for your specific need.   [facility] Provider Directory [email or web address]  (Medicaid)   Follow all drop down to find a provider  Social Support program Mental Health [place] [phone] or PhotoSolver.pl [address], [place], [place] Recovery support and educational   24- Hour Availability:   [facility]  [address] [place], [place]  [phone] Crisis [phone]  [facility] [phone]  [facility]  [phone]   [facility] [place]  [phone] (after hours)  Therapeutic Alternative/Mobile Crisis   [phone]  [place] National Suicide Hotline  [phone] Len Childs)  Call 911 or go to emergency room  [facility]  [phone]);  [place] and [employer]  [phone]); [place], [place], [place], [place], [place], [place], [place]

## 2022-11-15 NOTE — Assessment & Plan Note (Signed)
PHQ-9 stable.  Patient feels much improved, but is having some bothersome anxiety like/panic symptoms.  No SI. Increase Zoloft to 150 mg daily Discontinue Wellbutrin given history of bulimia Follow-up in 4 to 6 weeks to monitor

## 2022-11-15 NOTE — Progress Notes (Signed)
    SUBJECTIVE:   CHIEF COMPLAINT / HPI:   Stephanie Frazier is here for follow up.  Feeling the metoprolol 25 mg daily is helping a lot with her palpitations. She is only getting them a few times a day  Taking Zoloft 100 mg daily, along with Wellbutrin.  Had a panic attack on the way here. Felt panicky and frustrated and irritated. If people try to talk or say anything to her, it irritates her as well.  PERTINENT  PMH / PSH: Reviewed  OBJECTIVE:   BP 132/73   Pulse 79   Ht 5\' 8"  (1.727 m)   Wt 219 lb (99.3 kg)   LMP 11/03/2022   SpO2 99%   BMI 33.30 kg/m   General: Alert and cooperative and appears to be in no acute distress Cardio: Normal S1 and S2, no S3 or S4. Rhythm is regular. No murmurs or rubs.   Pulm: Clear to auscultation bilaterally, no crackles, wheezing, or diminished breath sounds. Normal respiratory effort Extremities: No peripheral edema. Warm/ well perfused.  Strong radial pulses. Neuro: Cranial nerves grossly intact Psych: Pleasant. Normal mood and affect   ASSESSMENT/PLAN:   Severe recurrent major depression with psychotic features (HCC) PHQ-9 stable.  Patient feels much improved, but is having some bothersome anxiety like/panic symptoms.  No SI. Increase Zoloft to 150 mg daily Discontinue Wellbutrin given history of bulimia Follow-up in 4 to 6 weeks to monitor  Palpitations Improved. Continue metoprolol 25 mg daily, which has quite significantly reduced palpitations. Will continue to fill this for her. Has follow-up with cardiology scheduled in June.     Darral Dash, DO [facility]

## 2022-11-16 ENCOUNTER — Encounter: Payer: Self-pay | Admitting: Student

## 2022-11-20 ENCOUNTER — Encounter: Payer: Self-pay | Admitting: Student

## 2022-11-21 NOTE — Telephone Encounter (Signed)
Called and spoke with patient.   She reports she is feeling better today. She reports she is planning on taking her kids to the zoo this afternoon.   She reports she feels more down at night time. She is unsure why this is. She reports fatigue and depressive thoughts.   She reports she has had feeling in the past of self harm, however denies any at this time. She denies any thoughts of hurting others. She reports "I would never hurt anyone."   Patient advised to schedule an apt ASAP. She reports she would prefer to see someone she has seen in the past. Melissa Noon has nothing until the end of May. Patient scheduled with Idalia Needle next week.   Information for walk in behavorial health clinic given. She reports she is familiar.   Will forward to PCP.

## 2022-11-28 ENCOUNTER — Other Ambulatory Visit: Payer: Self-pay | Admitting: Family Medicine

## 2022-11-28 ENCOUNTER — Ambulatory Visit: Payer: Medicaid Other | Admitting: Family Medicine

## 2022-11-28 DIAGNOSIS — R002 Palpitations: Secondary | ICD-10-CM

## 2022-11-29 ENCOUNTER — Ambulatory Visit: Payer: Medicaid Other

## 2022-12-05 ENCOUNTER — Ambulatory Visit (INDEPENDENT_AMBULATORY_CARE_PROVIDER_SITE_OTHER): Payer: Medicaid Other | Admitting: Family Medicine

## 2022-12-05 ENCOUNTER — Encounter: Payer: Self-pay | Admitting: Family Medicine

## 2022-12-05 VITALS — BP 124/74 | HR 83 | Ht 68.0 in | Wt 222.0 lb

## 2022-12-05 DIAGNOSIS — F53 Postpartum depression: Secondary | ICD-10-CM

## 2022-12-05 DIAGNOSIS — F333 Major depressive disorder, recurrent, severe with psychotic symptoms: Secondary | ICD-10-CM

## 2022-12-05 MED ORDER — SERTRALINE HCL 100 MG PO TABS: 200.0000 mg | ORAL_TABLET | Freq: Every day | ORAL | 1 refills | Status: DC

## 2022-12-05 NOTE — Patient Instructions (Signed)
It was great to see you! Thank you for allowing me to participate in your care!  I recommend that you always bring your medications to each appointment as this makes it easy to ensure we are on the correct medications and helps [place] not miss when refills are needed.  Our plans for today:  - Please follow the safety plan we created. - I have provided therapy resources below.  Please arrive 15 minutes PRIOR to your next scheduled appointment time! If you do not, this affects OTHER patients' care.  Take care and seek immediate care sooner if you develop any concerns.   Dr. Celine Mans, MD [facility]      Therapy and Counseling Resources Most providers on this list will take Medicaid. Patients with commercial insurance or Medicare should contact their insurance company to get a list of in network providers.  [employer] (takes children) Location 1: [address] [place], [place] [address] Location 2: [address] [place], [place] [address] [phone]   [facility] (spanish speaking therapist available)(habla espanol)(take medicare and medicaid)  [address], [place], [place] [address], [email or web address] [email or web address] [phone]  [facility]:Psychiatry and Counseling [address] [facility] [place] [place], [place] [address] [phone]  [facility]   [address], [address] [place], [place] [address]      [phone]  [facility] (spanish available) [address]  [place], [place] [address] [phone]  [facility] (take [facility] and medicare) [address]  [place], [place] [address]       [phone]     [facility] (virtual only) [phone]  [facility]uss[facility] [address], [place], [place] [phone]  [facility]:  [address] [place] [phone]  [facility]:  [address] [place]  [phone]  [facility] (under & uninsured) [address]   [place] [phone]    [email or web address]    [facility] [address]  [place]    [phone]  [facility] -[address]     Phone:  [phone]     [facility]-  [address] [place]  [phone]   [facility] [address]      [place], [place] [phone]  [facility]: [address] [place], [place], [place]  [phone]   [facility] (Spanish therapist) [email or web address]  [address] [place]  [address]   [place] [address]    [phone]    [facility]   [address],  [place], [place]  [phone]   [facility]  [address] [place]  [phone]  [facility]  [address] [place], [place]        [phone]  Open Access/Walk In Clinic under & uninsured  [facility]  [address] [place], [place] [phone] Crisis [phone]  [facility] [place],  (Spanish)   [address] [place], [place]: [phone]) 8:30 - 12; 1 - 2:30  [facility] [employer],  [address] [place], [place]    [phone]):8:30 - 12; 2 - 3PM  [facility] [employer],  [address]  [place]; [phone]):   Mon - Fri 8 AM - 5 PM  [facility] [address]  [place]  MWF 12:30 to 3:00 or call to schedule an appointment  [phone]  Specific Provider options Psychology Today  [email or web address]://www.psychologytoday.com/us click on find a therapist  enter your zip code left side and select or tailor a therapist for your specific need.   [facility] Provider Directory [email or web address]  (Medicaid)   Follow all drop down to find a provider  Social Support program Mental Health  [place] [phone] or PhotoSolver.pl [address] [place], [place] Recovery support and educational   24- Hour Availability:   [facility] [place]  [address] [place] , [place] [phone] Crisis [phone]  [facility] [phone]  [place] [facility]  [phone]   [facility]  [phone] (after hours)  Therapeutic Alternative/Mobile Crisis   [phone]  [place] National Suicide Hotline  [phone] Len Childs)  Call 911 or go to emergency room  [facility]  [phone]);  [place] and [employer]  [phone]); [place], [place], [place], [place], [place], [place], [place]

## 2022-12-05 NOTE — Progress Notes (Signed)
    SUBJECTIVE:   CHIEF COMPLAINT / HPI: more depressed.  Stopped on bupropion due to bulimia. Been having bad thoughts since. Thoughts of harming self. Has 2 little girls that she takes care of, motivated for them. No current plans. Feels Zoloft has not helped.   Attempted Excedrin overdose in the past x4. Most recent January 2019.   On Zoloft. Stopped bupropion recently and had bad withdrawal symptoms.  PERTINENT  PMH / PSH: Migraine, GAD, PTSD, MDD  OBJECTIVE:   BP 124/74   Pulse 83   Ht 5\' 8"  (1.727 m)   Wt 222 lb (100.7 kg)   LMP 11/03/2022   SpO2 96%   BMI 33.75 kg/m   General: NAD, well appearing Cardiovascular: RRR, no murmurs, no peripheral edema Respiratory: normal WOB on RA Extremities: Moving all 4 extremities equally   ASSESSMENT/PLAN:   Severe recurrent major depression with psychotic features [facility]) Assessment & Plan: Suicide Assessment  Plan: - How specific is the plan: does not have specific plan - How lethal are the means: n/a - Does the patient have access to the means: n/s - Does the patient have social support: yes (fiance, best friend)  Protective factors (what has kept the patient from self-harm thus far):  dedicated to taking care of her two daughters, feels comfortable talking to fiance about her thoughts  History of SI / Attempts: x4 overdose  Duration and Intensity of SI:  3 weeks, moderate  History of prior psychiatric hospitalizations: yes  Chart review for additional risk factors (cite chronic pain, insomnia, panic attacks, age, gender, if present): PTSD, panic attacks   Patient does not have active plan, and has strong protective factors in taking care of her two daughters, despite strong risk factors. I do not believe patient to be danger to self at this time. Thoughts have returned since stopping bupropion 3 weeks ago due to active bulimia. Discussed safety plan when she has thoughts of self harm, first talking to fiance, and then  calling her best friend. Patient agreeable to this plan. Additionally, increase Sertraline to 200mg  daily. Close follow-up 3 weeks. Provided medicaid counseling resources.   Orders: -     Sertraline HCl; Take 2 tablets (200 mg total) by mouth daily.  Dispense: 90 tablet; Refill: 1   Return in about 3 weeks (around 12/26/2022) for Depression f/u.  Celine Mans, MD [facility]

## 2022-12-06 NOTE — Assessment & Plan Note (Signed)
Suicide Assessment  Plan: - How specific is the plan: does not have specific plan - How lethal are the means: n/a - Does the patient have access to the means: n/s - Does the patient have social support: yes (fiance, best friend)  Protective factors (what has kept the patient from self-harm thus far):  dedicated to taking care of her two daughters, feels comfortable talking to fiance about her thoughts  History of SI / Attempts: x4 overdose  Duration and Intensity of SI:  3 weeks, moderate  History of prior psychiatric hospitalizations: yes  Chart review for additional risk factors (cite chronic pain, insomnia, panic attacks, age, gender, if present): PTSD, panic attacks   Patient does not have active plan, and has strong protective factors in taking care of her two daughters, despite strong risk factors. I do not believe patient to be danger to self at this time. Thoughts have returned since stopping bupropion 3 weeks ago due to active bulimia. Discussed safety plan when she has thoughts of self harm, first talking to fiance, and then calling her best friend. Patient agreeable to this plan. Additionally, increase Sertraline to 200mg  daily. Close follow-up 3 weeks. Provided medicaid counseling resources.

## 2022-12-07 ENCOUNTER — Encounter: Payer: Self-pay | Admitting: Student

## 2022-12-09 ENCOUNTER — Other Ambulatory Visit: Payer: Self-pay | Admitting: Student

## 2022-12-09 DIAGNOSIS — N946 Dysmenorrhea, unspecified: Secondary | ICD-10-CM

## 2022-12-09 NOTE — Progress Notes (Signed)
Referral to OB/GYN placed 

## 2022-12-13 ENCOUNTER — Ambulatory Visit: Payer: Medicaid Other | Admitting: Student

## 2022-12-13 VITALS — BP 132/91 | HR 76 | Ht 68.0 in | Wt 220.4 lb

## 2022-12-13 DIAGNOSIS — J069 Acute upper respiratory infection, unspecified: Secondary | ICD-10-CM | POA: Diagnosis present

## 2022-12-13 DIAGNOSIS — R0981 Nasal congestion: Secondary | ICD-10-CM | POA: Diagnosis not present

## 2022-12-13 MED ORDER — OXYMETAZOLINE HCL 0.05 % NA SOLN
2.0000 | Freq: Two times a day (BID) | NASAL | 0 refills | Status: AC
Start: 2022-12-13 — End: 2022-12-16

## 2022-12-13 NOTE — Progress Notes (Signed)
    SUBJECTIVE:   CHIEF COMPLAINT / HPI: Sick sxs  Sore throat-started on Monday Cough, congestion for the last 2 days No fevers Some diarrhea No vomiting One daughter sick as well Took cough and chest congestion medicine helped today Wanting to make sure there is nothing else going on  PERTINENT  PMH / PSH: reviewed  OBJECTIVE:   BP (!) 132/91   Pulse 76   Ht 5\' 8"  (1.727 m)   Wt 220 lb 6 oz (100 kg)   LMP 12/07/2022   SpO2 98%   BMI 33.51 kg/m   General: Well appearing, NAD, awake, alert, responsive to questions Head: Normocephalic atraumatic, nasal turbinates swollen bilaterally, oropharynx with erythema but no exudates, no tender cervical lymphadenopathy CV: Regular rate and rhythm no murmurs rubs or gallops Respiratory: Clear to ausculation bilaterally, no wheezes rales or crackles, chest rises symmetrically,  no increased work of breathing Extremities: Moves upper and lower extremities freely  ASSESSMENT/PLAN:   Viral URI with cough Given cough, diarrhea and nasal congestion with sick contact believe this is most likely related to a virus that is passing through her.  Discussed that cough can be ongoing for a very long time.  I will provide a Afrin nasal spray for her to use for 3 days to help with the nasal congestion.  After that I recommended Flonase that she has at home. - oxymetazoline (AFRIN NASAL SPRAY) 0.05 % nasal spray; Place 2 sprays into both nostrils 2 (two) times daily for 3 days.  Dispense: 6 mL; Refill: 0 - Symptomatic measures discussed with patient  Levin Erp, MD [facility]

## 2022-12-13 NOTE — Patient Instructions (Signed)
It was great to see you! Thank you for allowing me to participate in your care!   Our plans for today:  - I am going to send in afrin to do twice a day for 3 days- do not use longer than 3 days! Use your flonase after that - Drink lots of fluids, use humidifier and warm fluids with honey at night time  Take care and seek immediate care sooner if you develop any concerns.  Levin Erp, MD

## 2023-01-03 ENCOUNTER — Encounter (HOSPITAL_COMMUNITY): Payer: Self-pay | Admitting: Emergency Medicine

## 2023-01-03 ENCOUNTER — Emergency Department (HOSPITAL_COMMUNITY): Payer: Medicaid Other

## 2023-01-03 ENCOUNTER — Other Ambulatory Visit: Payer: Self-pay

## 2023-01-03 ENCOUNTER — Emergency Department (HOSPITAL_COMMUNITY)
Admission: EM | Admit: 2023-01-03 | Discharge: 2023-01-03 | Disposition: A | Discharge status: Home / Self Care | Payer: Medicaid Other | Attending: Emergency Medicine | Admitting: Emergency Medicine

## 2023-01-03 DIAGNOSIS — R110 Nausea: Secondary | ICD-10-CM | POA: Diagnosis present

## 2023-01-03 DIAGNOSIS — R109 Unspecified abdominal pain: Secondary | ICD-10-CM | POA: Diagnosis not present

## 2023-01-03 DIAGNOSIS — Z20822 Contact with and (suspected) exposure to covid-19: Secondary | ICD-10-CM | POA: Diagnosis not present

## 2023-01-03 DIAGNOSIS — R531 Weakness: Secondary | ICD-10-CM | POA: Diagnosis not present

## 2023-01-03 DIAGNOSIS — G43909 Migraine, unspecified, not intractable, without status migrainosus: Secondary | ICD-10-CM | POA: Diagnosis not present

## 2023-01-03 DIAGNOSIS — R000 Tachycardia, unspecified: Secondary | ICD-10-CM | POA: Diagnosis not present

## 2023-01-03 DIAGNOSIS — R509 Fever, unspecified: Secondary | ICD-10-CM | POA: Diagnosis not present

## 2023-01-03 DIAGNOSIS — R7401 Elevation of levels of liver transaminase levels: Secondary | ICD-10-CM | POA: Insufficient documentation

## 2023-01-03 DIAGNOSIS — R7989 Other specified abnormal findings of blood chemistry: Secondary | ICD-10-CM

## 2023-01-03 LAB — COMPREHENSIVE METABOLIC PANEL
ALT: 55 U/L — ABNORMAL HIGH (ref 0–44)
AST: 71 U/L — ABNORMAL HIGH (ref 15–41)
Albumin: 3.5 g/dL (ref 3.5–5.0)
Alkaline Phosphatase: 113 U/L (ref 38–126)
Anion gap: 9 (ref 5–15)
BUN: 10 mg/dL (ref 6–20)
CO2: 22 mmol/L (ref 22–32)
Calcium: 8.7 mg/dL — ABNORMAL LOW (ref 8.9–10.3)
Chloride: 105 mmol/L (ref 98–111)
Creatinine, Ser: 0.78 mg/dL (ref 0.44–1.00)
GFR, Estimated: >60 mL/min (ref >60)
Glucose, Bld: 120 mg/dL — ABNORMAL HIGH (ref 70–99)
Potassium: 3.6 mmol/L (ref 3.5–5.1)
Sodium: 136 mmol/L (ref 135–145)
Total Bilirubin: 0.3 mg/dL (ref 0.3–1.2)
Total Protein: 7.1 g/dL (ref 6.5–8.1)

## 2023-01-03 LAB — CBC WITH DIFFERENTIAL/PLATELET
Abs Immature Granulocytes: 0.02 10*3/uL (ref 0.00–0.07)
Basophils Absolute: 0 10*3/uL (ref 0.0–0.1)
Basophils Relative: 0 %
Eosinophils Absolute: 0 10*3/uL (ref 0.0–0.5)
Eosinophils Relative: 1 %
HCT: 39.2 % (ref 36.0–46.0)
Hemoglobin: 12.9 g/dL (ref 12.0–15.0)
Immature Granulocytes: 0 %
Lymphocytes Relative: 10 %
Lymphs Abs: 0.7 10*3/uL (ref 0.7–4.0)
MCH: 27.6 pg (ref 26.0–34.0)
MCHC: 32.9 g/dL (ref 30.0–36.0)
MCV: 83.9 fL (ref 80.0–100.0)
Monocytes Absolute: 0.4 10*3/uL (ref 0.1–1.0)
Monocytes Relative: 6 %
Neutro Abs: 5.7 10*3/uL (ref 1.7–7.7)
Neutrophils Relative %: 83 %
Platelets: 236 10*3/uL (ref 150–400)
RBC: 4.67 MIL/uL (ref 3.87–5.11)
RDW: 14.3 % (ref 11.5–15.5)
WBC: 6.9 10*3/uL (ref 4.0–10.5)
nRBC: 0 % (ref 0.0–0.2)

## 2023-01-03 LAB — URINALYSIS, ROUTINE W REFLEX MICROSCOPIC
Bilirubin Urine: NEGATIVE
Glucose, UA: NEGATIVE mg/dL
Hgb urine dipstick: NEGATIVE
Ketones, ur: NEGATIVE mg/dL
Leukocytes,Ua: NEGATIVE
Nitrite: NEGATIVE
Protein, ur: NEGATIVE mg/dL
Specific Gravity, Urine: 1.02 (ref 1.005–1.030)
pH: 5.5 (ref 5.0–8.0)

## 2023-01-03 LAB — SARS CORONAVIRUS 2 BY RT PCR: SARS Coronavirus 2 by RT PCR: NEGATIVE

## 2023-01-03 LAB — I-STAT BETA HCG BLOOD, ED (MC, WL, AP ONLY): I-stat hCG, quantitative: <5 m[IU]/mL (ref <5)

## 2023-01-03 MED ORDER — DIPHENHYDRAMINE HCL 50 MG/ML IJ SOLN
25.0000 mg | Freq: Once | INTRAMUSCULAR | Status: AC
  Administered 2023-01-03: 25 mg via INTRAVENOUS
  Filled 2023-01-03: qty 1

## 2023-01-03 MED ORDER — ACETAMINOPHEN 325 MG PO TABS
650.0000 mg | ORAL_TABLET | Freq: Once | ORAL | Status: AC
  Administered 2023-01-03: 650 mg via ORAL
  Filled 2023-01-03: qty 2

## 2023-01-03 MED ORDER — METOCLOPRAMIDE HCL 5 MG/ML IJ SOLN
10.0000 mg | Freq: Once | INTRAMUSCULAR | Status: AC
  Administered 2023-01-03: 10 mg via INTRAVENOUS
  Filled 2023-01-03: qty 2

## 2023-01-03 MED ORDER — METOCLOPRAMIDE HCL 10 MG PO TABS: 10.0000 mg | ORAL_TABLET | Freq: Three times a day (TID) | ORAL | 0 refills | Status: DC | PRN

## 2023-01-03 MED ORDER — SODIUM CHLORIDE 0.9 % IV BOLUS
1000.0000 mL | Freq: Once | INTRAVENOUS | Status: AC
  Administered 2023-01-03: 1000 mL via INTRAVENOUS

## 2023-01-03 NOTE — ED Triage Notes (Signed)
Patient c/o fever since yesterday along with generalized weakness.

## 2023-01-03 NOTE — Discharge Instructions (Addendum)
You may take ibuprofen 400 mg every 6 hours as needed for your migraine.  Make sure to drink plenty of fluids.  You have been prescribed Reglan for your nausea. You may take this as needed for the next 3 days.   Please follow-up with your primary care provider within the next week to ensure symptoms are improving and to repeat labs.  Return to the ER should your migraine become very severe or has not resolved within the next 48 hours, you have severe abdominal pain, you are unable to move your neck, you develop dizziness, confusion, shortness of breath, chest pain, or any other symptoms that concern you.

## 2023-01-03 NOTE — ED Provider Triage Note (Signed)
Emergency Medicine Provider Triage Evaluation Note  Stephanie Frazier , a 22 y.o. female  was evaluated in triage.  Pt complains of fever, SOB, suprapubic pain, headache, and body achesx1 day. Highest fever of 101. Hx of migraines  Review of Systems  Positive: fever Negative: N/V/D  Physical Exam  BP (!) 165/92 (BP Location: Right Arm)   Pulse 96   Temp 99 F (37.2 C) (Oral)   Resp 18   Ht 5\' 8"  (1.727 m)   Wt 100 kg   LMP 12/07/2022   SpO2 96%   BMI 33.52 kg/m  Gen:   Awake, no distress   Resp:  Normal effort  MSK:   Moves extremities without difficulty Other:  No neurodeficits  Medical Decision Making  Medically screening exam initiated at 6:17 AM.  Appropriate orders placed.  RECIE CARNELL was informed that the remainder of the evaluation will be completed by another provider, this initial triage assessment does not replace that evaluation, and the importance of remaining in the ED until their evaluation is complete.    Pete Pelt, [place] 01/03/23 [phone]

## 2023-01-03 NOTE — ED Provider Notes (Signed)
[place] EMERGENCY DEPARTMENT AT [facility] Provider Note   CSN: [id number] Arrival date & time: 01/03/23  1914     History  Chief Complaint  Patient presents with   Fever    Stephanie Frazier is a 22 y.o. female presents with onset of frontal migraine, nausea, weakness, fever, chills, abdominal pain for 1 day.  Her symptoms started with a frontal migraine yesterday morning, and this is a typical migraine for her.  This was accompanied by photosensitivity and nausea.  She denies any head trauma. She then started to develop weakness, abdominal pain, fever, chills.  Abdominal pain is suprapubic and intermittent. She also states she has chest pressure that is making it "hard to stay still right now". Denies chest pain, shortness of breath, jaw or shoulder pain, palpatations. Denies any sick contacts, cough, shortness of breath, emesis, changes to bowel or bladder habits, hematuria, rashes.  Denies any concern for STIs, changes in vaginal discharge.  No known tick bites, she has not recently been in the woods.  She is taking ibuprofen and a "cold and flu medication" at home which has not provided relief.  Her only abdominal surgery has been a tubal ligation.  She still has her appendix and gallbladder.  Fever Associated symptoms: chills and nausea   Associated symptoms: no diarrhea        Home Medications Prior to Admission medications   Medication Sig Start Date End Date Taking? Authorizing Provider  amoxicillin-clavulanate (AUGMENTIN) 875-125 MG tablet Take 1 tablet by mouth 2 (two) times daily. 10/23/22   Westley Chandler, MD  fluticasone (FLONASE) 50 MCG/ACT nasal spray Place 2 sprays into both nostrils daily. 10/23/22   Westley Chandler, MD  metoprolol succinate (TOPROL-XL) 25 MG 24 hr tablet TAKE 1 TABLET BY MOUTH EVERYDAY AT BEDTIME 11/29/22   Dameron, Nolberto Hanlon, DO  sertraline (ZOLOFT) 100 MG tablet Take 2 tablets (200 mg total) by mouth daily. 12/05/22   Celine Mans, MD       Allergies    Morphine and codeine, Ondansetron hcl, Nifedipine er, and Ketorolac    Review of Systems   Review of Systems  Constitutional:  Positive for chills and fever.  Gastrointestinal:  Positive for abdominal pain and nausea. Negative for constipation and diarrhea.  Genitourinary:  Negative for flank pain.    Physical Exam Updated Vital Signs BP (!) 165/92 (BP Location: Right Arm)   Pulse 96   Temp 99 F (37.2 C) (Oral)   Resp 18   Ht 5\' 8"  (1.727 m)   Wt 100 kg   LMP 12/07/2022   SpO2 96%   BMI 33.52 kg/m  Physical Exam Vitals and nursing note reviewed.  Constitutional:      General: She is not in acute distress.    Comments: Tired appearing, slow soft speech  HENT:     Head: Normocephalic and atraumatic.     Mouth/Throat:     Mouth: Mucous membranes are dry.  Eyes:     Extraocular Movements: Extraocular movements intact.     Pupils: Pupils are equal, round, and reactive to light.  Cardiovascular:     Rate and Rhythm: Regular rhythm. Tachycardia present.     Pulses: Normal pulses.     Heart sounds: Normal heart sounds.  Pulmonary:     Effort: Pulmonary effort is normal.     Breath sounds: Normal breath sounds.  Abdominal:     General: Abdomen is flat. Bowel sounds are normal.  Palpations: Abdomen is soft.     Comments: Mild tenderness to palpation diffusely  Musculoskeletal:     Cervical back: Normal range of motion. No rigidity.  Skin:    Findings: No rash.  Neurological:     General: No focal deficit present.     Cranial Nerves: No cranial nerve deficit.     ED Results / Procedures / Treatments   Labs (all labs ordered are listed, but only abnormal results are displayed) Labs Reviewed  COMPREHENSIVE METABOLIC PANEL - Abnormal; Notable for the following components:      Result Value   Glucose, Bld 120 (*)    Calcium 8.7 (*)    AST 71 (*)    ALT 55 (*)    All other components within normal limits  SARS CORONAVIRUS 2 BY RT PCR  CBC WITH  DIFFERENTIAL/PLATELET  URINALYSIS, ROUTINE W REFLEX MICROSCOPIC  I-STAT BETA HCG BLOOD, ED ([facility], [facility], [facility] ONLY)    EKG EKG Interpretation  Date/Time:  Friday January 03 2023 07:19:04 EDT Ventricular Rate:  84 PR Interval:  159 QRS Duration: 108 QT Interval:  369 QTC Calculation: 437 R Axis:   131 Text Interpretation: Right and left arm electrode reversal, interpretation assumes no reversal Sinus rhythm Right axis deviation Confirmed by Margarita Grizzle [phone]) on 01/03/2023 7:22:41 AM  Radiology DG Chest 2 View  Result Date: 01/03/2023 CLINICAL DATA:  22 year old female with history of shortness of breath. EXAM: CHEST - 2 VIEW COMPARISON:  Chest x-ray 07/07/2011. FINDINGS: Lung volumes are low. Mild interstitial prominence in peribronchial cuffing noted in the lungs, which could suggest very mild acute bronchitis. No confluent consolidative airspace disease. No pleural effusions. No pneumothorax. No evidence of pulmonary edema. Heart size is normal. The patient is rotated to the left on today's exam, resulting in distortion of the mediastinal contours and reduced diagnostic sensitivity and specificity for mediastinal pathology. IMPRESSION: 1. Possible mild acute bronchitis, as above. Electronically Signed   By: Trudie Reed M.D.   On: 01/03/2023 06:59    Procedures Procedures    Medications Ordered in ED Medications  metoCLOPramide (REGLAN) injection 10 mg (10 mg Intravenous Given 01/03/23 0627)  diphenhydrAMINE (BENADRYL) injection 25 mg (25 mg Intravenous Given 01/03/23 0630)  sodium chloride 0.9 % bolus 1,000 mL (1,000 mLs Intravenous Bolus 01/03/23 0625)  acetaminophen (TYLENOL) tablet 650 mg (650 mg Oral Given 01/03/23 0630)    ED Course/ Medical Decision Making/ A&P                             Medical Decision Making Amount and/or Complexity of Data Reviewed Labs: ordered.   22 y.o. female presents to the ED for concern of migraine, fever, weakness for 1 day  Differential  diagnosis includes but is not limited to URI, covid, pneumonia, migraine, meningitis, pelvic inflammatory disease, arrhythmia, lyme disease.  COVID test negative here today.  Chest x-ray with no consolidations, patient having no cough or shortness of breath, less concern from a pneumonia.  Patient has history of migraine and her migraine today is consistent with previous.  Patient denies any concern for STIs or change in vaginal discharge, less concern for pelvic inflammatory disease.  Patient states she has some pressure on her chest but is not having any chest pain, difficulty breathing, shortness of breath, palpitations.  EKG with normal sinus rhythm, no concern for arrhythmia at this time.  Do not suspect meningitis as her headache is typical when  she has no neck stiffness.  No concern for Lyme disease at this time as she does not have any known tick exposure and does not have any rash.  ED Course:  Patient with elevated blood pressure today at 165/92, pulse rate 96, temp 99, respiratory rate 18 on arrival.  No leukocytosis.  There is a mild bump of AST and ALT today may be secondary to the medication she has been taking at home and Tylenol received here today.  Chest x-ray largely unremarkable- there was small concern for acute bronchitis however she is not having any cough, shortness of breath. COVID-negative. Urinalysis unremarkable  Patient received a normal saline bolus, Tylenol for her headache and fever, Reglan for nausea, Benadryl for migraine.   Upon re-evaluation, patient reports her headache has improved from when she first arrived to the ED and is now at a 4 out of 10. No concerning findings on labs or physical exam to warrant admission or further treatment in the ER. The patient was discharged home with instructions to follow-up with her PCP within the next week to ensure symptoms are improving and repeat labs.  Return precautions given   Impression: Elevated LFTs Migraine    Lab  Tests: I Ordered, and personally interpreted labs.  The pertinent results include:   CMP with elevated AST and ALT. Bilirubin and alkaline phosphatase within normal limits.CBC within normal limits Urinalysis within normal limits  I stat hcg negative COVID-negative  Imaging Studies ordered: I ordered imaging studies including chest x-ray I independently visualized and interpreted imaging which showed no consolidations concerning for pneumonia.  Possible mild acute bronchitis I agree with the radiologist interpretation   Cardiac Monitoring: / EKG: The patient was maintained on a cardiac monitor.  I personally viewed and interpreted the cardiac monitored which showed an underlying rhythm of: NSR   Consultations Obtained: None  External records from outside source obtained and reviewed including visit with her family medicine doctor on 12/13/2022 where she was seen for viral URI.   Co morbidities that complicate the patient evaluation  Depression with psychotic features, bulmia   Social Determinants of Health:  Depression            Final Clinical Impression(s) / ED Diagnoses Final diagnoses:  None    Rx / DC Orders ED Discharge Orders     None         Arabella Merles, PA-C 01/03/23 1010    Margarita Grizzle, MD 01/03/23 [phone]

## 2023-01-06 ENCOUNTER — Ambulatory Visit: Payer: Self-pay | Admitting: Student

## 2023-01-09 ENCOUNTER — Ambulatory Visit: Payer: Medicaid Other | Admitting: Physician Assistant

## 2023-01-09 NOTE — Progress Notes (Deleted)
Cardiology Office Note:  .   Date:  01/09/2023  ID:  [name], DOB 2000/08/28, MRN [id number] PCP: Darral Dash, DO  [facility] Providers Cardiologist:  None { Click to update primary MD,subspecialty MD or APP then REFRESH:1}   History of Present Illness: .   SIFA Stephanie Frazier is a 22 y.o. female (G2 P2-0-0-2) who presented to the clinic for follow-up for palpitations.  Patient was seen by Dr. [name] 07/08/2022.  Patient history includes anxiety, depression, and gestational hypertension.  Initial visit was 11/2021 where the patient was seen for palpitations and had been going on for years.  ZIO monitor 11/2021 showed normal sinus rhythm, 1 5 beat run of NSVT, rare SVE (less than 1%), rare VE (less than 1%).  No arrhythmias.  TTE 12/2021 with LVEF 66 5%, GLS -21.6%, normal RV, mild LAE, no significant valve disease.  Was seen in the clinic 01/2022 where she was doing well.  Palpitations had improved.  She was last seen Dr. [name] December 2023 and her palpitations had worsened a little bit lately.  Noticed a "sluggish" feeling of fluttering feeling.  She been struggling with mental health (depression/anxiety) and had a lot of troubles at home.  Only eating 1-2 times a day.  Denies any shortness of breath or dizziness.  She was followed very closely with behavioral health.  She was going to twice a week therapy and taking Zoloft 100 mg daily.  Did not have a PCP yet.  Today, she***  ROS: ***  Studies Reviewed: Marland Kitchen        Patch Wear Time:  6 days and 21 hours (2023-05-15T16:04:24-0400 to 2023-05-22T14:04:09-398)   Patient had a min HR of 60 bpm, max HR of 200 bpm, and avg HR of 96 bpm. Predominant underlying rhythm was Sinus Rhythm. Slight P wave morphology changes were noted. 1 run of Ventricular Tachycardia occurred lasting 5 beats with a max rate of 200 bpm  (avg 158 bpm). Isolated SVEs were rare (<1.0%), and no SVE Couplets or SVE Triplets were present. Isolated VEs  were rare (<1.0%), VE Couplets were rare (<1.0%), and no VE Triplets were present.   TTE 12/2021: IMPRESSIONS     1. Left ventricular ejection fraction, by estimation, is 60 to 65%. Left  ventricular ejection fraction by 3D volume is 64 %. The left ventricle has  normal function. The left ventricle has no regional wall motion  abnormalities. Left ventricular diastolic   parameters were normal. The average left ventricular global longitudinal  strain is 21.6 %. The global longitudinal strain is normal.   2. Right ventricular systolic function is normal. The right ventricular  size is mildly enlarged. There is normal pulmonary artery systolic  pressure.   3. Left atrial size was mildly dilated.   4. The mitral valve is normal in structure. Trivial mitral valve  regurgitation. No evidence of mitral stenosis.   5. The aortic valve is normal in structure. Aortic valve regurgitation is  trivial. No aortic stenosis is present.   6. The inferior vena cava is normal in size with greater than 50%  respiratory variability, suggesting right atrial pressure of 3 mmHg.    Risk Assessment/Calculations:   {Does this patient have ATRIAL FIBRILLATION?[phone]} No BP recorded.  {Refresh Note OR Click here to enter BP  :1}***       Physical Exam:   VS:  LMP 12/07/2022    Wt Readings from Last 3 Encounters:  01/03/23 220 lb 7.4 oz (100 kg)  12/13/22 220 lb 6 oz (100 kg)  12/05/22 222 lb (100.7 kg)    GEN: Well nourished, well developed in no acute distress NECK: No JVD; No carotid bruits CARDIAC: ***RRR, no murmurs, rubs, gallops RESPIRATORY:  Clear to auscultation without rales, wheezing or rhonchi  ABDOMEN: Soft, non-tender, non-distended EXTREMITIES:  No edema; No deformity   ASSESSMENT AND PLAN: .   1.  Palpitations 2.  Murmur 3.  Depression/anxiety/history of PTSD/eating disorder    {Are you ordering a CV Procedure (e.g. stress test, cath, DCCV, TEE, etc)?   Press F2         :[id number]}  Dispo: ***  Signed, Sharlene Dory, PA-C

## 2023-01-12 ENCOUNTER — Other Ambulatory Visit: Payer: Self-pay | Admitting: Family Medicine

## 2023-01-12 DIAGNOSIS — F333 Major depressive disorder, recurrent, severe with psychotic symptoms: Secondary | ICD-10-CM

## 2023-01-13 ENCOUNTER — Ambulatory Visit: Payer: Medicaid Other | Admitting: Student

## 2023-02-06 ENCOUNTER — Encounter: Payer: Self-pay | Admitting: Student

## 2023-02-06 ENCOUNTER — Ambulatory Visit (INDEPENDENT_AMBULATORY_CARE_PROVIDER_SITE_OTHER): Payer: MEDICAID | Admitting: Student

## 2023-02-06 VITALS — BP 136/98 | HR 82 | Ht 68.0 in | Wt 223.4 lb

## 2023-02-06 DIAGNOSIS — F333 Major depressive disorder, recurrent, severe with psychotic symptoms: Secondary | ICD-10-CM

## 2023-02-06 DIAGNOSIS — M2550 Pain in unspecified joint: Secondary | ICD-10-CM

## 2023-02-06 MED ORDER — SERTRALINE HCL 100 MG PO TABS
200.0000 mg | ORAL_TABLET | Freq: Every day | ORAL | 1 refills | Status: AC
Start: 2023-02-06 — End: ?

## 2023-02-06 NOTE — Progress Notes (Signed)
    SUBJECTIVE:   CHIEF COMPLAINT / HPI:   New house Zoloft 200 mg, sleeping okay Food relationship getting better (eating a bit more) Would not eat much initially  Air fryer    PERTINENT  PMH / PSH: ***  OBJECTIVE:   There were no vitals taken for this visit. ***   ASSESSMENT/PLAN:   No problem-specific Assessment & Plan notes found for this encounter.     Darral Dash, DO [facility]    {    This will disappear when note is signed, click to select method of visit    :1}

## 2023-02-06 NOTE — Patient Instructions (Addendum)
It was great seeing you today.  As we discussed, -Continue taking your Zoloft 200 mg daily.  I am so glad that you are feeling a bit better. -We are ordering blood work today to evaluate for possible rheumatoid arthritis.  I do believe your symptoms are probably carpal tunnel.  If your blood work comes back negative, we will refer you to sports medicine for possible steroid injections to help with your carpal tunnel.  In the meantime, you can wear a nighttime wrist brace on both wrist that keeps it in a neutral position and relieves pressure on the nerve.  You can buy this on [employer], [facility] or [facility].   If you have any questions or concerns, please feel free to call the clinic.   Have a wonderful day,  Dr. Darral Dash [facility] [phone]

## 2023-02-07 LAB — ANA: Anti Nuclear Antibody (ANA): NEGATIVE

## 2023-02-07 LAB — RHEUMATOID FACTOR: Rheumatoid fact SerPl-aCnc: 14.7 IU/mL — ABNORMAL HIGH (ref <14.0)

## 2023-02-10 DIAGNOSIS — M2550 Pain in unspecified joint: Secondary | ICD-10-CM | POA: Insufficient documentation

## 2023-02-10 NOTE — Assessment & Plan Note (Signed)
Continue Zoloft 200 mg daily Follow-up in 1 to 3 months

## 2023-02-10 NOTE — Assessment & Plan Note (Signed)
Unclear etiology at this time, but she is quite active with her 2 young children but can be causing overuse injury. Also consider somatic symptoms secondary to depression. Reassuringly, RF and ANA not elevated to the point of concern for underlying autoimmune illness. Discussed stretching, use of NSAIDs and Tylenol as needed Return if worsening

## 2023-02-13 NOTE — Progress Notes (Deleted)
  Cardiology Office Note:  .   Date:  02/13/2023  ID:  Stephanie Frazier, DOB 2000/12/29, MRN [id number] PCP: Stephanie Dash, DO  [facility] Providers Cardiologist:  None { Click to update primary MD,subspecialty MD or APP then REFRESH:1}   Patient Profile: .      PMH: Palpitations Zio 11/2021 NSR, avg HR 96 bpm, SVT x 5 beats, SVE <1%, VE <1%, no arrhythmias or pauses Zio 07/26/22 NSR, avg HR 88 bpm, SVE < 1%, VE <1%, no arrhythmias or pauses, pt triggered event NSR History of echocardiogram 12/23/2021 LVEF 60-65%, no rwma, normal diastolic parameters, GLS 21.6%, RV mildly enlarged, mild LAE, trivial MR Gestational hypertension Anxiety Depression  Referred to cardiology and seen by Stephanie Frazier 11/30/2021 for palpitations ongoing for years.  ZIO monitor 11/2021 showed NSR, 1 5 beat run of NSVT, rare SVE, rare VE, no arrhythmias.  TTE 12/2021 revealed LVEF 60 to 65%, GLS -21.6%, normal RV, mild LAE, no significant valve disease.  Seen in follow-up 01/2022 and reported palpitations had improved.  Last cardiology clinic visit was 06/28/22 with Stephanie Frazier at which time she reported a "sluggish" feeling and fluttering feeling.  Reported struggling with mental health (depression and anxiety) and a lot of home troubles.  Following closely with behavioral health, in therapy twice weekly. Taking Zoloft 100 mg daily.  Admitted to only eating 1-2 times daily. ECG demonstrated sinus tachycardia at 103 bpm, no acute change from previous. Extensive lab work was ordered with no acute electrolyte abnormality, normal blood counts, mildly elevated alk phos and ALT.  Iron saturation was low at 13%. She was referred to Stephanie Frazier at [facility] for primary care.   She contacted our office 07/08/22 to report worsening palpitations.  3-day Zio monitor was repeated and revealed predominant NSR with average HR 88 bpm, rare SVE and VE, no sustained arrhythmias or significant pauses, patient  triggered event correlated with NSR.       History of Present Illness: .   Stephanie Frazier is a *** 22 y.o. female who is here today for palpitations.   ROS: ***       Studies Reviewed: .        *** Risk Assessment/Calculations:   {Does this patient have ATRIAL FIBRILLATION?[phone]} No BP recorded.  {Refresh Note OR Click here to enter BP  :1}***       Physical Exam:   VS:  LMP 01/07/2023 (Exact Date)    Wt Readings from Last 3 Encounters:  02/06/23 223 lb 6.4 oz (101.3 kg)  01/03/23 220 lb 7.4 oz (100 kg)  12/13/22 220 lb 6 oz (100 kg)    GEN: Well nourished, well developed in no acute distress NECK: No JVD; No carotid bruits CARDIAC: ***RRR, no murmurs, rubs, gallops RESPIRATORY:  Clear to auscultation without rales, wheezing or rhonchi  ABDOMEN: Soft, non-tender, non-distended EXTREMITIES:  No edema; No deformity     ASSESSMENT AND PLAN: .    Palpitations: Obesity:     {Are you ordering a CV Procedure (e.g. stress test, cath, DCCV, TEE, etc)?   Press F2        :[id number]}  Dispo: ***  Signed, Eligha Bridegroom, NP-C

## 2023-02-19 ENCOUNTER — Ambulatory Visit: Payer: MEDICAID | Admitting: Nurse Practitioner

## 2023-02-24 ENCOUNTER — Encounter: Payer: Self-pay | Admitting: Student

## 2023-03-18 ENCOUNTER — Ambulatory Visit: Payer: MEDICAID

## 2023-03-26 ENCOUNTER — Ambulatory Visit: Payer: MEDICAID | Admitting: Student

## 2023-03-26 ENCOUNTER — Ambulatory Visit (INDEPENDENT_AMBULATORY_CARE_PROVIDER_SITE_OTHER): Payer: MEDICAID | Admitting: Family Medicine

## 2023-03-26 ENCOUNTER — Encounter: Payer: Self-pay | Admitting: Family Medicine

## 2023-03-26 VITALS — BP 130/76 | HR 92 | Ht 68.0 in | Wt 234.2 lb

## 2023-03-26 DIAGNOSIS — R110 Nausea: Secondary | ICD-10-CM | POA: Diagnosis not present

## 2023-03-26 DIAGNOSIS — G43001 Migraine without aura, not intractable, with status migrainosus: Secondary | ICD-10-CM

## 2023-03-26 LAB — POCT URINE PREGNANCY: Preg Test, Ur: NEGATIVE

## 2023-03-26 LAB — POC SOFIA SARS ANTIGEN FIA: SARS Coronavirus 2 Ag: NEGATIVE

## 2023-03-26 MED ORDER — METOCLOPRAMIDE HCL 10 MG PO TABS: 10.0000 mg | ORAL_TABLET | Freq: Three times a day (TID) | ORAL | 0 refills | Status: AC | PRN

## 2023-03-26 MED ORDER — SUMATRIPTAN SUCCINATE 25 MG PO TABS: 25.0000 mg | ORAL_TABLET | ORAL | 0 refills | Status: AC | PRN

## 2023-03-26 NOTE — Patient Instructions (Addendum)
It was great to see you!  Our plans for today:  - Call OB/GYN for an appointment [address] [place], [place] [address] Phone: [phone] - Take the reglan for headache and nausea now. Take imitrex for future headaches within the first 15-20 minutes of headache coming on. If you notice your headaches are becoming more frequent, come back to see [place]. You may need daily medication to prevent headaches.   Take care and seek immediate care sooner if you develop any concerns.   Dr. Linwood Dibbles

## 2023-03-26 NOTE — Assessment & Plan Note (Addendum)
COVID negative, U preg negative as expected. Will provide reglan for abortive effect for current headache and nausea. Rx imitrex for future migraines. Discussed potential for preventive therapy, already on metoprolol, consider increase if headaches are becoming more frequent.

## 2023-03-26 NOTE — Progress Notes (Signed)
    SUBJECTIVE:   CHIEF COMPLAINT / HPI:   HEADACHE - seen previously 6/14 at [facility] for same - nausea, headache, fatigue daily since 8/7. Headaches are typical of usual migraines. Usually gets about once per month. More often recently due to stressful family situation.  - constellation of symptoms usually around the start of her previous pregnancies. Wants to be sure she is not pregnant. LMP 8/20. H/o BTL.  - fronto-temporal headache - typically with visual aura - +photo/phonophobia - has tried aspirin, tylenol, naproxen. Helped some. Small amount of pain currently 3/10. - notes symptoms are improving over the past few days - previously was on topamax for prevention years ago but didn't feel this was helpful. - denies sick contacts. Denies runny nose, cough, congestion, fevers   OBJECTIVE:   BP 130/76   Pulse 92   Ht 5\' 8"  (1.727 m)   Wt 234 lb 3.2 oz (106.2 kg)   LMP 03/11/2023   SpO2 97%   BMI 35.61 kg/m   Gen: well appearing, in NAD Card: RRR Lungs: CTAB Ext: WWP, no edema MSK: Full ROM, strength 5/5 to U/LE bilaterally.  No edema.  Neuro: Alert and oriented, speech normal. PERRL, Extraocular movements intact.  Intact symmetric sensation to light touch of face and extremities bilaterally.  Hearing grossly intact bilaterally.  Tongue protrudes normally with no deviation.  Shoulder shrug, smile symmetric.    ASSESSMENT/PLAN:   Migraine without aura and with status migrainosus, not intractable COVID negative, U preg negative as expected. Will provide reglan for abortive effect for current headache and nausea. Rx imitrex for future migraines. Discussed potential for preventive therapy, already on metoprolol, consider increase if headaches are becoming more frequent.      Caro Laroche, DO

## 2023-03-29 ENCOUNTER — Encounter: Payer: Self-pay | Admitting: Student

## 2023-04-21 ENCOUNTER — Ambulatory Visit: Payer: MEDICAID

## 2023-05-15 ENCOUNTER — Encounter: Payer: MEDICAID | Admitting: Family Medicine

## 2023-05-22 NOTE — Progress Notes (Deleted)
  Cardiology Office Note:  .   Date:  05/22/2023  ID:  Stephanie Frazier, DOB [birth year], MRN [id number] PCP: Darral Dash, DO  [facility] Providers Cardiologist:  None { Click to update primary MD,subspecialty MD or APP then REFRESH:1}   Patient Profile: .      PMH Palpitations Anxiety Depression Eating disorder Gestational hypertension Murmur TTE 12/23/2021 Normal LVEF 60-65%, no significant valve disease  G2P2002 female referred to cardiology and seen by Dr. [name] 11/30/2021 for palpitations.  She reported long history of palpitations that continued throughout pregnancy but did not worsen.  She reported history of hypertension during childhood that was never evaluated.  She had gestational HTN during first pregnancy.  ZIO monitor 11/2021 showed NSR, 1 5 beat run of NSVT, no significant arrhythmias.  TTE 12/2021 revealed normal LVEF, normal RV, mild LAE, and no significant valve disease.  Last cardiology clinic visit 06/28/2022 with Dr. [name] at which time she reported feeling "sluggish" and struggling with mental health including depression, anxiety, and lots of home troubles.  She reported only eating 1-2 times per day.  She was being followed closely by behavioral health.  Lab testing completed at that visit revealed normal phosphorus, magnesium, B12, TSH, ferritin and iron but iron saturation only 13%. Alkaline phosphatase elevated at 142 and ALT 39. She was referred to primary care.   Cardiac monitor completed 07/26/2022 revealed predominant rhythm NSR with average HR 88 bpm, rare SVE, VE, no sustained arrhythmias or significant pauses, patient triggered events correlated with NSR.       History of Present Illness: .   Stephanie Frazier is a *** 22 y.o. female who is here today for follow-up   Discussed the use of AI scribe software for clinical note transcription with the patient, who gave verbal consent to proceed.   ROS: ***       Studies Reviewed: .         *** Risk Assessment/Calculations:   {Does this patient have ATRIAL FIBRILLATION?:[phone]} No BP recorded.  {Refresh Note OR Click here to enter BP  :1}***       Physical Exam:   VS:  There were no vitals taken for this visit.   Wt Readings from Last 3 Encounters:  03/26/23 234 lb 3.2 oz (106.2 kg)  02/06/23 223 lb 6.4 oz (101.3 kg)  01/03/23 220 lb 7.4 oz (100 kg)    GEN: Well nourished, well developed in no acute distress NECK: No JVD; No carotid bruits CARDIAC: ***RRR, no murmurs, rubs, gallops RESPIRATORY:  Clear to auscultation without rales, wheezing or rhonchi  ABDOMEN: Soft, non-tender, non-distended EXTREMITIES:  No edema; No deformity     ASSESSMENT AND PLAN: .    Palpitations: Hypertension:     {Are you ordering a CV Procedure (e.g. stress test, cath, DCCV, TEE, etc)?   Press F2        :[id number]}  Dispo: ***  Signed, Eligha Bridegroom, NP-C

## 2023-05-26 ENCOUNTER — Ambulatory Visit: Payer: MEDICAID | Admitting: Nurse Practitioner

## 2023-06-04 ENCOUNTER — Ambulatory Visit: Payer: MEDICAID | Admitting: Family Medicine

## 2023-06-05 ENCOUNTER — Ambulatory Visit: Payer: MEDICAID

## 2023-06-05 VITALS — BP 140/82 | HR 91 | Ht 68.0 in | Wt 243.4 lb

## 2023-06-05 DIAGNOSIS — M79672 Pain in left foot: Secondary | ICD-10-CM | POA: Diagnosis not present

## 2023-06-05 NOTE — Progress Notes (Signed)
    SUBJECTIVE:   CHIEF COMPLAINT / HPI: Left ankle swelling  The patient presents with left ankle swelling, which has been ongoing for a week. She reports a recent incident where she slipped and twisted her ankle while chasing after her one-year-old child a week ago. She did not immediately walk on it, but sat down to calm down before attempting to walk. She reports stiffness in the ankle, causing a limp, but denies significant pain. She has noticed that the left ankle is more swollen than the right. She also reports a history of foot surgeries, with a visible scar on the left foot. She is currently on an antidepressant, and occasionally takes naproxen for ocular migraines.  PERTINENT  PMH / PSH: GAD, ADHD, migraines  OBJECTIVE:   BP (!) 140/82   Pulse 91   Ht 5\' 8"  (1.727 m)   Wt 243 lb 6.4 oz (110.4 kg)   LMP 04/22/2023   SpO2 99%   BMI 37.01 kg/m   General: Well appearing, NAD, awake, alert, responsive to questions Head: Normocephalic atraumatic Respiratory: chest rises symmetrically,  no increased work of breathing Extremities: Moves upper and lower extremities freely, mild nonpitting edema of left ankle and into lower shin, Foot: Inspection:  No obvious bony deformity b/l.  Swollen left ankle without erythema, or bruising  Palpation: Tenderness to palpation of lateral malleolus, navicular  ROM: Full  ROM of the ankle b/l without pain  strength: 5/5 strength ankle in all planes b/l Neurovascular: N/V intact distally in the lower extremity b/l  ASSESSMENT/PLAN:   Assessment & Plan Left foot pain Swelling and stiffness in the left ankle following a fall. No significant pain. Tenderness over some bony prominences. Likely ankle sprain, but need to rule out fracture. [place] ankle rules recommended imaging. -Order X-ray of the left ankle and foot -Advise RICE protocol:Rest, Ice, Compression (with Ace bandage provided), and Elevation. -Naproxen 500mg  twice daily with meals for  pain and inflammation control. -Follow-up imaging results   Levin Erp, MD [facility]

## 2023-06-05 NOTE — Patient Instructions (Signed)
It was great to see you! Thank you for allowing me to participate in your care!   Our plans for today:  - We will get an ankle and foot xray - Naproxen 500 twice daily for 5 days with meals - Ice the area - Wrap with ace bandage - Elevate at night time - Rest the ankle  Take care and seek immediate care sooner if you develop any concerns.  Levin Erp, MD

## 2023-06-26 ENCOUNTER — Encounter: Payer: Self-pay | Admitting: Family Medicine

## 2023-06-26 ENCOUNTER — Encounter: Payer: MEDICAID | Admitting: Family Medicine

## 2023-07-17 ENCOUNTER — Other Ambulatory Visit: Payer: Self-pay | Admitting: Medical Genetics

## 2023-07-24 ENCOUNTER — Ambulatory Visit: Payer: MEDICAID | Admitting: Family Medicine

## 2023-07-24 ENCOUNTER — Encounter: Payer: Self-pay | Admitting: Family Medicine

## 2023-12-30 ENCOUNTER — Encounter: Payer: Self-pay | Admitting: *Deleted

## 2024-04-30 ENCOUNTER — Other Ambulatory Visit: Payer: Self-pay | Admitting: Medical Genetics

## 2024-04-30 DIAGNOSIS — Z006 Encounter for examination for normal comparison and control in clinical research program: Secondary | ICD-10-CM

## 2024-05-29 LAB — GENECONNECT MOLECULAR SCREEN: Genetic Analysis Overall Interpretation: NEGATIVE
